# Patient Record
Sex: Female | Born: 1945
Health system: Southern US, Community
[De-identification: ages and names within clinical notes are randomized; demographics above are authoritative.]

## PROBLEM LIST (undated history)

## (undated) DIAGNOSIS — R32 Unspecified urinary incontinence: Secondary | ICD-10-CM

## (undated) DIAGNOSIS — R51 Headache: Secondary | ICD-10-CM

## (undated) DIAGNOSIS — M1990 Unspecified osteoarthritis, unspecified site: Secondary | ICD-10-CM

## (undated) DIAGNOSIS — M8580 Other specified disorders of bone density and structure, unspecified site: Secondary | ICD-10-CM

## (undated) DIAGNOSIS — H269 Unspecified cataract: Secondary | ICD-10-CM

## (undated) DIAGNOSIS — K5780 Diverticulitis of intestine, part unspecified, with perforation and abscess without bleeding: Secondary | ICD-10-CM

## (undated) DIAGNOSIS — K5790 Diverticulosis of intestine, part unspecified, without perforation or abscess without bleeding: Secondary | ICD-10-CM

## (undated) DIAGNOSIS — R2681 Unsteadiness on feet: Secondary | ICD-10-CM

## (undated) DIAGNOSIS — K429 Umbilical hernia without obstruction or gangrene: Secondary | ICD-10-CM

## (undated) HISTORY — DX: Other specified disorders of bone density and structure, unspecified site: M85.80

## (undated) HISTORY — DX: Unspecified urinary incontinence: R32

## (undated) HISTORY — PX: LUMBAR SPINE SURGERY: SHX701

## (undated) HISTORY — PX: CERVICAL SPINE SURGERY: SHX589

## (undated) HISTORY — DX: Unspecified cataract: H26.9

## (undated) HISTORY — DX: Unspecified osteoarthritis, unspecified site: M19.90

## (undated) HISTORY — PX: COLON SURGERY: SHX602

## (undated) HISTORY — DX: Diverticulosis of intestine, part unspecified, without perforation or abscess without bleeding: K57.90

---

## 2000-07-14 ENCOUNTER — Encounter: Payer: Self-pay | Admitting: Internal Medicine

## 2000-07-14 ENCOUNTER — Ambulatory Visit (HOSPITAL_COMMUNITY): Admission: RE | Admit: 2000-07-14 | Discharge: 2000-07-14 | Payer: Self-pay | Admitting: Internal Medicine

## 2001-07-07 ENCOUNTER — Ambulatory Visit (HOSPITAL_COMMUNITY): Admission: RE | Admit: 2001-07-07 | Discharge: 2001-07-07 | Payer: Self-pay | Admitting: Family Medicine

## 2001-07-07 ENCOUNTER — Encounter: Payer: Self-pay | Admitting: Family Medicine

## 2002-07-08 ENCOUNTER — Encounter: Payer: Self-pay | Admitting: Internal Medicine

## 2002-07-08 ENCOUNTER — Ambulatory Visit (HOSPITAL_COMMUNITY): Admission: RE | Admit: 2002-07-08 | Discharge: 2002-07-08 | Payer: Self-pay | Admitting: Internal Medicine

## 2002-08-16 ENCOUNTER — Ambulatory Visit (HOSPITAL_COMMUNITY): Admission: RE | Admit: 2002-08-16 | Discharge: 2002-08-16 | Payer: Self-pay | Admitting: Obstetrics and Gynecology

## 2002-08-16 ENCOUNTER — Encounter: Payer: Self-pay | Admitting: Obstetrics and Gynecology

## 2003-08-23 ENCOUNTER — Ambulatory Visit (HOSPITAL_COMMUNITY): Admission: RE | Admit: 2003-08-23 | Discharge: 2003-08-23 | Payer: Self-pay | Admitting: Obstetrics and Gynecology

## 2004-01-22 DIAGNOSIS — K5790 Diverticulosis of intestine, part unspecified, without perforation or abscess without bleeding: Secondary | ICD-10-CM

## 2004-01-22 HISTORY — DX: Diverticulosis of intestine, part unspecified, without perforation or abscess without bleeding: K57.90

## 2004-08-31 ENCOUNTER — Ambulatory Visit (HOSPITAL_COMMUNITY): Admission: RE | Admit: 2004-08-31 | Discharge: 2004-08-31 | Payer: Self-pay | Admitting: Internal Medicine

## 2004-08-31 ENCOUNTER — Ambulatory Visit: Payer: Self-pay | Admitting: Internal Medicine

## 2004-09-07 ENCOUNTER — Ambulatory Visit (HOSPITAL_COMMUNITY): Admission: RE | Admit: 2004-09-07 | Discharge: 2004-09-07 | Payer: Self-pay | Admitting: Internal Medicine

## 2005-10-18 ENCOUNTER — Ambulatory Visit (HOSPITAL_COMMUNITY): Admission: RE | Admit: 2005-10-18 | Discharge: 2005-10-18 | Payer: Self-pay | Admitting: Obstetrics and Gynecology

## 2006-11-07 ENCOUNTER — Ambulatory Visit (HOSPITAL_COMMUNITY): Admission: RE | Admit: 2006-11-07 | Discharge: 2006-11-07 | Payer: Self-pay | Admitting: Obstetrics and Gynecology

## 2007-11-10 ENCOUNTER — Ambulatory Visit (HOSPITAL_COMMUNITY): Admission: RE | Admit: 2007-11-10 | Discharge: 2007-11-10 | Payer: Self-pay | Admitting: Internal Medicine

## 2008-11-15 ENCOUNTER — Ambulatory Visit (HOSPITAL_COMMUNITY): Admission: RE | Admit: 2008-11-15 | Discharge: 2008-11-15 | Payer: Self-pay | Admitting: Internal Medicine

## 2009-11-16 ENCOUNTER — Ambulatory Visit (HOSPITAL_COMMUNITY): Admission: RE | Admit: 2009-11-16 | Discharge: 2009-11-16 | Payer: Self-pay | Admitting: Obstetrics and Gynecology

## 2010-06-08 NOTE — Op Note (Signed)
NAMESHAMERIA, TRIMARCO                  ACCOUNT NO.:  [id number]   MEDICAL RECORD NO.:  [id number]          PATIENT TYPE:  AMB   LOCATION:  DAY                           FACILITY:  [facility]   PHYSICIAN:  Lionel December, M.D.    DATE OF BIRTH:  03-19-1945   DATE OF PROCEDURE:  08/31/2004  DATE OF DISCHARGE:                                 OPERATIVE REPORT   PROCEDURE:  Colonoscopy.   INDICATION:  Anuja is a 65 year old Caucasian female who is undergoing  screening colonoscopy. Family history is negative for colorectal carcinoma.   Procedure risks were reviewed with the patient, and informed consent was  obtained.   PREMEDICATION:  Demerol 50 mg IV, Versed 7 mg IV in divided dose.   FINDINGS:  Procedure performed in endoscopy suite. The patient's vital signs  and O2 saturation were monitored during the procedure and remained stable.  The patient was placed in left lateral position. Rectal examination  performed. No abnormality noted on external or digital exam. Olympus  videoscope was placed in the rectum and advanced under vision into sigmoid  colon and beyond. Preparation was excellent. Somewhat redundant sigmoid  colon with few small diverticula. The patient had to be turned on her back.  Using abdominal pressure, I was able to pass the scope into splenic flexure  and beyond. Further intubation of the cecum was easy. Cecal landmarks, i.e.  appendiceal orifice, ileocecal valve were well seen and pictures taken for  the record. As the scope was withdrawn, colonic mucosa was examined for the  second time. There was a single diverticulum at ascending colon as well. No  polyps and/or tumor masses were noted. Rectal mucosa was normal. Scope was  retroflexed to examine anorectal junction which was unremarkable. Endoscope  was straightened and withdrawn. The patient tolerated the procedure well.   FINAL DIAGNOSIS:  Few diverticula at sigmoid colon along with one at  ascending colon, otherwise  normal exam.   RECOMMENDATIONS:  1.  High-fiber diet. She may take fiber supplement such as FiberChoice 2      tablets q.d. if she does not need foods rich in fiber.  2.  Yearly Hemoccults.  3.  Screening exam in 10 years.      NR/MEDQ  D:  08/31/2004  T:  08/31/2004  Job:  [id number]

## 2010-12-04 ENCOUNTER — Other Ambulatory Visit (HOSPITAL_COMMUNITY): Payer: Self-pay | Admitting: Internal Medicine

## 2010-12-04 DIAGNOSIS — Z139 Encounter for screening, unspecified: Secondary | ICD-10-CM

## 2010-12-17 ENCOUNTER — Ambulatory Visit (HOSPITAL_COMMUNITY)
Admission: RE | Admit: 2010-12-17 | Discharge: 2010-12-17 | Disposition: A | Discharge status: Home / Self Care | Payer: Medicare Other | Source: Ambulatory Visit | Attending: Internal Medicine | Admitting: Internal Medicine

## 2010-12-17 DIAGNOSIS — Z1231 Encounter for screening mammogram for malignant neoplasm of breast: Secondary | ICD-10-CM | POA: Insufficient documentation

## 2010-12-17 DIAGNOSIS — Z139 Encounter for screening, unspecified: Secondary | ICD-10-CM

## 2010-12-20 ENCOUNTER — Encounter: Payer: Self-pay | Admitting: *Deleted

## 2010-12-20 ENCOUNTER — Ambulatory Visit (INDEPENDENT_AMBULATORY_CARE_PROVIDER_SITE_OTHER): Payer: Medicare Other | Admitting: Gynecology

## 2010-12-20 VITALS — BP 120/74 | Ht 63.5 in | Wt 147.0 lb

## 2010-12-20 DIAGNOSIS — N393 Stress incontinence (female) (male): Secondary | ICD-10-CM

## 2010-12-20 DIAGNOSIS — K5792 Diverticulitis of intestine, part unspecified, without perforation or abscess without bleeding: Secondary | ICD-10-CM | POA: Insufficient documentation

## 2010-12-20 DIAGNOSIS — N952 Postmenopausal atrophic vaginitis: Secondary | ICD-10-CM

## 2010-12-20 DIAGNOSIS — R82998 Other abnormal findings in urine: Secondary | ICD-10-CM

## 2010-12-20 NOTE — Progress Notes (Signed)
Addended byCammie Mcgee T on: 12/20/2010 11:55 AM   Modules accepted: Orders

## 2010-12-20 NOTE — Patient Instructions (Signed)
Follow up on an annual basis

## 2010-12-20 NOTE — Progress Notes (Signed)
Emma Henderson [birth year] [id number]        65 y.o.  for follow up. Former patient of Dr. Leota Sauers. She is complaining of some SUI symptoms.  Past medical history,surgical history, medications, allergies, family history and social history were all reviewed and documented in the EPIC chart. ROS:  Was performed and pertinent positives and negatives are included in the history.  Exam: chaperone present Filed Vitals:   12/20/10 1000  BP: 120/74   General appearance  Normal Skin grossly normal Head/Neck normal with no cervical or supraclavicular adenopathy thyroid normal Lungs  clear Cardiac RR, without RMG Abdominal  soft, nontender, without masses, organomegaly or hernia Breasts  examined lying and sitting without masses, retractions, discharge or axillary adenopathy. Pelvic  Ext/BUS/vagina  normal with atrophic genital changes noted  Cervix  normal  Atrophic in appearance  Uterus  axial, normal size, shape and contour, midline and mobile nontender   Adnexa  Without masses or tenderness    Anus and perineum  normal   Rectovaginal  normal sphincter tone without palpated masses or tenderness.    Assessment/Plan:  65 y.o. female for annual exam.    1. SUI. Patient has mild SUI symptoms. I discussed Kegel exercises and the options for surgery such as sling.  Patient is not interested in surgery she said it's not that bad she will try Kegel exercises. 2. Atrophic vaginal changes. Patient is asymptomatic from this. She is not sexually active and will continue to monitor. 3. Pap smear. Patient has no history of abnormal Pap smears with multiple normal reports in her chart. Last Pap smear December 2011. Discussed current screening recommendations. She is 65 and we can stop altogether versus doing a less frequent screening interval.  We will readdress this on an annual basis but no Pap smear was done today. 4. Breast health. SBE monthly reviewed. Patient had mammography this week and was  reportedly normal. We'll continue with annual mammography. 5. Bone health. Patient had a DEXA over 5 years ago recommended repeat now I gave her an order prescription to have it done at [facility] at her choice.  Increased calcium vitamin D reviewed. 6. Colonoscopy. Patient had colonoscopy about 6 years ago will follow up with gastroenterology in reference to this.  7. Health maintenance. Patient has a primary although has not seen him in some time. I recommended she make an appointment for a primary checkup and for screening lab work per their recommendations. No lab work was done today. Assuming she continues well from a gynecologic standpoint she will see me in a year sooner as needed.   [name], 11:14 AM 12/20/2010

## 2010-12-21 MED ORDER — SULFAMETHOXAZOLE-TRIMETHOPRIM 800-160 MG PO TABS: 1.0000 | ORAL_TABLET | Freq: Two times a day (BID) | ORAL | Status: AC

## 2010-12-21 NOTE — Progress Notes (Signed)
Addended by: Dara Lords on: 12/21/2010 12:27 PM   Modules accepted: Orders

## 2010-12-24 ENCOUNTER — Encounter: Payer: Self-pay | Admitting: Gynecology

## 2011-01-17 ENCOUNTER — Other Ambulatory Visit: Payer: Self-pay | Admitting: Gynecology

## 2011-01-17 DIAGNOSIS — Z780 Asymptomatic menopausal state: Secondary | ICD-10-CM

## 2011-01-23 ENCOUNTER — Ambulatory Visit (HOSPITAL_COMMUNITY)
Admission: RE | Admit: 2011-01-23 | Discharge: 2011-01-23 | Disposition: A | Discharge status: Home / Self Care | Payer: Medicare Other | Source: Ambulatory Visit | Attending: Gynecology | Admitting: Gynecology

## 2011-01-23 DIAGNOSIS — Z780 Asymptomatic menopausal state: Secondary | ICD-10-CM

## 2011-01-23 DIAGNOSIS — M949 Disorder of cartilage, unspecified: Secondary | ICD-10-CM | POA: Insufficient documentation

## 2011-01-23 DIAGNOSIS — M899 Disorder of bone, unspecified: Secondary | ICD-10-CM | POA: Insufficient documentation

## 2011-01-28 ENCOUNTER — Encounter: Payer: Self-pay | Admitting: Gynecology

## 2011-02-06 ENCOUNTER — Encounter: Payer: Self-pay | Admitting: Gynecology

## 2011-10-31 ENCOUNTER — Other Ambulatory Visit (HOSPITAL_COMMUNITY): Payer: Self-pay | Admitting: Internal Medicine

## 2011-10-31 DIAGNOSIS — R2689 Other abnormalities of gait and mobility: Secondary | ICD-10-CM

## 2011-10-31 DIAGNOSIS — W19XXXA Unspecified fall, initial encounter: Secondary | ICD-10-CM

## 2011-11-04 ENCOUNTER — Ambulatory Visit (HOSPITAL_COMMUNITY)
Admission: RE | Admit: 2011-11-04 | Discharge: 2011-11-04 | Disposition: A | Discharge status: Home / Self Care | Payer: Medicare Other | Source: Ambulatory Visit | Attending: Internal Medicine | Admitting: Internal Medicine

## 2011-11-04 DIAGNOSIS — W19XXXA Unspecified fall, initial encounter: Secondary | ICD-10-CM

## 2011-11-04 DIAGNOSIS — R2689 Other abnormalities of gait and mobility: Secondary | ICD-10-CM

## 2011-11-04 DIAGNOSIS — R269 Unspecified abnormalities of gait and mobility: Secondary | ICD-10-CM | POA: Insufficient documentation

## 2011-11-04 DIAGNOSIS — R42 Dizziness and giddiness: Secondary | ICD-10-CM | POA: Insufficient documentation

## 2011-11-04 DIAGNOSIS — Z9181 History of falling: Secondary | ICD-10-CM | POA: Insufficient documentation

## 2011-11-25 ENCOUNTER — Other Ambulatory Visit (HOSPITAL_COMMUNITY): Payer: Self-pay | Admitting: Internal Medicine

## 2011-11-25 DIAGNOSIS — Z139 Encounter for screening, unspecified: Secondary | ICD-10-CM

## 2011-12-23 ENCOUNTER — Ambulatory Visit (HOSPITAL_COMMUNITY)
Admission: RE | Admit: 2011-12-23 | Discharge: 2011-12-23 | Disposition: A | Discharge status: Home / Self Care | Payer: Medicare Other | Source: Ambulatory Visit | Attending: Internal Medicine | Admitting: Internal Medicine

## 2011-12-23 DIAGNOSIS — Z1231 Encounter for screening mammogram for malignant neoplasm of breast: Secondary | ICD-10-CM | POA: Insufficient documentation

## 2011-12-23 DIAGNOSIS — Z139 Encounter for screening, unspecified: Secondary | ICD-10-CM

## 2012-02-04 ENCOUNTER — Encounter: Payer: Self-pay | Admitting: Gynecology

## 2012-02-04 ENCOUNTER — Ambulatory Visit (INDEPENDENT_AMBULATORY_CARE_PROVIDER_SITE_OTHER): Payer: Medicare Other | Admitting: Gynecology

## 2012-02-04 DIAGNOSIS — N898 Other specified noninflammatory disorders of vagina: Secondary | ICD-10-CM

## 2012-02-04 DIAGNOSIS — L293 Anogenital pruritus, unspecified: Secondary | ICD-10-CM

## 2012-02-04 DIAGNOSIS — L292 Pruritus vulvae: Secondary | ICD-10-CM

## 2012-02-04 DIAGNOSIS — R300 Dysuria: Secondary | ICD-10-CM

## 2012-02-04 LAB — URINALYSIS W MICROSCOPIC + REFLEX CULTURE
Bilirubin Urine: NEGATIVE
Casts: NONE SEEN
Crystals: NONE SEEN
Hgb urine dipstick: NEGATIVE
Ketones, ur: NEGATIVE mg/dL
Nitrite: NEGATIVE
Specific Gravity, Urine: >1.03 — ABNORMAL HIGH (ref 1.005–1.030)
pH: 5 (ref 5.0–8.0)

## 2012-02-04 LAB — WET PREP FOR TRICH, YEAST, CLUE: Yeast Wet Prep HPF POC: NONE SEEN

## 2012-02-04 MED ORDER — NITROFURANTOIN MONOHYD MACRO 100 MG PO CAPS: 100.0000 mg | ORAL_CAPSULE | Freq: Two times a day (BID) | ORAL | Status: DC

## 2012-02-04 MED ORDER — FLUCONAZOLE 150 MG PO TABS: 150.0000 mg | ORAL_TABLET | Freq: Once | ORAL | Status: DC

## 2012-02-04 NOTE — Patient Instructions (Signed)
Take antibiotics as prescribed.

## 2012-02-04 NOTE — Progress Notes (Signed)
Patient presents with some slight burning and frequency with urination. Also some slight vaginal itching. Does have history of UTIs in the past. Also notes some urgency symptoms and stress urinary incontinence type symptoms.  Exam with kim assistant Abdomen soft nontender without masses guarding rebound organomegaly. Pelvic external BUS vagina with atrophic changes. Mild cystocele noted. Cervix normal with mild to sent. Uterus grossly normal size midline mobile nontender. Adnexa without masses or tenderness.  Assessment and plan: 1. Slight UTI symptoms. Urinalysis consistent with mild UTI. Had been treated previously at primary with 5 day course of antibiotics. We'll treat with Macrobid 100 mg twice a day x7 days. Follow up at her scheduled annual exam in 2 weeks. 2. Vaginal itching.  Wet prep unremarkable. Will cover with Diflucan 150 mg x1 dose. Follow up if symptoms persist or recur otherwise at her annual exam in 2 weeks.

## 2012-02-06 LAB — URINE CULTURE: Colony Count: 6000

## 2012-02-20 ENCOUNTER — Encounter: Payer: Self-pay | Admitting: Gynecology

## 2012-02-20 ENCOUNTER — Ambulatory Visit (INDEPENDENT_AMBULATORY_CARE_PROVIDER_SITE_OTHER): Payer: Medicare Other | Admitting: Gynecology

## 2012-02-20 ENCOUNTER — Other Ambulatory Visit (HOSPITAL_COMMUNITY)
Admission: RE | Admit: 2012-02-20 | Discharge: 2012-02-20 | Disposition: A | Discharge status: Home / Self Care | Payer: Medicare Other | Source: Ambulatory Visit | Attending: Gynecology | Admitting: Gynecology

## 2012-02-20 VITALS — BP 120/78 | Ht 63.0 in | Wt 148.0 lb

## 2012-02-20 DIAGNOSIS — M899 Disorder of bone, unspecified: Secondary | ICD-10-CM

## 2012-02-20 DIAGNOSIS — Z124 Encounter for screening for malignant neoplasm of cervix: Secondary | ICD-10-CM

## 2012-02-20 DIAGNOSIS — M8580 Other specified disorders of bone density and structure, unspecified site: Secondary | ICD-10-CM

## 2012-02-20 DIAGNOSIS — N952 Postmenopausal atrophic vaginitis: Secondary | ICD-10-CM

## 2012-02-20 DIAGNOSIS — Z01419 Encounter for gynecological examination (general) (routine) without abnormal findings: Secondary | ICD-10-CM | POA: Insufficient documentation

## 2012-02-20 DIAGNOSIS — Z1151 Encounter for screening for human papillomavirus (HPV): Secondary | ICD-10-CM | POA: Insufficient documentation

## 2012-02-20 NOTE — Patient Instructions (Signed)
Follow-up in one year.

## 2012-02-20 NOTE — Progress Notes (Signed)
Emma Henderson [birth year] [id number]        67 y.o.  [id number] for follow up exam.  Several issues noted below.  Past medical history,surgical history, medications, allergies, family history and social history were all reviewed and documented in the EPIC chart. ROS:  Was performed and pertinent positives and negatives are included in the history.  Exam: Kim assistant Filed Vitals:   02/20/12 1109  BP: 120/78  Height: 5\' 3"  (1.6 m)  Weight: 148 lb (67.132 kg)   General appearance  Normal Skin grossly normal Head/Neck normal with no cervical or supraclavicular adenopathy thyroid normal Lungs  clear Cardiac RR, without RMG Abdominal  soft, nontender, without masses, organomegaly or hernia Breasts  examined lying and sitting without masses, retractions, discharge or axillary adenopathy. Pelvic  Ext/BUS/vagina  normal with atrophic changes  Cervix  normal stenotic os with atrophic changes. Pap/HPV  Uterus  anteverted, normal size, shape and contour, midline and mobile nontender   Adnexa  Without masses or tenderness    Anus and perineum  normal   Rectovaginal  normal sphincter tone without palpated masses or tenderness.    Assessment/Plan:  67 y.o. [id number] female for follow up exam.   1. Postmenopausal/atrophic genital changes. Patient without significant symptoms to include hot flashes sweats or vaginal symptoms. No bleeding at all. We'll continue to monitor. She knows to report any bleeding or other symptoms. 2. Osteopenia. DEXA 01/2011 with T score -1.3 FRAX 8.6%/0.8%. Reviewed calcium/vitamin D recommendations. Increase weightbearing exercise. Repeat DEXA next year at two-year interval. 3. Mammography 12/2011. Continue with annual mammography. SBE monthly reviewed. 4. Pap smear 2011. Pap/HPV today. No history of abnormal Pap smears. Discussed option to stop screening all together if this is normal as she is at age 48 versus less frequent screening interval and we will readdress on an  annual basis. 5. Colonoscopy 7 years ago. Due to repeat a 10 year interval. 6. Health maintenance. No blood work done as it is all done through her primary physician's office who she sees her on a regular basis.    [name], 11:58 AM 02/20/2012

## 2012-02-21 LAB — URINALYSIS W MICROSCOPIC + REFLEX CULTURE
Crystals: NONE SEEN
Glucose, UA: NEGATIVE mg/dL
Ketones, ur: NEGATIVE mg/dL
Protein, ur: NEGATIVE mg/dL

## 2012-06-19 ENCOUNTER — Emergency Department (HOSPITAL_COMMUNITY): Payer: Medicare Other

## 2012-06-19 ENCOUNTER — Encounter (HOSPITAL_COMMUNITY): Payer: Self-pay | Admitting: *Deleted

## 2012-06-19 ENCOUNTER — Inpatient Hospital Stay (HOSPITAL_COMMUNITY)
Admission: EM | Admit: 2012-06-19 | Discharge: 2012-06-23 | DRG: 392 | Disposition: A | Discharge status: Home / Self Care | Payer: Medicare Other | Attending: General Surgery | Admitting: General Surgery

## 2012-06-19 DIAGNOSIS — K630 Abscess of intestine: Secondary | ICD-10-CM | POA: Diagnosis present

## 2012-06-19 DIAGNOSIS — M899 Disorder of bone, unspecified: Secondary | ICD-10-CM | POA: Diagnosis present

## 2012-06-19 DIAGNOSIS — D72829 Elevated white blood cell count, unspecified: Secondary | ICD-10-CM | POA: Diagnosis present

## 2012-06-19 DIAGNOSIS — K5732 Diverticulitis of large intestine without perforation or abscess without bleeding: Principal | ICD-10-CM | POA: Diagnosis present

## 2012-06-19 DIAGNOSIS — Z87891 Personal history of nicotine dependence: Secondary | ICD-10-CM

## 2012-06-19 DIAGNOSIS — Z823 Family history of stroke: Secondary | ICD-10-CM

## 2012-06-19 DIAGNOSIS — K5780 Diverticulitis of intestine, part unspecified, with perforation and abscess without bleeding: Secondary | ICD-10-CM

## 2012-06-19 DIAGNOSIS — Z8249 Family history of ischemic heart disease and other diseases of the circulatory system: Secondary | ICD-10-CM

## 2012-06-19 DIAGNOSIS — K429 Umbilical hernia without obstruction or gangrene: Secondary | ICD-10-CM | POA: Diagnosis present

## 2012-06-19 LAB — COMPREHENSIVE METABOLIC PANEL
ALT: 12 U/L (ref 0–35)
AST: 16 U/L (ref 0–37)
Albumin: 3.6 g/dL (ref 3.5–5.2)
CO2: 24 mEq/L (ref 19–32)
Calcium: 9.4 mg/dL (ref 8.4–10.5)
Chloride: 102 mEq/L (ref 96–112)
Creatinine, Ser: 0.94 mg/dL (ref 0.50–1.10)
GFR calc Af Amer: 72 mL/min — ABNORMAL LOW (ref >90)
GFR calc non Af Amer: 62 mL/min — ABNORMAL LOW (ref >90)
Sodium: 139 mEq/L (ref 135–145)
Total Protein: 7.5 g/dL (ref 6.0–8.3)

## 2012-06-19 LAB — URINALYSIS, ROUTINE W REFLEX MICROSCOPIC
Ketones, ur: NEGATIVE mg/dL
Specific Gravity, Urine: >1.03 — ABNORMAL HIGH (ref 1.005–1.030)
pH: 6 (ref 5.0–8.0)

## 2012-06-19 LAB — CBC
Hemoglobin: 12.8 g/dL (ref 12.0–15.0)
MCH: 28.8 pg (ref 26.0–34.0)
MCHC: 33.3 g/dL (ref 30.0–36.0)
MCV: 86.3 fL (ref 78.0–100.0)
RDW: 14 % (ref 11.5–15.5)

## 2012-06-19 LAB — URINE MICROSCOPIC-ADD ON

## 2012-06-19 MED ORDER — FENTANYL CITRATE 0.05 MG/ML IJ SOLN
50.0000 ug | Freq: Once | INTRAMUSCULAR | Status: AC
  Administered 2012-06-19: 50 ug via INTRAVENOUS
  Filled 2012-06-19: qty 2

## 2012-06-19 MED ORDER — IOHEXOL 300 MG/ML  SOLN: 100.0000 mL | Freq: Once | INTRAMUSCULAR | Status: AC | PRN

## 2012-06-19 MED ORDER — ONDANSETRON HCL 4 MG/2ML IJ SOLN
4.0000 mg | Freq: Once | INTRAMUSCULAR | Status: AC
  Administered 2012-06-19: 4 mg via INTRAVENOUS
  Filled 2012-06-19: qty 2

## 2012-06-19 MED ORDER — IOHEXOL 300 MG/ML  SOLN
50.0000 mL | Freq: Once | INTRAMUSCULAR | Status: AC | PRN
  Administered 2012-06-19: 50 mL via ORAL

## 2012-06-19 MED ORDER — ACETAMINOPHEN 325 MG PO TABS
650.0000 mg | ORAL_TABLET | Freq: Once | ORAL | Status: AC
  Administered 2012-06-19: 650 mg via ORAL
  Filled 2012-06-19: qty 2

## 2012-06-19 MED ORDER — SODIUM CHLORIDE 0.9 % IV SOLN
INTRAVENOUS | Status: DC
  Administered 2012-06-19: 23:00:00 via INTRAVENOUS

## 2012-06-19 NOTE — ED Provider Notes (Signed)
History  This chart was scribed for Sunnie Nielsen, MD, by Candelaria Stagers, ED Scribe. This patient was seen in room [facility] and the patient's care was started at 11:10 PM   CSN: [id number]  Arrival date & time 06/19/12  2235   First MD Initiated Contact with Patient 06/19/12 2256      Chief Complaint  Patient presents with  . Abdominal Pain    The history is provided by the patient. No language interpreter was used.   HPI Comments: Emma Henderson is a 67 y.o. female who presents to the Emergency Department complaining of diffuse abdominal pain that started two days ago and has gradually worsened.  She denies nausea, vomiting, SOB, or chest pain.  She has also experienced diarrhea, fever, and decreased urination.   Her fever in the ED is 100.6.  Pt denies recent travel.  She has taken tylenol with some relief.      Past Medical History  Diagnosis Date  . Diverticulitis 2006  . Osteopenia 01/2011    t-score -1.3, FRAX 8.6 % & 0.8%    Past Surgical History  Procedure Laterality Date  . Cervical spine surgery    . Lumbar spine surgery      Family History  Problem Relation Age of Onset  . Heart disease Mother     heart attacks died with second one  . Stroke Father     died of a stroke  . Heart disease Father     had two MI's    History  Substance Use Topics  . Smoking status: Former Games developer  . Smokeless tobacco: Never Used     Comment: stopped at age 60  . Alcohol Use: 3.5 oz/week    7 drink(s) per week    OB History   Grav Para Term Preterm Abortions TAB SAB Ect Mult Living   2 2 2       2       Review of Systems  Constitutional: Positive for fever.  Respiratory: Negative for shortness of breath.   Cardiovascular: Negative for chest pain.  Gastrointestinal: Positive for abdominal pain and diarrhea. Negative for nausea, vomiting and blood in stool.  All other systems reviewed and are negative.    Allergies  Review of patient's allergies indicates no known  allergies.  Home Medications   Current Outpatient Rx  Name  Route  Sig  Dispense  Refill  . acetaminophen (TYLENOL) 500 MG tablet   Oral   Take 500 mg by mouth every 6 (six) hours as needed for pain.         . Calcium Carbonate-Vitamin D (CALCARB 600/D PO)   Oral   Take 1 tablet by mouth daily.           BP 142/66  Pulse 117  Temp(Src) 100.6 F (38.1 C) (Oral)  Resp 16  Ht 5\' 3"  (1.6 m)  Wt 150 lb (68.04 kg)  BMI 26.58 kg/m2  SpO2 99%  LMP 01/21/1998  Physical Exam  Nursing note and vitals reviewed. Constitutional: She is oriented to person, place, and time. She appears well-developed and well-nourished. No distress.  HENT:  Head: Normocephalic and atraumatic.  Dry mucous membranes  Eyes: EOM are normal.  Neck: Neck supple. No tracheal deviation present.  Cardiovascular:  tachycardic  Pulmonary/Chest: Effort normal. No respiratory distress.  Abdominal: Soft. There is tenderness (LLQ tenderness and mid abdomen).  Musculoskeletal: Normal range of motion.  Neurological: She is alert and oriented to person, place, and time.  Skin: Skin is warm and dry. She is not diaphoretic.  Psychiatric: She has a normal mood and affect. Her behavior is normal.    ED Course  Procedures   DIAGNOSTIC STUDIES: Oxygen Saturation is 99% on room air, normal by my interpretation.    COORDINATION OF CARE:  11:14 PM Discussed course of care with pt which includes abdomen CT and pain medication.  Pt understands and agrees.  Results for orders placed during the hospital encounter of 06/19/12  URINALYSIS, ROUTINE W REFLEX MICROSCOPIC      Result Value Range   Color, Urine YELLOW  YELLOW   APPearance HAZY (*) CLEAR   Specific Gravity, Urine >1.030 (*) 1.005 - 1.030   pH 6.0  5.0 - 8.0   Glucose, UA NEGATIVE  NEGATIVE mg/dL   Hgb urine dipstick MODERATE (*) NEGATIVE   Bilirubin Urine SMALL (*) NEGATIVE   Ketones, ur NEGATIVE  NEGATIVE mg/dL   Protein, ur TRACE (*) NEGATIVE mg/dL    Urobilinogen, UA 0.2  0.0 - 1.0 mg/dL   Nitrite NEGATIVE  NEGATIVE   Leukocytes, UA TRACE (*) NEGATIVE  CBC      Result Value Range   WBC 11.8 (*) 4.0 - 10.5 K/uL   RBC 4.45  3.87 - 5.11 MIL/uL   Hemoglobin 12.8  12.0 - 15.0 g/dL   HCT 47.8  29.5 - 62.1 %   MCV 86.3  78.0 - 100.0 fL   MCH 28.8  26.0 - 34.0 pg   MCHC 33.3  30.0 - 36.0 g/dL   RDW 30.8  65.7 - 84.6 %   Platelets 253  150 - 400 K/uL  COMPREHENSIVE METABOLIC PANEL      Result Value Range   Sodium 139  135 - 145 mEq/L   Potassium 3.3 (*) 3.5 - 5.1 mEq/L   Chloride 102  96 - 112 mEq/L   CO2 24  19 - 32 mEq/L   Glucose, Bld 178 (*) 70 - 99 mg/dL   BUN 13  6 - 23 mg/dL   Creatinine, Ser 9.62  0.50 - 1.10 mg/dL   Calcium 9.4  8.4 - 95.2 mg/dL   Total Protein 7.5  6.0 - 8.3 g/dL   Albumin 3.6  3.5 - 5.2 g/dL   AST 16  0 - 37 U/L   ALT 12  0 - 35 U/L   Alkaline Phosphatase 73  39 - 117 U/L   Total Bilirubin 0.8  0.3 - 1.2 mg/dL   GFR calc non Af Amer 62 (*) >90 mL/min   GFR calc Af Amer 72 (*) >90 mL/min  LIPASE, BLOOD      Result Value Range   Lipase 20  11 - 59 U/L  URINE MICROSCOPIC-ADD ON      Result Value Range   Squamous Epithelial / LPF FEW (*) RARE   WBC, UA 21-50  <3 WBC/hpf   Bacteria, UA FEW (*) RARE   Ct Abdomen Pelvis W Contrast  06/20/2012   *RADIOLOGY REPORT*  Clinical Data: Left lower quadrant pain.  Fever.  CT ABDOMEN AND PELVIS WITH CONTRAST  Technique:  Multidetector CT imaging of the abdomen and pelvis was performed following the standard protocol during bolus administration of intravenous contrast.  Contrast: 50mL OMNIPAQUE IOHEXOL 300 MG/ML  SOLN, OMNIPAQUE IOHEXOL 300 MG/ML  SOLN  Comparison: None.  Findings: A small amount of free intraperitoneal air is seen.  Mild diverticulitis is seen involving the mid to distal sigmoid colon. There is an extraluminal  fluid and gas collection in the right pelvis measuring 2.9 x 4.3 cm, consistent with a diverticular abscess.  Normal appendix is  visualized.  No other abscess identified.  The liver, gallbladder, pancreas, spleen, adrenal glands, and kidneys are normal in appearance.  No evidence of hydronephrosis. No soft tissue masses or lymphadenopathy identified.  IMPRESSION:  1.  Sigmoid diverticulitis, with diverticular abscess in the right pelvis measuring 4.3 cm. 2.  Free intraperitoneal air, consistent with perforated diverticulitis.  Critical Value/emergent results were called by telephone at the time of interpretation on 06/20/2012 at 0130 hours to Dr. Dierdre Highman in the emergency department, who verbally acknowledged these results.   Original Report Authenticated By: Myles Rosenthal, M.D.   IV fentanyl.   1:32 AM CT results d/w RAD, [facility] consulted. NPO IVFs.   DR Leticia Penna to admit, recs IVF bolus, IV Zosyn, possible OR in am MDM  ABD pain/ fever with perforated diverticulitis/ 4cm abscess  Labs, CT, IVfs, IV narcotics.  [facility] admit   I personally performed the services described in this documentation, which was scribed in my presence. The recorded information has been reviewed and is accurate.         Sunnie Nielsen, MD 06/20/12 0200

## 2012-06-19 NOTE — ED Notes (Signed)
abd pain, nausea,no vomiting, sl diarrhea. Alert, NAD

## 2012-06-20 MED ORDER — PIPERACILLIN-TAZOBACTAM 3.375 G IVPB
3.3750 g | Freq: Three times a day (TID) | INTRAVENOUS | Status: DC
  Administered 2012-06-20 – 2012-06-23 (×10): 3.375 g via INTRAVENOUS
  Filled 2012-06-20 (×14): qty 50

## 2012-06-20 MED ORDER — LACTATED RINGERS IV SOLN
INTRAVENOUS | Status: DC
  Administered 2012-06-20 – 2012-06-23 (×5): via INTRAVENOUS

## 2012-06-20 MED ORDER — PIPERACILLIN-TAZOBACTAM 3.375 G IVPB
3.3750 g | Freq: Once | INTRAVENOUS | Status: AC
  Administered 2012-06-20: 3.375 g via INTRAVENOUS
  Filled 2012-06-20: qty 50

## 2012-06-20 MED ORDER — PANTOPRAZOLE SODIUM 40 MG IV SOLR
40.0000 mg | Freq: Every day | INTRAVENOUS | Status: DC
  Administered 2012-06-21 – 2012-06-22 (×3): 40 mg via INTRAVENOUS
  Filled 2012-06-20 (×3): qty 40

## 2012-06-20 MED ORDER — POTASSIUM CHLORIDE CRYS ER 20 MEQ PO TBCR
40.0000 meq | EXTENDED_RELEASE_TABLET | Freq: Once | ORAL | Status: AC
  Administered 2012-06-20: 40 meq via ORAL
  Filled 2012-06-20: qty 2

## 2012-06-20 MED ORDER — SODIUM CHLORIDE 0.9 % IV SOLN: INTRAVENOUS | Status: DC

## 2012-06-20 MED ORDER — ONDANSETRON HCL 4 MG/2ML IJ SOLN: 4.0000 mg | Freq: Three times a day (TID) | INTRAMUSCULAR | Status: DC | PRN

## 2012-06-20 MED ORDER — ONDANSETRON HCL 4 MG/2ML IJ SOLN
4.0000 mg | Freq: Four times a day (QID) | INTRAMUSCULAR | Status: DC | PRN
  Administered 2012-06-20 – 2012-06-21 (×2): 4 mg via INTRAVENOUS
  Filled 2012-06-20 (×2): qty 2

## 2012-06-20 MED ORDER — IOHEXOL 300 MG/ML  SOLN
100.0000 mL | Freq: Once | INTRAMUSCULAR | Status: AC | PRN
  Administered 2012-06-20: 100 mL via INTRAVENOUS

## 2012-06-20 MED ORDER — ENOXAPARIN SODIUM 40 MG/0.4ML ~~LOC~~ SOLN
40.0000 mg | SUBCUTANEOUS | Status: DC
Start: 2012-06-20 — End: 2012-06-23
  Administered 2012-06-20 – 2012-06-22 (×3): 40 mg via SUBCUTANEOUS
  Filled 2012-06-20 (×4): qty 0.4

## 2012-06-20 MED ORDER — SODIUM CHLORIDE 0.9 % IV SOLN: INTRAVENOUS | Status: AC

## 2012-06-20 MED ORDER — SODIUM CHLORIDE 0.9 % IV BOLUS (SEPSIS)
1000.0000 mL | Freq: Once | INTRAVENOUS | Status: AC
  Administered 2012-06-20: 1000 mL via INTRAVENOUS

## 2012-06-20 MED ORDER — FENTANYL CITRATE 0.05 MG/ML IJ SOLN
50.0000 ug | Freq: Once | INTRAMUSCULAR | Status: AC
  Administered 2012-06-20: 50 ug via INTRAVENOUS
  Filled 2012-06-20: qty 2

## 2012-06-20 MED ORDER — HYDROMORPHONE HCL PF 1 MG/ML IJ SOLN
1.0000 mg | INTRAMUSCULAR | Status: DC | PRN
  Administered 2012-06-20: 1 mg via INTRAVENOUS
  Filled 2012-06-20: qty 1

## 2012-06-20 MED ORDER — HYDROMORPHONE HCL PF 1 MG/ML IJ SOLN
1.0000 mg | INTRAMUSCULAR | Status: DC | PRN
  Administered 2012-06-20 – 2012-06-22 (×6): 1 mg via INTRAVENOUS
  Filled 2012-06-20: qty 2
  Filled 2012-06-20 (×5): qty 1

## 2012-06-20 MED ORDER — CIPROFLOXACIN HCL 250 MG PO TABS
500.0000 mg | ORAL_TABLET | Freq: Once | ORAL | Status: AC
  Administered 2012-06-20: 500 mg via ORAL
  Filled 2012-06-20: qty 2

## 2012-06-20 NOTE — H&P (Signed)
Emma Henderson is an 67 y.o. female.   Chief Complaint: Abdominal pain HPI: Patient presented to [facility] emergency department with several days of increasing left lower quadrant suprapubic abdominal pain. She states she had similar symptomatology in the past with an episode of diverticulitis. She's never had this severe pain. She has had some associated nausea but no emesis. She has had associated fevers and chills. She denies any significant change bowel movements although does state she's had some diarrhea. No melena or hematochezia. No sick contacts. No change in urination.  Past Medical History  Diagnosis Date  . Diverticulitis 2006  . Osteopenia 01/2011    t-score -1.3, FRAX 8.6 % & 0.8%    Past Surgical History  Procedure Laterality Date  . Cervical spine surgery    . Lumbar spine surgery      Family History  Problem Relation Age of Onset  . Heart disease Mother     heart attacks died with second one  . Stroke Father     died of a stroke  . Heart disease Father     had two MI's   Social History:  reports that she has quit smoking. She has never used smokeless tobacco. She reports that she drinks about 3.5 ounces of alcohol per week. She reports that she does not use illicit drugs.  Allergies: No Known Allergies  Medications Prior to Admission  Medication Sig Dispense Refill  . acetaminophen (TYLENOL) 500 MG tablet Take 500 mg by mouth every 6 (six) hours as needed for pain.      . Calcium Carbonate-Vitamin D (CALCARB 600/D PO) Take 1 tablet by mouth daily.        Results for orders placed during the hospital encounter of 06/19/12 (from the past 48 hour(s))  URINALYSIS, ROUTINE W REFLEX MICROSCOPIC     Status: Abnormal   Collection Time    06/19/12 11:00 PM      Result Value Range   Color, Urine YELLOW  YELLOW   APPearance HAZY (*) CLEAR   Specific Gravity, Urine >1.030 (*) 1.005 - 1.030   pH 6.0  5.0 - 8.0   Glucose, UA NEGATIVE  NEGATIVE mg/dL   Hgb urine  dipstick MODERATE (*) NEGATIVE   Bilirubin Urine SMALL (*) NEGATIVE   Ketones, ur NEGATIVE  NEGATIVE mg/dL   Protein, ur TRACE (*) NEGATIVE mg/dL   Urobilinogen, UA 0.2  0.0 - 1.0 mg/dL   Nitrite NEGATIVE  NEGATIVE   Leukocytes, UA TRACE (*) NEGATIVE  URINE MICROSCOPIC-ADD ON     Status: Abnormal   Collection Time    06/19/12 11:00 PM      Result Value Range   Squamous Epithelial / LPF FEW (*) RARE   Comment: FEW   WBC, UA 21-50  <3 WBC/hpf   Comment: 21-50   Bacteria, UA FEW (*) RARE   Comment: FEW  CBC     Status: Abnormal   Collection Time    06/19/12 11:22 PM      Result Value Range   WBC 11.8 (*) 4.0 - 10.5 K/uL   RBC 4.45  3.87 - 5.11 MIL/uL   Hemoglobin 12.8  12.0 - 15.0 g/dL   HCT 57.8  46.9 - 62.9 %   MCV 86.3  78.0 - 100.0 fL   MCH 28.8  26.0 - 34.0 pg   MCHC 33.3  30.0 - 36.0 g/dL   RDW 52.8  41.3 - 24.4 %   Platelets 253  150 - 400  K/uL  COMPREHENSIVE METABOLIC PANEL     Status: Abnormal   Collection Time    06/19/12 11:22 PM      Result Value Range   Sodium 139  135 - 145 mEq/L   Potassium 3.3 (*) 3.5 - 5.1 mEq/L   Chloride 102  96 - 112 mEq/L   CO2 24  19 - 32 mEq/L   Glucose, Bld 178 (*) 70 - 99 mg/dL   BUN 13  6 - 23 mg/dL   Creatinine, Ser 4.40  0.50 - 1.10 mg/dL   Calcium 9.4  8.4 - 34.7 mg/dL   Total Protein 7.5  6.0 - 8.3 g/dL   Albumin 3.6  3.5 - 5.2 g/dL   AST 16  0 - 37 U/L   ALT 12  0 - 35 U/L   Alkaline Phosphatase 73  39 - 117 U/L   Total Bilirubin 0.8  0.3 - 1.2 mg/dL   GFR calc non Af Amer 62 (*) >90 mL/min   GFR calc Af Amer 72 (*) >90 mL/min   Comment:            The eGFR has been calculated     using the CKD EPI equation.     This calculation has not been     validated in all clinical     situations.     eGFR's persistently     <90 mL/min signify     possible Chronic Kidney Disease.  LIPASE, BLOOD     Status: None   Collection Time    06/19/12 11:22 PM      Result Value Range   Lipase 20  11 - 59 U/L   Ct Abdomen Pelvis W  Contrast  06/20/2012   *RADIOLOGY REPORT*  Clinical Data: Left lower quadrant pain.  Fever.  CT ABDOMEN AND PELVIS WITH CONTRAST  Technique:  Multidetector CT imaging of the abdomen and pelvis was performed following the standard protocol during bolus administration of intravenous contrast.  Contrast: 50mL OMNIPAQUE IOHEXOL 300 MG/ML  SOLN, OMNIPAQUE IOHEXOL 300 MG/ML  SOLN  Comparison: None.  Findings: A small amount of free intraperitoneal air is seen.  Mild diverticulitis is seen involving the mid to distal sigmoid colon. There is an extraluminal fluid and gas collection in the right pelvis measuring 2.9 x 4.3 cm, consistent with a diverticular abscess.  Normal appendix is visualized.  No other abscess identified.  The liver, gallbladder, pancreas, spleen, adrenal glands, and kidneys are normal in appearance.  No evidence of hydronephrosis. No soft tissue masses or lymphadenopathy identified.  IMPRESSION:  1.  Sigmoid diverticulitis, with diverticular abscess in the right pelvis measuring 4.3 cm. 2.  Free intraperitoneal air, consistent with perforated diverticulitis.  Critical Value/emergent results were called by telephone at the time of interpretation on 06/20/2012 at 0130 hours to Dr. Dierdre Highman in the emergency department, who verbally acknowledged these results.   Original Report Authenticated By: Myles Rosenthal, M.D.    Review of Systems  Constitutional: Positive for fever, chills and diaphoresis. Negative for weight loss.  HENT: Negative.   Eyes: Negative.   Respiratory: Negative.   Cardiovascular: Negative.   Gastrointestinal: Positive for nausea, abdominal pain (Left lower quadrant) and diarrhea. Negative for vomiting, constipation, blood in stool and melena.  Genitourinary: Negative.   Musculoskeletal: Negative.   Skin: Negative.   Neurological: Negative.   Endo/Heme/Allergies: Negative.   Psychiatric/Behavioral: Negative.     Blood pressure 136/73, pulse 89, temperature 99.2 F (37.3  C), temperature  source Oral, resp. rate 18, height 5\' 3"  (1.6 m), weight 68.04 kg (150 lb), last menstrual period 01/21/1998, SpO2 97.00%. Physical Exam  Constitutional: She is oriented to person, place, and time. She appears well-developed and well-nourished. No distress.  HENT:  Head: Normocephalic and atraumatic.  Eyes: EOM are normal. Pupils are equal, round, and reactive to light. No scleral icterus.  Neck: Normal range of motion. Neck supple. No tracheal deviation present. No thyromegaly present.  Cardiovascular: Normal rate, regular rhythm and normal heart sounds.   Respiratory: Effort normal and breath sounds normal. No respiratory distress.  GI: Soft. She exhibits no distension and no mass. There is tenderness (suprapubic and left lower quadrant.). There is no rebound and no guarding.  Lymphadenopathy:    She has no cervical adenopathy.  Neurological: She is alert and oriented to person, place, and time.  Skin: Skin is warm and dry.     Assessment/Plan Perforated diverticulitis. CT findings were discussed at length the patient. Clinically her exam is reassuring and I have discussed with the patient surgical indications and options. At this time we'll continue bowel rest and continue IV fluid hydration and IV antibiotics. Should she continue to progress in a negative direction I have discussed with the patient surgical intervention with planned sigmoid colectomy and Hartman's procedure. We also discussed continued conservative treatment and patient understands indications to proceed to the operating room. This out I will continue to closely monitor patient. Patient and family express understanding of current treatment plan and are in agreement.  Arlyn Bumpus C 06/20/2012, 2:08 PM

## 2012-06-21 LAB — URINE CULTURE

## 2012-06-21 LAB — BASIC METABOLIC PANEL
GFR calc Af Amer: 83 mL/min — ABNORMAL LOW (ref >90)
GFR calc non Af Amer: 72 mL/min — ABNORMAL LOW (ref >90)
Potassium: 3.3 mEq/L — ABNORMAL LOW (ref 3.5–5.1)
Sodium: 136 mEq/L (ref 135–145)

## 2012-06-21 LAB — CBC
MCHC: 32.7 g/dL (ref 30.0–36.0)
Platelets: 241 10*3/uL (ref 150–400)
RDW: 14 % (ref 11.5–15.5)
WBC: 10.3 10*3/uL (ref 4.0–10.5)

## 2012-06-21 NOTE — Progress Notes (Signed)
  Subjective: Pain improved. No nausea. No fevers or chills.  Objective: Vital signs in last 24 hours: Temp:  [97.6 F (36.4 C)-98.6 F (37 C)] 97.6 F (36.4 C) (06/01 1340) Pulse Rate:  [74-95] 74 (06/01 1340) Resp:  [17-20] 20 (06/01 1340) BP: (131-161)/(70-93) 154/93 mmHg (06/01 1340) SpO2:  [96 %-99 %] 99 % (06/01 1340) Last BM Date: 06/19/12  Intake/Output from previous day:   Intake/Output this shift:    General appearance: alert and no distress GI: Intermittent bowel sounds, soft, moderate to severe suprapubic and left lower quadrant pain. No diffuse peritoneal signs the  Lab Results:   Recent Labs  06/19/12 2322 06/21/12 0923  WBC 11.8* 10.3  HGB 12.8 11.2*  HCT 38.4 34.2*  PLT 253 241   BMET  Recent Labs  06/19/12 2322 06/21/12 0923  NA 139 136  K 3.3* 3.3*  CL 102 102  CO2 24 22  GLUCOSE 178* 80  BUN 13 11  CREATININE 0.94 0.83  CALCIUM 9.4 8.8   PT/INR No results found for this basename: LABPROT, INR,  in the last 72 hours ABG No results found for this basename: PHART, PCO2, PO2, HCO3,  in the last 72 hours  Studies/Results: Ct Abdomen Pelvis W Contrast  06/20/2012   *RADIOLOGY REPORT*  Clinical Data: Left lower quadrant pain.  Fever.  CT ABDOMEN AND PELVIS WITH CONTRAST  Technique:  Multidetector CT imaging of the abdomen and pelvis was performed following the standard protocol during bolus administration of intravenous contrast.  Contrast: 50mL OMNIPAQUE IOHEXOL 300 MG/ML  SOLN, OMNIPAQUE IOHEXOL 300 MG/ML  SOLN  Comparison: None.  Findings: A small amount of free intraperitoneal air is seen.  Mild diverticulitis is seen involving the mid to distal sigmoid colon. There is an extraluminal fluid and gas collection in the right pelvis measuring 2.9 x 4.3 cm, consistent with a diverticular abscess.  Normal appendix is visualized.  No other abscess identified.  The liver, gallbladder, pancreas, spleen, adrenal glands, and kidneys are normal in  appearance.  No evidence of hydronephrosis. No soft tissue masses or lymphadenopathy identified.  IMPRESSION:  1.  Sigmoid diverticulitis, with diverticular abscess in the right pelvis measuring 4.3 cm. 2.  Free intraperitoneal air, consistent with perforated diverticulitis.  Critical Value/emergent results were called by telephone at the time of interpretation on 06/20/2012 at 0130 hours to Dr. Dierdre Highman in the emergency department, who verbally acknowledged these results.   Original Report Authenticated By: Myles Rosenthal, M.D.    Anti-infectives: Anti-infectives   Start     Dose/Rate Route Frequency Ordered Stop   06/20/12 1000  piperacillin-tazobactam (ZOSYN) IVPB 3.375 g     3.375 g 12.5 mL/hr over 240 Minutes Intravenous Every 8 hours 06/20/12 0912     06/20/12 0145  piperacillin-tazobactam (ZOSYN) IVPB 3.375 g     3.375 g 12.5 mL/hr over 240 Minutes Intravenous  Once 06/20/12 0136 06/20/12 0224   06/20/12 0015  ciprofloxacin (CIPRO) tablet 500 mg     500 mg Oral  Once 06/20/12 0014 06/20/12 0121      Assessment/Plan: s/p * No surgery found * Acute, perforated diverticulitis of the sigmoid colon. Continue IV antibiotic coverage. As patient has demonstrated initial response to antibiotics will start some sips of some clear liquids. Surgical indications again discussed with patient. Clinically she demonstrates improvement and will continue to monitor her course closely.  LOS: 2 days    Emma Henderson C 06/21/2012

## 2012-06-22 LAB — BASIC METABOLIC PANEL
Chloride: 102 mEq/L (ref 96–112)
GFR calc non Af Amer: 62 mL/min — ABNORMAL LOW (ref >90)
Glucose, Bld: 98 mg/dL (ref 70–99)
Potassium: 3.6 mEq/L (ref 3.5–5.1)
Sodium: 139 mEq/L (ref 135–145)

## 2012-06-22 LAB — CBC
Hemoglobin: 11.1 g/dL — ABNORMAL LOW (ref 12.0–15.0)
MCHC: 32.8 g/dL (ref 30.0–36.0)
RBC: 3.9 MIL/uL (ref 3.87–5.11)
WBC: 8.6 10*3/uL (ref 4.0–10.5)

## 2012-06-22 NOTE — Progress Notes (Signed)
Utilization Review Complete  

## 2012-06-22 NOTE — Progress Notes (Signed)
  Subjective: Pain much better. Last dose of pain medication was last night. She denies any nausea. She is tolerating clear liquids. She has had a bowel movement although the loose.  Objective: Vital signs in last 24 hours: Temp:  [97.6 F (36.4 C)-98.5 F (36.9 C)] 98.5 F (36.9 C) (06/02 0430) Pulse Rate:  [74-76] 74 (06/02 0430) Resp:  [18-20] 19 (06/02 0430) BP: (140-154)/(63-93) 140/63 mmHg (06/02 0430) SpO2:  [96 %-99 %] 96 % (06/02 0430) Last BM Date: 06/20/12  Intake/Output from previous day:   Intake/Output this shift:    General appearance: alert and no distress Resp: clear to auscultation bilaterally Cardio: regular rate and rhythm GI: Intermittent bowel sounds, soft, moderate suprapubic tenderness. No peritoneal signs. Umbilical hernia is easily reducible.  Lab Results:   Recent Labs  06/21/12 0923 06/22/12 0544  WBC 10.3 8.6  HGB 11.2* 11.1*  HCT 34.2* 33.8*  PLT 241 269   BMET  Recent Labs  06/21/12 0923 06/22/12 0544  NA 136 139  K 3.3* 3.6  CL 102 102  CO2 22 28  GLUCOSE 80 98  BUN 11 10  CREATININE 0.83 0.94  CALCIUM 8.8 9.0   PT/INR No results found for this basename: LABPROT, INR,  in the last 72 hours ABG No results found for this basename: PHART, PCO2, PO2, HCO3,  in the last 72 hours  Studies/Results: No results found.  Anti-infectives: Anti-infectives   Start     Dose/Rate Route Frequency Ordered Stop   06/20/12 1000  piperacillin-tazobactam (ZOSYN) IVPB 3.375 g     3.375 g 12.5 mL/hr over 240 Minutes Intravenous Every 8 hours 06/20/12 0912     06/20/12 0145  piperacillin-tazobactam (ZOSYN) IVPB 3.375 g     3.375 g 12.5 mL/hr over 240 Minutes Intravenous  Once 06/20/12 0136 06/20/12 0224   06/20/12 0015  ciprofloxacin (CIPRO) tablet 500 mg     500 mg Oral  Once 06/20/12 0014 06/20/12 0121      Assessment/Plan: s/p * No surgery found * Perforated diverticulitis. At this time patient continues to demonstrate clinical  improvement. Continue IV antibiotics for at least the next 24 hours. Continue to slowly advance patient's diet. Increase activity. Possible discharge in the next 24-48 hours pending patient's continued progression with planned elective sigmoid colectomy in the future.  LOS: 3 days    Emma Henderson C 06/22/2012

## 2012-06-23 LAB — BASIC METABOLIC PANEL
CO2: 29 mEq/L (ref 19–32)
Calcium: 9 mg/dL (ref 8.4–10.5)
Glucose, Bld: 105 mg/dL — ABNORMAL HIGH (ref 70–99)
Potassium: 3.2 mEq/L — ABNORMAL LOW (ref 3.5–5.1)
Sodium: 139 mEq/L (ref 135–145)

## 2012-06-23 LAB — CBC
Hemoglobin: 11.7 g/dL — ABNORMAL LOW (ref 12.0–15.0)
MCH: 28.8 pg (ref 26.0–34.0)
RBC: 4.06 MIL/uL (ref 3.87–5.11)

## 2012-06-23 MED ORDER — POTASSIUM CHLORIDE CRYS ER 20 MEQ PO TBCR
40.0000 meq | EXTENDED_RELEASE_TABLET | Freq: Once | ORAL | Status: AC
  Administered 2012-06-23: 40 meq via ORAL
  Filled 2012-06-23: qty 2

## 2012-06-23 MED ORDER — AMOXICILLIN-POT CLAVULANATE 875-125 MG PO TABS: 1.0000 | ORAL_TABLET | Freq: Two times a day (BID) | ORAL | Status: DC

## 2012-06-23 MED ORDER — AMOXICILLIN-POT CLAVULANATE 875-125 MG PO TABS
1.0000 | ORAL_TABLET | Freq: Two times a day (BID) | ORAL | Status: DC
  Administered 2012-06-23: 1 via ORAL
  Filled 2012-06-23: qty 1

## 2012-06-23 NOTE — Progress Notes (Signed)
Pt discharged home today per Dr. Leticia Penna. Pt's IV site D/C'd and WNL. Pt's VS stable at this time. Pt provided with home medication list and discharge instructions. Pt also made aware that antibiotic had been called into [place]. Verbalized understanding of above. Pt left floor via WC in stable condition accompanied by NT.

## 2012-06-23 NOTE — Progress Notes (Signed)
Pt provided with education handouts regarding diverticulitis and low fiber diet.  Pt encouraged to review education and ask questions as needed. Pt verbalized understanding.

## 2012-06-23 NOTE — Care Management Note (Signed)
    Page 1 of 1   06/23/2012     3:18:46 PM   CARE MANAGEMENT NOTE 06/23/2012  Patient:  Emma Henderson, Emma Henderson   Account Number:  [id number]  Date Initiated:  06/23/2012  Documentation initiated by:  Rosemary Holms  Subjective/Objective Assessment:   Pt admitted from home and plans to go to her daughters at DC. No needs identified     Action/Plan:   Anticipated DC Date:  06/23/2012   Anticipated DC Plan:  HOME/SELF CARE      DC Planning Services  CM consult      Choice offered to / List presented to:             Status of service:  Completed, signed off Medicare Important Message given?  YES (If response is "NO", the following Medicare IM given date fields will be blank) Date Medicare IM given:  06/23/2012 Date Additional Medicare IM given:    Discharge Disposition:  HOME/SELF CARE  Per UR Regulation:  Reviewed for med. necessity/level of care/duration of stay  If discussed at Long Length of Stay Meetings, dates discussed:    Comments:  06/23/12 Rosemary Holms RN BSN CM

## 2012-06-25 ENCOUNTER — Other Ambulatory Visit: Payer: Self-pay | Admitting: General Surgery

## 2012-06-25 DIAGNOSIS — K5732 Diverticulitis of large intestine without perforation or abscess without bleeding: Secondary | ICD-10-CM

## 2012-06-29 ENCOUNTER — Encounter (HOSPITAL_COMMUNITY): Payer: Self-pay | Admitting: *Deleted

## 2012-06-29 ENCOUNTER — Emergency Department (HOSPITAL_COMMUNITY): Payer: Medicare Other

## 2012-06-29 ENCOUNTER — Inpatient Hospital Stay (HOSPITAL_COMMUNITY)
Admission: EM | Admit: 2012-06-29 | Discharge: 2012-07-07 | DRG: 392 | Disposition: A | Discharge status: Home / Self Care | Payer: Medicare Other | Attending: General Surgery | Admitting: General Surgery

## 2012-06-29 DIAGNOSIS — K5780 Diverticulitis of intestine, part unspecified, with perforation and abscess without bleeding: Secondary | ICD-10-CM

## 2012-06-29 DIAGNOSIS — Z87891 Personal history of nicotine dependence: Secondary | ICD-10-CM

## 2012-06-29 DIAGNOSIS — IMO0002 Reserved for concepts with insufficient information to code with codable children: Secondary | ICD-10-CM

## 2012-06-29 DIAGNOSIS — K5732 Diverticulitis of large intestine without perforation or abscess without bleeding: Secondary | ICD-10-CM

## 2012-06-29 DIAGNOSIS — K630 Abscess of intestine: Secondary | ICD-10-CM | POA: Diagnosis present

## 2012-06-29 DIAGNOSIS — Z79899 Other long term (current) drug therapy: Secondary | ICD-10-CM

## 2012-06-29 DIAGNOSIS — M899 Disorder of bone, unspecified: Secondary | ICD-10-CM | POA: Diagnosis present

## 2012-06-29 DIAGNOSIS — K651 Peritoneal abscess: Secondary | ICD-10-CM

## 2012-06-29 DIAGNOSIS — M8580 Other specified disorders of bone density and structure, unspecified site: Secondary | ICD-10-CM | POA: Diagnosis present

## 2012-06-29 DIAGNOSIS — R109 Unspecified abdominal pain: Secondary | ICD-10-CM

## 2012-06-29 DIAGNOSIS — K429 Umbilical hernia without obstruction or gangrene: Secondary | ICD-10-CM | POA: Diagnosis present

## 2012-06-29 HISTORY — DX: Diverticulitis of intestine, part unspecified, with perforation and abscess without bleeding: K57.80

## 2012-06-29 HISTORY — DX: Umbilical hernia without obstruction or gangrene: K42.9

## 2012-06-29 LAB — CBC
HCT: 37.6 % (ref 36.0–46.0)
RDW: 13.6 % (ref 11.5–15.5)
WBC: 11 10*3/uL — ABNORMAL HIGH (ref 4.0–10.5)

## 2012-06-29 LAB — COMPREHENSIVE METABOLIC PANEL
ALT: 18 U/L (ref 0–35)
AST: 18 U/L (ref 0–37)
Albumin: 3.2 g/dL — ABNORMAL LOW (ref 3.5–5.2)
Alkaline Phosphatase: 81 U/L (ref 39–117)
BUN: 14 mg/dL (ref 6–23)
Chloride: 105 mEq/L (ref 96–112)
Potassium: 3.6 mEq/L (ref 3.5–5.1)
Total Bilirubin: 0.3 mg/dL (ref 0.3–1.2)

## 2012-06-29 LAB — PROTIME-INR: Prothrombin Time: 14.5 seconds (ref 11.6–15.2)

## 2012-06-29 LAB — APTT: aPTT: 35 seconds (ref 24–37)

## 2012-06-29 MED ORDER — ENOXAPARIN SODIUM 40 MG/0.4ML ~~LOC~~ SOLN
40.0000 mg | SUBCUTANEOUS | Status: DC
  Filled 2012-06-29: qty 0.4

## 2012-06-29 MED ORDER — ONDANSETRON HCL 4 MG/2ML IJ SOLN
4.0000 mg | Freq: Four times a day (QID) | INTRAMUSCULAR | Status: DC | PRN
  Administered 2012-06-30 – 2012-07-02 (×6): 4 mg via INTRAVENOUS
  Filled 2012-06-29 (×6): qty 2

## 2012-06-29 MED ORDER — METRONIDAZOLE IN NACL 5-0.79 MG/ML-% IV SOLN
500.0000 mg | Freq: Three times a day (TID) | INTRAVENOUS | Status: DC
  Administered 2012-06-29 – 2012-07-03 (×11): 500 mg via INTRAVENOUS
  Filled 2012-06-29 (×14): qty 100

## 2012-06-29 MED ORDER — CIPROFLOXACIN IN D5W 400 MG/200ML IV SOLN
400.0000 mg | Freq: Two times a day (BID) | INTRAVENOUS | Status: DC
  Administered 2012-06-29 – 2012-07-03 (×8): 400 mg via INTRAVENOUS
  Filled 2012-06-29 (×9): qty 200

## 2012-06-29 MED ORDER — MORPHINE SULFATE 2 MG/ML IJ SOLN
1.0000 mg | INTRAMUSCULAR | Status: DC | PRN
  Administered 2012-06-30: 1 mg via INTRAVENOUS
  Administered 2012-06-30 (×3): 2 mg via INTRAVENOUS
  Filled 2012-06-29 (×4): qty 1

## 2012-06-29 MED ORDER — ACETAMINOPHEN 325 MG PO TABS
650.0000 mg | ORAL_TABLET | Freq: Four times a day (QID) | ORAL | Status: DC | PRN
  Administered 2012-06-30 – 2012-07-05 (×9): 650 mg via ORAL
  Filled 2012-06-29 (×9): qty 2

## 2012-06-29 MED ORDER — ACETAMINOPHEN 650 MG RE SUPP: 650.0000 mg | Freq: Four times a day (QID) | RECTAL | Status: DC | PRN

## 2012-06-29 MED ORDER — IOHEXOL 300 MG/ML  SOLN
25.0000 mL | INTRAMUSCULAR | Status: AC
  Administered 2012-06-29 (×2): 25 mL via ORAL

## 2012-06-29 MED ORDER — HYDROCODONE-ACETAMINOPHEN 5-325 MG PO TABS
1.0000 | ORAL_TABLET | ORAL | Status: DC | PRN
  Administered 2012-07-01: 1 via ORAL
  Filled 2012-06-29: qty 1

## 2012-06-29 MED ORDER — HEPARIN SODIUM (PORCINE) 5000 UNIT/ML IJ SOLN
5000.0000 [IU] | Freq: Three times a day (TID) | INTRAMUSCULAR | Status: DC
  Administered 2012-07-01 – 2012-07-07 (×14): 5000 [IU] via SUBCUTANEOUS
  Filled 2012-06-29 (×29): qty 1

## 2012-06-29 MED ORDER — KCL IN DEXTROSE-NACL 20-5-0.45 MEQ/L-%-% IV SOLN
INTRAVENOUS | Status: DC
  Administered 2012-06-29 – 2012-07-01 (×5): via INTRAVENOUS
  Administered 2012-07-02: 50 mL/h via INTRAVENOUS
  Administered 2012-07-02: 03:00:00 via INTRAVENOUS
  Filled 2012-06-29 (×14): qty 1000

## 2012-06-29 MED ORDER — IOHEXOL 300 MG/ML  SOLN
100.0000 mL | Freq: Once | INTRAMUSCULAR | Status: AC | PRN
  Administered 2012-06-29: 100 mL via INTRAVENOUS

## 2012-06-29 NOTE — H&P (Signed)
Emma Henderson is an 67 y.o. female.   PCP: Emma Henderson,  [place], [place]. 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  CT scan revealed:  Sigmoid diverticulitis, with diverticular abscess in the right  pelvis measuring 4.3 cm.  Free intraperitoneal air, consistent with perforated  Diverticulitis. She was admitted by Dr Leticia Penna and place on Zosyn 5/30-6/2.  She was converted to Oral Augmentin, and discharged home on 06/23/12.  She has done well on a low fiber diet till last PM when she developed acute pain about 8 PM.  She went to bed and eventually to sleep.  She woke up with acute pain and was taker to the ER at [facility].   Current CT shows:  There is been enlargement of the abscess in the right pelvis which is largely gas containing. This measures 4.8 x 5.9 cm transversely  as opposed to 2.9 x 4.3 cm on the previous study. Regional  inflammatory change adjacent to the abscess appears quite similar. WBC is back up to 11K which is where it was prior to admit on 5/30. Other labs are stable. Her pain now is currently improving even though she has had no help or treatment.    Past Medical History  Diagnosis Date  . Diverticulitis 2006  . Osteopenia 01/2011    t-score -1.3, FRAX 8.6 % & 0.8%    Past Surgical History  Procedure Laterality Date  . Cervical spine surgery     Colonoscopy 08-31-12 with hx of diverticulosis on study    . Lumbar spine surgery      Family History  Problem Relation Age of Onset  . Heart disease Mother     heart attacks died with second one  . Stroke Father     died of a stroke  . Heart disease Father     had two MI's   Social History:  reports that she has quit smoking. She has never used smokeless tobacco. She reports that she drinks about 3.5 ounces of alcohol per week. She reports that she does not use illicit drugs.  Allergies: No Known Allergies   Prior to  Admission medications   Medication Sig Start Date End Date Taking? Authorizing Provider  acetaminophen (TYLENOL) 500 MG tablet Take 500 mg by mouth every 6 (six) hours as needed for pain.   Yes Historical Provider, MD  amoxicillin-clavulanate (AUGMENTIN) 875-125 MG per tablet Take 1 tablet by mouth every 12 (twelve) hours. 06/23/12  Yes Fabio Bering, MD  HYDROcodone-acetaminophen (NORCO/VICODIN) 5-325 MG per tablet Take 1 tablet by mouth every 4 (four) hours as needed for pain.   Yes Historical Provider, MD     Results for orders placed during the hospital encounter of 06/29/12 (from the past 48 hour(s))  CBC     Status: Abnormal   Collection Time    06/29/12  9:26 AM      Result Value Range   WBC 11.0 (*) 4.0 - 10.5 K/uL   RBC 4.40  3.87 - 5.11 MIL/uL   Hemoglobin 12.2  12.0 - 15.0 g/dL   HCT 16.1  09.6 - 04.5 %   MCV 85.5  78.0 - 100.0 fL   MCH 27.7  26.0 - 34.0 pg   MCHC 32.4  30.0 - 36.0 g/dL   RDW 40.9  81.1 - 91.4 %   Platelets 331  150 - 400 K/uL  COMPREHENSIVE METABOLIC  PANEL     Status: Abnormal   Collection Time    06/29/12  9:26 AM      Result Value Range   Sodium 139  135 - 145 mEq/L   Potassium 3.6  3.5 - 5.1 mEq/L   Chloride 105  96 - 112 mEq/L   CO2 20  19 - 32 mEq/L   Glucose, Bld 123 (*) 70 - 99 mg/dL   BUN 14  6 - 23 mg/dL   Creatinine, Ser 2.13  0.50 - 1.10 mg/dL   Calcium 9.3  8.4 - 08.6 mg/dL   Total Protein 7.2  6.0 - 8.3 g/dL   Albumin 3.2 (*) 3.5 - 5.2 g/dL   AST 18  0 - 37 U/L   ALT 18  0 - 35 U/L   Alkaline Phosphatase 81  39 - 117 U/L   Total Bilirubin 0.3  0.3 - 1.2 mg/dL   GFR calc non Af Amer 75 (*) >90 mL/min   GFR calc Af Amer 87 (*) >90 mL/min   Comment:            The eGFR has been calculated     using the CKD EPI equation.     This calculation has not been     validated in all clinical     situations.     eGFR's persistently     <90 mL/min signify     possible Chronic Kidney Disease.   Ct Abdomen Pelvis W Contrast  06/29/2012    *RADIOLOGY REPORT*  Clinical Data: Right-sided pain.  Follow-up diverticulitis with abscess.  CT ABDOMEN AND PELVIS WITH CONTRAST  Technique:  Multidetector CT imaging of the abdomen and pelvis was performed following the standard protocol during bolus administration of intravenous contrast.  Contrast: OMNIPAQUE IOHEXOL 300 MG/ML  SOLN  Comparison: 06/20/2012  Findings: Lung bases are clear.  No pleural or pericardial fluid.  The liver has a normal appearance without focal lesions or biliary ductal dilatation.  No calcified gallstones.  The spleen is normal. The pancreas is normal.  The adrenal glands are normal.  The kidneys are normal.  The aorta and IVC are normal.  There is been enlargement of the abscess in the right pelvis which is largely gas containing.  This measures 4.8 x 5.9 cm transversely as opposed to 2.9 x 4.3 cm on the previous study. Regional inflammatory change adjacent to the abscess appears quite similar. No qualitatively new finding.  IMPRESSION: Enlargement of the diverticular abscess in the right pelvis now measuring 4.8 x 5.9 cm and largely filled with gas.   Original Report Authenticated By: Paulina Fusi, M.D.    Review of Systems  Constitutional: Positive for weight loss (she has lost about 8 pounds since her original admission.).  HENT: Negative.   Eyes: Negative.   Respiratory: Negative.   Cardiovascular: Negative.   Gastrointestinal: Positive for heartburn (rarely), nausea, abdominal pain (acute onset last PM ) and diarrhea (still having loose stools, she attributed it to liquids ).  Genitourinary: Negative.   Musculoskeletal: Negative.   Skin: Negative.   Endo/Heme/Allergies: Negative.   Psychiatric/Behavioral: Negative.     Blood pressure 142/71, pulse 71, temperature 97.3 F (36.3 C), temperature source Oral, resp. rate 16, last menstrual period 01/21/1998, SpO2 100.00%. Physical Exam  Constitutional: She appears well-developed and well-nourished.  HENT:   Head: Normocephalic and atraumatic.  Nose: Nose normal.  Eyes: Conjunctivae and EOM are normal. Pupils are equal, round, and reactive to light. Right eye  exhibits no discharge. Left eye exhibits no discharge. No scleral icterus.  Neck: Normal range of motion. Neck supple. No JVD present. No tracheal deviation present. No thyromegaly present.  Cardiovascular: Normal rate, regular rhythm, normal heart sounds and intact distal pulses.  Exam reveals no gallop.   No murmur heard. Split S2  Respiratory: Effort normal and breath sounds normal. No respiratory distress. She has no wheezes. She has no rales. She exhibits no tenderness.  GI: Soft. Bowel sounds are normal. She exhibits distension. She exhibits no mass. There is tenderness (Minimnal tenderness in the RLQ). There is no rebound and no guarding.  Lymphadenopathy:    She has no cervical adenopathy.     Assessment/Plan 1. Perforated diverticulitis with increasing abscess formation and new pain.  Prior Rx 5/30- thru present date. 2.Osteopenia 3. Umbilical Hernia   Plan:  Admit, place on IV antibiotics, clear liquids, ask IR to look at and see if they can place a drain.  She does not feel tender enough to require OR right now.  Ir is going to try and drain air tomorrow.  Will follow and see where this goes.  Will Marlyne Beards PA-C for Dr. Abigail Miyamoto  Braidon Chermak 06/29/2012, 2:23 PM

## 2012-06-29 NOTE — ED Provider Notes (Signed)
History     CSN: [id number]  Arrival date & time 06/29/12  7829   First MD Initiated Contact with Patient 06/29/12 [phone]      Chief Complaint  Patient presents with  . Abdominal Pain    (Consider location/radiation/quality/duration/timing/severity/associated sxs/prior treatment) HPI Comments: Recent admission to [facility] for perforated diverticulitis with associated abscess (4 cm x 3 cm). Did well with IVF and IV antibiotics, no surgery required at that time. Now new RLQ pain.  Patient is a 67 y.o. female presenting with abdominal pain. The history is provided by the patient.  Abdominal Pain This is a recurrent problem. The current episode started yesterday. The problem occurs constantly. The problem has been unchanged. Associated symptoms include abdominal pain. Pertinent negatives include no chills, coughing or fever. Nothing aggravates the symptoms. She has tried nothing for the symptoms.    Past Medical History  Diagnosis Date  . Diverticulitis 2006  . Osteopenia 01/2011    t-score -1.3, FRAX 8.6 % & 0.8%    Past Surgical History  Procedure Laterality Date  . Cervical spine surgery    . Lumbar spine surgery      Family History  Problem Relation Age of Onset  . Heart disease Mother     heart attacks died with second one  . Stroke Father     died of a stroke  . Heart disease Father     had two MI's    History  Substance Use Topics  . Smoking status: Former Games developer  . Smokeless tobacco: Never Used     Comment: stopped at age 48  . Alcohol Use: 3.5 oz/week    7 drink(s) per week    OB History   Grav Para Term Preterm Abortions TAB SAB Ect Mult Living   2 2 2       2       Review of Systems  Constitutional: Negative for fever and chills.  Respiratory: Negative for cough and shortness of breath.   Gastrointestinal: Positive for abdominal pain.  All other systems reviewed and are negative.    Allergies  Review of patient's allergies indicates no known  allergies.  Home Medications   Current Outpatient Rx  Name  Route  Sig  Dispense  Refill  . acetaminophen (TYLENOL) 500 MG tablet   Oral   Take 500 mg by mouth every 6 (six) hours as needed for pain.         Marland Kitchen amoxicillin-clavulanate (AUGMENTIN) 875-125 MG per tablet   Oral   Take 1 tablet by mouth every 12 (twelve) hours.   14 tablet   0   . PRESCRIPTION MEDICATION   Oral   Take 0.5 tablets by mouth every 8 (eight) hours as needed (pain). Pain medication           BP 155/67  Pulse 68  Temp(Src) 97.3 F (36.3 C) (Oral)  Resp 16  SpO2 100%  LMP 01/21/1998  Physical Exam  Nursing note and vitals reviewed. Constitutional: She is oriented to person, place, and time. She appears well-developed and well-nourished. No distress.  HENT:  Head: Normocephalic and atraumatic.  Eyes: EOM are normal. Pupils are equal, round, and reactive to light.  Neck: Normal range of motion. Neck supple.  Cardiovascular: Normal rate and regular rhythm.  Exam reveals no friction rub.   No murmur heard. Pulmonary/Chest: Effort normal and breath sounds normal. No respiratory distress. She has no wheezes. She has no rales.  Abdominal: Soft. She exhibits no  distension. There is tenderness (RLQ, mild LLQ). There is no rebound.  Musculoskeletal: Normal range of motion. She exhibits no edema.  Neurological: She is alert and oriented to person, place, and time.  Skin: She is not diaphoretic.    ED Course  Procedures (including critical care time)  Labs Reviewed  CBC - Abnormal; Notable for the following:    WBC 11.0 (*)    All other components within normal limits  COMPREHENSIVE METABOLIC PANEL - Abnormal; Notable for the following:    Glucose, Bld 123 (*)    Albumin 3.2 (*)    GFR calc non Af Amer 75 (*)    GFR calc Af Amer 87 (*)    All other components within normal limits   Ct Abdomen Pelvis W Contrast  06/29/2012   *RADIOLOGY REPORT*  Clinical Data: Right-sided pain.  Follow-up  diverticulitis with abscess.  CT ABDOMEN AND PELVIS WITH CONTRAST  Technique:  Multidetector CT imaging of the abdomen and pelvis was performed following the standard protocol during bolus administration of intravenous contrast.  Contrast: OMNIPAQUE IOHEXOL 300 MG/ML  SOLN  Comparison: 06/20/2012  Findings: Lung bases are clear.  No pleural or pericardial fluid.  The liver has a normal appearance without focal lesions or biliary ductal dilatation.  No calcified gallstones.  The spleen is normal. The pancreas is normal.  The adrenal glands are normal.  The kidneys are normal.  The aorta and IVC are normal.  There is been enlargement of the abscess in the right pelvis which is largely gas containing.  This measures 4.8 x 5.9 cm transversely as opposed to 2.9 x 4.3 cm on the previous study. Regional inflammatory change adjacent to the abscess appears quite similar. No qualitatively new finding.  IMPRESSION: Enlargement of the diverticular abscess in the right pelvis now measuring 4.8 x 5.9 cm and largely filled with gas.   Original Report Authenticated By: Paulina Fusi, M.D.     1. Abdominal abscess   2. Abdominal pain   3. Perforated diverticulitis       MDM   67 year old female here with abdominal pain. Recent admission to general surgery at any plan for perforated diverticulitis. She did well in the hospital went home with by mouth Augmentin, which she is still taking. She started to experience new right-sided abdominal pain last night. Has continued that night into this morning. She denies any nausea, vomiting, diarrhea, fevers. Here her vitals are stable. Mild right lower quadrant pain. Will re-scan at this time.  CT scan with enlarging abscess - 5 cm x 6 cm. Surgery consulted and will admit.      Dagmar Hait, MD 06/29/12 [phone]

## 2012-06-29 NOTE — ED Notes (Signed)
General Surgery, PA at bedside

## 2012-06-29 NOTE — ED Notes (Signed)
Per Winifred Olive PA, admit patient to [facility], not [facility]

## 2012-06-29 NOTE — H&P (Signed)
I have seen and examined the patient and agree with the assessment and plans. Discussed with IR.  They will attempt drain placement tomorrow in collection. Will eventually need a partial colectomy.  Are trying to avoid a colostomy.  Tymeer Vaquera A. Magnus Ivan  MD, FACS

## 2012-06-29 NOTE — H&P (Signed)
Emma Henderson is an 67 y.o. female.   Chief Complaint: Rt lower abdominal pain Hx diverticulitis CT reveals diverticular rupture and Rt low abd collection Scheduled for drain placement 6/10 HPI: Diverticulitis  Past Medical History  Diagnosis Date  . Diverticulitis 2006  . Osteopenia 01/2011    t-score -1.3, FRAX 8.6 % & 0.8%    Past Surgical History  Procedure Laterality Date  . Cervical spine surgery    . Lumbar spine surgery      Family History  Problem Relation Age of Onset  . Heart disease Mother     heart attacks died with second one  . Stroke Father     died of a stroke  . Heart disease Father     had two MI's   Social History:  reports that she has quit smoking. She has never used smokeless tobacco. She reports that she drinks about 3.5 ounces of alcohol per week. She reports that she does not use illicit drugs.  Allergies: No Known Allergies  Medications Prior to Admission  Medication Sig Dispense Refill  . acetaminophen (TYLENOL) 500 MG tablet Take 500 mg by mouth every 6 (six) hours as needed for pain.      Marland Kitchen amoxicillin-clavulanate (AUGMENTIN) 875-125 MG per tablet Take 1 tablet by mouth every 12 (twelve) hours.  14 tablet  0  . HYDROcodone-acetaminophen (NORCO/VICODIN) 5-325 MG per tablet Take 1 tablet by mouth every 4 (four) hours as needed for pain.        Results for orders placed during the hospital encounter of 06/29/12 (from the past 48 hour(s))  CBC     Status: Abnormal   Collection Time    06/29/12  9:26 AM      Result Value Range   WBC 11.0 (*) 4.0 - 10.5 K/uL   RBC 4.40  3.87 - 5.11 MIL/uL   Hemoglobin 12.2  12.0 - 15.0 g/dL   HCT 16.1  09.6 - 04.5 %   MCV 85.5  78.0 - 100.0 fL   MCH 27.7  26.0 - 34.0 pg   MCHC 32.4  30.0 - 36.0 g/dL   RDW 40.9  81.1 - 91.4 %   Platelets 331  150 - 400 K/uL  COMPREHENSIVE METABOLIC PANEL     Status: Abnormal   Collection Time    06/29/12  9:26 AM      Result Value Range   Sodium 139  135 - 145 mEq/L    Potassium 3.6  3.5 - 5.1 mEq/L   Chloride 105  96 - 112 mEq/L   CO2 20  19 - 32 mEq/L   Glucose, Bld 123 (*) 70 - 99 mg/dL   BUN 14  6 - 23 mg/dL   Creatinine, Ser 7.82  0.50 - 1.10 mg/dL   Calcium 9.3  8.4 - 95.6 mg/dL   Total Protein 7.2  6.0 - 8.3 g/dL   Albumin 3.2 (*) 3.5 - 5.2 g/dL   AST 18  0 - 37 U/L   ALT 18  0 - 35 U/L   Alkaline Phosphatase 81  39 - 117 U/L   Total Bilirubin 0.3  0.3 - 1.2 mg/dL   GFR calc non Af Amer 75 (*) >90 mL/min   GFR calc Af Amer 87 (*) >90 mL/min   Comment:            The eGFR has been calculated     using the CKD EPI equation.     This calculation has  not been     validated in all clinical     situations.     eGFR's persistently     <90 mL/min signify     possible Chronic Kidney Disease.   Ct Abdomen Pelvis W Contrast  06/29/2012   *RADIOLOGY REPORT*  Clinical Data: Right-sided pain.  Follow-up diverticulitis with abscess.  CT ABDOMEN AND PELVIS WITH CONTRAST  Technique:  Multidetector CT imaging of the abdomen and pelvis was performed following the standard protocol during bolus administration of intravenous contrast.  Contrast: OMNIPAQUE IOHEXOL 300 MG/ML  SOLN  Comparison: 06/20/2012  Findings: Lung bases are clear.  No pleural or pericardial fluid.  The liver has a normal appearance without focal lesions or biliary ductal dilatation.  No calcified gallstones.  The spleen is normal. The pancreas is normal.  The adrenal glands are normal.  The kidneys are normal.  The aorta and IVC are normal.  There is been enlargement of the abscess in the right pelvis which is largely gas containing.  This measures 4.8 x 5.9 cm transversely as opposed to 2.9 x 4.3 cm on the previous study. Regional inflammatory change adjacent to the abscess appears quite similar. No qualitatively new finding.  IMPRESSION: Enlargement of the diverticular abscess in the right pelvis now measuring 4.8 x 5.9 cm and largely filled with gas.   Original Report Authenticated By: Paulina Fusi, M.D.    Review of Systems  Constitutional: Negative for fever.  Respiratory: Negative for cough.   Cardiovascular: Negative for chest pain.  Gastrointestinal: Positive for nausea and abdominal pain. Negative for vomiting.  Neurological: Negative for headaches.    Blood pressure 142/71, pulse 71, temperature 97.3 F (36.3 C), temperature source Oral, resp. rate 16, last menstrual period 01/21/1998, SpO2 100.00%. Physical Exam  Constitutional: She is oriented to person, place, and time. She appears well-developed and well-nourished.  Cardiovascular: Normal rate, regular rhythm and normal heart sounds.   No murmur heard. Respiratory: Effort normal and breath sounds normal. She has no wheezes.  GI: Soft. Bowel sounds are normal. There is tenderness. There is guarding.  Musculoskeletal: Normal range of motion.  Neurological: She is alert and oriented to person, place, and time.  Psychiatric: She has a normal mood and affect. Her behavior is normal. Judgment and thought content normal.     Assessment/Plan RLQ divertic abscess Scheduled for drain placement in am Pt aware of procedure benefits and risks and agreeable to proceed consent signed and in chart  Tziporah Knoke A 06/29/2012, 4:35 PM

## 2012-06-29 NOTE — ED Notes (Signed)
Family at bedside. 

## 2012-06-30 ENCOUNTER — Encounter (HOSPITAL_COMMUNITY): Payer: Self-pay

## 2012-06-30 ENCOUNTER — Inpatient Hospital Stay (HOSPITAL_COMMUNITY): Payer: Medicare Other

## 2012-06-30 LAB — BASIC METABOLIC PANEL
Calcium: 8.8 mg/dL (ref 8.4–10.5)
Creatinine, Ser: 0.77 mg/dL (ref 0.50–1.10)
GFR calc non Af Amer: 86 mL/min — ABNORMAL LOW (ref >90)
Glucose, Bld: 123 mg/dL — ABNORMAL HIGH (ref 70–99)
Sodium: 137 mEq/L (ref 135–145)

## 2012-06-30 LAB — CBC
MCH: 27.4 pg (ref 26.0–34.0)
Platelets: 306 10*3/uL (ref 150–400)
RBC: 4.19 MIL/uL (ref 3.87–5.11)
RDW: 13.9 % (ref 11.5–15.5)
WBC: 8.4 10*3/uL (ref 4.0–10.5)

## 2012-06-30 MED ORDER — MIDAZOLAM HCL 2 MG/2ML IJ SOLN
INTRAMUSCULAR | Status: AC | PRN
  Administered 2012-06-30: 1 mg via INTRAVENOUS
  Administered 2012-06-30: 2 mg via INTRAVENOUS
  Administered 2012-06-30: 0.5 mg via INTRAVENOUS

## 2012-06-30 MED ORDER — HYDROCODONE-ACETAMINOPHEN 5-325 MG PO TABS: 1.0000 | ORAL_TABLET | ORAL | Status: DC | PRN

## 2012-06-30 MED ORDER — FENTANYL CITRATE 0.05 MG/ML IJ SOLN
INTRAMUSCULAR | Status: AC | PRN
  Administered 2012-06-30: 25 ug via INTRAVENOUS
  Administered 2012-06-30 (×2): 50 ug via INTRAVENOUS

## 2012-06-30 MED ORDER — MIDAZOLAM HCL 2 MG/2ML IJ SOLN
INTRAMUSCULAR | Status: AC
  Filled 2012-06-30: qty 4

## 2012-06-30 MED ORDER — FENTANYL CITRATE 0.05 MG/ML IJ SOLN
INTRAMUSCULAR | Status: AC
  Filled 2012-06-30: qty 4

## 2012-06-30 NOTE — Progress Notes (Signed)
Patient ID: Emma Henderson, female   DOB: 04/28/1945, 67 y.o.   MRN: [id number] For drain placement today.  Further plans pending placement Abdomen soft, NT

## 2012-06-30 NOTE — Procedures (Signed)
CT pelvic abscess drain placement Min bloody purulent output, sent for GS, C&S No complication No blood loss. See complete dictation in [facility].

## 2012-06-30 NOTE — Progress Notes (Signed)
Subjective: Pt laying bed, lights off, cold wash cloth on top of eyes and forehead. C/o severe headache and hungry since last night. Has been on clear liquid diet since Sunday, NPO since midnight. Denies any abdominal pain today. Able to ambulate yesterday, none today due to headache. Has flatus and last BM 4 AM this morning. Pt is aware that IR will be coming up to get her today.  Objective: Vital signs in last 24 hours: Temp:  [97.7 F (36.5 C)-98.7 F (37.1 C)] 97.7 F (36.5 C) (06/10 0523) Pulse Rate:  [64-71] 64 (06/10 0523) Resp:  [18] 18 (06/10 0523) BP: (125-148)/(65-113) 140/113 mmHg (06/10 0523) SpO2:  [95 %-100 %] 96 % (06/10 0523) Weight:  [150 lb (68.04 kg)-150 lb 9.2 oz (68.3 kg)] 150 lb 9.2 oz (68.3 kg) (06/09 1645) Last BM Date: 06/28/12  Intake/Output from previous day: 06/09 0701 - 06/10 0700 In: 400 [P.O.:400] Out: -  Intake/Output this shift:    PE: Gen:  Alert, moderate distress over headache, pleasant Abd: Soft, NT/ND, +BS, no HSM, non tender umbilical hernia easily reduced  Lab Results:   Recent Labs  06/29/12 0926 06/30/12 0702  WBC 11.0* 8.4  HGB 12.2 11.5*  HCT 37.6 35.4*  PLT 331 306   BMET  Recent Labs  06/29/12 0926 06/30/12 0702  NA 139 137  K 3.6 3.5  CL 105 105  CO2 20 22  GLUCOSE 123* 123*  BUN 14 6  CREATININE 0.80 0.77  CALCIUM 9.3 8.8   PT/INR  Recent Labs  06/29/12 1631  LABPROT 14.5  INR 1.15   CMP     Component Value Date/Time   NA 137 06/30/2012 0702   K 3.5 06/30/2012 0702   CL 105 06/30/2012 0702   CO2 22 06/30/2012 0702   GLUCOSE 123* 06/30/2012 0702   BUN 6 06/30/2012 0702   CREATININE 0.77 06/30/2012 0702   CALCIUM 8.8 06/30/2012 0702   PROT 7.2 06/29/2012 0926   ALBUMIN 3.2* 06/29/2012 0926   AST 18 06/29/2012 0926   ALT 18 06/29/2012 0926   ALKPHOS 81 06/29/2012 0926   BILITOT 0.3 06/29/2012 0926   GFRNONAA 86* 06/30/2012 0702   GFRAA >90 06/30/2012 0702   Lipase     Component Value Date/Time   LIPASE 20  06/19/2012 2322       Studies/Results: Ct Abdomen Pelvis W Contrast  06/29/2012   *RADIOLOGY REPORT*  Clinical Data: Right-sided pain.  Follow-up diverticulitis with abscess.  CT ABDOMEN AND PELVIS WITH CONTRAST  Technique:  Multidetector CT imaging of the abdomen and pelvis was performed following the standard protocol during bolus administration of intravenous contrast.  Contrast: OMNIPAQUE IOHEXOL 300 MG/ML  SOLN  Comparison: 06/20/2012  Findings: Lung bases are clear.  No pleural or pericardial fluid.  The liver has a normal appearance without focal lesions or biliary ductal dilatation.  No calcified gallstones.  The spleen is normal. The pancreas is normal.  The adrenal glands are normal.  The kidneys are normal.  The aorta and IVC are normal.  There is been enlargement of the abscess in the right pelvis which is largely gas containing.  This measures 4.8 x 5.9 cm transversely as opposed to 2.9 x 4.3 cm on the previous study. Regional inflammatory change adjacent to the abscess appears quite similar. No qualitatively new finding.  IMPRESSION: Enlargement of the diverticular abscess in the right pelvis now measuring 4.8 x 5.9 cm and largely filled with gas.   Original Report Authenticated  By: Paulina Fusi, M.D.    Anti-infectives: Anti-infectives   Start     Dose/Rate Route Frequency Ordered Stop   06/29/12 1430  metroNIDAZOLE (FLAGYL) IVPB 500 mg     500 mg 100 mL/hr over 60 Minutes Intravenous Every 8 hours 06/29/12 1423     06/29/12 1430  ciprofloxacin (CIPRO) IVPB 400 mg     400 mg 200 mL/hr over 60 Minutes Intravenous Every 12 hours 06/29/12 1423         Assessment/Plan Abdominal abscess, abdominal pain, Perforated Diverticulitis 1) IR for drain placement today 2) SCD and Heparin 3) IVF, pain control, antiemetics 4) Continue Antibiotics  Leukocytosis Normalized 8.4     LOS: 1 day    Cephus Shelling, PA-S  06/30/2012, 9:32 AM Pager: [phone]

## 2012-06-30 NOTE — Progress Notes (Signed)
Nutrition Brief Note  Patient identified on the Malnutrition Screening Tool (MST) Report  Body mass index is 26.68 kg/(m^2). Patient meets criteria for overweight based on current BMI.   Current diet order is NPO. Labs and medications reviewed.   Pt admitted with diverticular abscess, recently admitted with diverticulitis.  Pt reports she was eating well since discharge.  Wt appears stable.  No nutrition interventions warranted at this time. If nutrition issues arise, please consult RD.   Will monitor for diet advancement.  Loyce Dys, MS RD LDN Clinical Inpatient Dietitian Pager: [phone] Weekend/After hours pager: [phone]

## 2012-07-01 MED ORDER — DIPHENHYDRAMINE HCL 25 MG PO CAPS: 25.0000 mg | ORAL_CAPSULE | Freq: Four times a day (QID) | ORAL | Status: DC | PRN

## 2012-07-01 NOTE — Progress Notes (Signed)
I have seen and examined the patient and agree with the assessment and plans. Will allow po. If nausea persists, will change up meds  Hopelynn Gartland A. Magnus Ivan  MD, FACS

## 2012-07-01 NOTE — Progress Notes (Signed)
Pt's daughter was beside when I arrived. Pt was delightful and only complained of pain. Had prayer w/pt and daughter. Both were grateful for visit. Marjory Lies Chaplain  07/01/12 1100  Clinical Encounter Type  Visited With Patient and family together

## 2012-07-01 NOTE — Progress Notes (Signed)
Subjective: Pt feels fine today, headache gone.  Had N/V episode after eating jello.  Doesn't like anything on the liquid tray.  Wants oatmeal.  Daughter at bedside.  Not ambulated much.  Urinating well.  Had a BM yesterday, passing flatus.    Objective: Vital signs in last 24 hours: Temp:  [97.7 F (36.5 C)-98.5 F (36.9 C)] 98.5 F (36.9 C) (06/11 0526) Pulse Rate:  [68-82] 68 (06/11 0526) Resp:  [10-20] 18 (06/11 0526) BP: (109-155)/(49-78) 109/55 mmHg (06/11 0526) SpO2:  [93 %-100 %] 100 % (06/11 0526) Last BM Date: 06/28/12  Intake/Output from previous day: 06/10 0701 - 06/11 0700 In: 0  Out: 1250 [Urine:1250] Intake/Output this shift:    PE: Gen:  Alert, NAD, pleasant Abd: Soft, NT/ND, +BS, no HSM, drain in RLQ, no erythema/edema   Lab Results:   Recent Labs  06/29/12 0926 06/30/12 0702  WBC 11.0* 8.4  HGB 12.2 11.5*  HCT 37.6 35.4*  PLT 331 306   BMET  Recent Labs  06/29/12 0926 06/30/12 0702  NA 139 137  K 3.6 3.5  CL 105 105  CO2 20 22  GLUCOSE 123* 123*  BUN 14 6  CREATININE 0.80 0.77  CALCIUM 9.3 8.8   PT/INR  Recent Labs  06/29/12 1631  LABPROT 14.5  INR 1.15   CMP     Component Value Date/Time   NA 137 06/30/2012 0702   K 3.5 06/30/2012 0702   CL 105 06/30/2012 0702   CO2 22 06/30/2012 0702   GLUCOSE 123* 06/30/2012 0702   BUN 6 06/30/2012 0702   CREATININE 0.77 06/30/2012 0702   CALCIUM 8.8 06/30/2012 0702   PROT 7.2 06/29/2012 0926   ALBUMIN 3.2* 06/29/2012 0926   AST 18 06/29/2012 0926   ALT 18 06/29/2012 0926   ALKPHOS 81 06/29/2012 0926   BILITOT 0.3 06/29/2012 0926   GFRNONAA 86* 06/30/2012 0702   GFRAA >90 06/30/2012 0702   Lipase     Component Value Date/Time   LIPASE 20 06/19/2012 2322       Studies/Results: Ct Abdomen Pelvis W Contrast  06/29/2012   *RADIOLOGY REPORT*  Clinical Data: Right-sided pain.  Follow-up diverticulitis with abscess.  CT ABDOMEN AND PELVIS WITH CONTRAST  Technique:  Multidetector CT imaging of the  abdomen and pelvis was performed following the standard protocol during bolus administration of intravenous contrast.  Contrast: OMNIPAQUE IOHEXOL 300 MG/ML  SOLN  Comparison: 06/20/2012  Findings: Lung bases are clear.  No pleural or pericardial fluid.  The liver has a normal appearance without focal lesions or biliary ductal dilatation.  No calcified gallstones.  The spleen is normal. The pancreas is normal.  The adrenal glands are normal.  The kidneys are normal.  The aorta and IVC are normal.  There is been enlargement of the abscess in the right pelvis which is largely gas containing.  This measures 4.8 x 5.9 cm transversely as opposed to 2.9 x 4.3 cm on the previous study. Regional inflammatory change adjacent to the abscess appears quite similar. No qualitatively new finding.  IMPRESSION: Enlargement of the diverticular abscess in the right pelvis now measuring 4.8 x 5.9 cm and largely filled with gas.   Original Report Authenticated By: Paulina Fusi, M.D.   Ct Image Guided Drainage Percut Cath  Peritoneal Retroperit  06/30/2012   *RADIOLOGY REPORT*  Clinical data:  Right pelvic diverticular abscess  CT GUIDED PELVIC ABSCESS DRAINAGE CATHETER PLACEMENT  Technique and findings: The procedure, risks (including but not  limited to bleeding, infection, organ damage), benefits, and alternatives were explained to the patient.  Questions regarding the procedure were encouraged and answered.  The patient understands and consents to the procedure.Survey scans through the lower pelvis were obtained and an appropriate skin entry site was identified. Operator donned sterile gloves and mask.   Site was marked, prepped with Betadine, draped in usual sterile fashion, infiltrated locally with 1% lidocaine.  Intravenous Fentanyl and Versed were administered as conscious sedation during continuous cardiorespiratory monitoring by the radiology RN, with a total moderate sedation time of 22 minutes.  Under CT fluoroscopic  guidance, a curved 21 gauge trocar needle was advanced into the collection.  Gas spontaneously returned through the needle hub.  A 018 guide wire advanced easily within the collection, its position confirmed on CT fluoroscopy.  The 6-French transitional dilator was advanced into the collection, position confirmed on CT.  This was exchanged over an Amplatz wire for serial vascular dilators, facilitating placement of a 12-French pigtail catheter, formed centrally within the collection.  A scant amount of bloody purulent material was aspirated, sent for routine Gram stain, culture and sensitivity.  The catheter was secured externally with O-Prolene suture and Statlock, and placed to gravity drain bag. The patient tolerated the procedure well.  No immediate complication.  IMPRESSION: Technically successful pelvic abscess drainage catheter placement under CT.   Original Report Authenticated By: D. Andria Rhein, MD    Anti-infectives: Anti-infectives   Start     Dose/Rate Route Frequency Ordered Stop   06/29/12 1430  metroNIDAZOLE (FLAGYL) IVPB 500 mg     500 mg 100 mL/hr over 60 Minutes Intravenous Every 8 hours 06/29/12 1423     06/29/12 1430  ciprofloxacin (CIPRO) IVPB 400 mg     400 mg 200 mL/hr over 60 Minutes Intravenous Every 12 hours 06/29/12 1423         Assessment/Plan Abdominal abscess, abdominal pain, Perforated Diverticulitis  1) IR drained yesterday purulent bloody output, culture negative day 1, pending final results 2) SCD and Heparin  3) IVF, pain control, antiemetics  4) Continue Antibiotics  5) Await culture results  Leukocytosis Normalized 8.4 Nausea from jello - will try some outside food and advance to fulls to see if she can tolerate those foods better (she doesn't like anything on the clear tray) Skin rash - monitor for now, could be medication sensitivity, benadryl prn    LOS: 2 days    DORT, Kirt Chew 07/01/2012, 9:10 AM Pager: [phone]

## 2012-07-01 NOTE — Progress Notes (Signed)
Subjective: RLQ divertic abscess drain placed 6/10 Pt some better today  Objective: Vital signs in last 24 hours: Temp:  [97.7 F (36.5 C)-98.5 F (36.9 C)] 98.5 F (36.9 C) (06/11 0526) Pulse Rate:  [68-82] 68 (06/11 0526) Resp:  [10-20] 18 (06/11 0526) BP: (109-155)/(49-78) 109/55 mmHg (06/11 0526) SpO2:  [93 %-100 %] 100 % (06/11 0526) Last BM Date: 06/28/12  Intake/Output from previous day: 06/10 0701 - 06/11 0700 In: 0  Out: 1250 [Urine:1250] Intake/Output this shift:    PE: afeb; VSS Site of drain intact; clean and dry; NT; no bleeding Output minimal; blood tinged and serous Not recorded 20 cc in bag Cx- No growth in 1 day  Lab Results:   Recent Labs  06/29/12 0926 06/30/12 0702  WBC 11.0* 8.4  HGB 12.2 11.5*  HCT 37.6 35.4*  PLT 331 306   BMET  Recent Labs  06/29/12 0926 06/30/12 0702  NA 139 137  K 3.6 3.5  CL 105 105  CO2 20 22  GLUCOSE 123* 123*  BUN 14 6  CREATININE 0.80 0.77  CALCIUM 9.3 8.8   PT/INR  Recent Labs  06/29/12 1631  LABPROT 14.5  INR 1.15   ABG No results found for this basename: PHART, PCO2, PO2, HCO3,  in the last 72 hours  Studies/Results: Ct Abdomen Pelvis W Contrast  06/29/2012   *RADIOLOGY REPORT*  Clinical Data: Right-sided pain.  Follow-up diverticulitis with abscess.  CT ABDOMEN AND PELVIS WITH CONTRAST  Technique:  Multidetector CT imaging of the abdomen and pelvis was performed following the standard protocol during bolus administration of intravenous contrast.  Contrast: OMNIPAQUE IOHEXOL 300 MG/ML  SOLN  Comparison: 06/20/2012  Findings: Lung bases are clear.  No pleural or pericardial fluid.  The liver has a normal appearance without focal lesions or biliary ductal dilatation.  No calcified gallstones.  The spleen is normal. The pancreas is normal.  The adrenal glands are normal.  The kidneys are normal.  The aorta and IVC are normal.  There is been enlargement of the abscess in the right pelvis which  is largely gas containing.  This measures 4.8 x 5.9 cm transversely as opposed to 2.9 x 4.3 cm on the previous study. Regional inflammatory change adjacent to the abscess appears quite similar. No qualitatively new finding.  IMPRESSION: Enlargement of the diverticular abscess in the right pelvis now measuring 4.8 x 5.9 cm and largely filled with gas.   Original Report Authenticated By: Paulina Fusi, M.D.   Ct Image Guided Drainage Percut Cath  Peritoneal Retroperit  06/30/2012   *RADIOLOGY REPORT*  Clinical data:  Right pelvic diverticular abscess  CT GUIDED PELVIC ABSCESS DRAINAGE CATHETER PLACEMENT  Technique and findings: The procedure, risks (including but not limited to bleeding, infection, organ damage), benefits, and alternatives were explained to the patient.  Questions regarding the procedure were encouraged and answered.  The patient understands and consents to the procedure.Survey scans through the lower pelvis were obtained and an appropriate skin entry site was identified. Operator donned sterile gloves and mask.   Site was marked, prepped with Betadine, draped in usual sterile fashion, infiltrated locally with 1% lidocaine.  Intravenous Fentanyl and Versed were administered as conscious sedation during continuous cardiorespiratory monitoring by the radiology RN, with a total moderate sedation time of 22 minutes.  Under CT fluoroscopic guidance, a curved 21 gauge trocar needle was advanced into the collection.  Gas spontaneously returned through the needle hub.  A 018 guide wire advanced easily  within the collection, its position confirmed on CT fluoroscopy.  The 6-French transitional dilator was advanced into the collection, position confirmed on CT.  This was exchanged over an Amplatz wire for serial vascular dilators, facilitating placement of a 12-French pigtail catheter, formed centrally within the collection.  A scant amount of bloody purulent material was aspirated, sent for routine Gram stain,  culture and sensitivity.  The catheter was secured externally with O-Prolene suture and Statlock, and placed to gravity drain bag. The patient tolerated the procedure well.  No immediate complication.  IMPRESSION: Technically successful pelvic abscess drainage catheter placement under CT.   Original Report Authenticated By: D. Andria Rhein, MD    Anti-infectives: Anti-infectives   Start     Dose/Rate Route Frequency Ordered Stop   06/29/12 1430  metroNIDAZOLE (FLAGYL) IVPB 500 mg     500 mg 100 mL/hr over 60 Minutes Intravenous Every 8 hours 06/29/12 1423     06/29/12 1430  ciprofloxacin (CIPRO) IVPB 400 mg     400 mg 200 mL/hr over 60 Minutes Intravenous Every 12 hours 06/29/12 1423        Assessment/Plan: s/p * No surgery found *  RLQ diverticular abscess drain placed 6/10 Better Output moderate to minimal Need to record output- re ordered Will follow Plan per [facility]   LOS: 2 days    Emma Henderson A 07/01/2012

## 2012-07-02 NOTE — Progress Notes (Signed)
  Subjective: Pt doing okay, appetite is okay.  Did well with full liquid dinner, eating breakfast now.  Abdominal pain has improved, RLQ.  Denies n/v.  BM yesterday, loose.  Objective: Vital signs in last 24 hours: Temp:  [97.7 F (36.5 C)-100.4 F (38 C)] 98.7 F (37.1 C) (06/12 0545) Pulse Rate:  [64-87] 87 (06/12 0545) Resp:  [16-18] 17 (06/12 0545) BP: (120-148)/(63-88) 139/64 mmHg (06/12 0545) SpO2:  [95 %-100 %] 99 % (06/12 0545) Last BM Date: 07/01/12  Intake/Output from previous day: 06/11 0701 - 06/12 0700 In: 1220 [P.O.:420; I.V.:800] Out: 10 [Drains:10] Intake/Output this shift:    PE:  Gen: Alert and oriented.  Calm and cooperative.  NAD.  VSS. Lungs: CTA no wheezes or crackles. No cyanosis or chest wall tenderness. Cardio: S1S2 RRR no murmurs rubs or gallops. Abd: Soft, NT/ND, +BS, no HSM, drain in RLQ, no erythema/edema, serosanguinous output Extremities: +2 pulses, no edema, skin is warm, dry and intact.  Lab Results:   Recent Labs  06/29/12 0926 06/30/12 0702  WBC 11.0* 8.4  HGB 12.2 11.5*  HCT 37.6 35.4*  PLT 331 306   BMET  Recent Labs  06/29/12 0926 06/30/12 0702  NA 139 137  K 3.6 3.5  CL 105 105  CO2 20 22  GLUCOSE 123* 123*  BUN 14 6  CREATININE 0.80 0.77  CALCIUM 9.3 8.8   PT/INR  Recent Labs  06/29/12 1631  LABPROT 14.5  INR 1.15    Anti-infectives: Anti-infectives   Start     Dose/Rate Route Frequency Ordered Stop   06/29/12 1430  metroNIDAZOLE (FLAGYL) IVPB 500 mg     500 mg 100 mL/hr over 60 Minutes Intravenous Every 8 hours 06/29/12 1423     06/29/12 1430  ciprofloxacin (CIPRO) IVPB 400 mg     400 mg 200 mL/hr over 60 Minutes Intravenous Every 12 hours 06/29/12 1423        Assessment/Plan: Abdominal abscess, abdominal pain, Perforated Diverticulitis  1. IR drain placement 6/10.  10ml of serosanguinous output yesterday.  Discuss with Dr. Magnus Ivan plan of care(dc drain vs continue with repeat ct and office  follow up next week) -Culture is pending -continue with cipro/flagyl -advance to low fiber diet -DV IVF, stop when oral intake improves. -nutritional educational consult -ambulate, SCDs, heparin -pt reports having a colonoscopy 6-7 years ago, may need outpatient colonoscopy  Leukocytosis -resolved Nausea -resolved   LOS: 3 days   Emma Henderson, [facility] ANP-BC Pager [phone]  07/02/2012 9:00 AM

## 2012-07-02 NOTE — Plan of Care (Signed)
Problem: Food- and Nutrition-Related Knowledge Deficit (NB-1.1) Goal: Nutrition education Formal process to instruct or train a patient/client in a skill or to impart knowledge to help patients/clients voluntarily manage or modify food choices and eating behavior to maintain or improve health. Outcome: Completed/Met Date Met:  07/02/12 Nutrition Education Note  RD consulted for nutrition education regarding Nutrition Management for Diverticulosis/Diverticulitis.   RD provided both "Low Fiber Nutrition Therapy" and "High Fiber Nutrition Therapy" handout from the [employer] to pt and daughter.  Explained reasons to follow Low Fiber diet for the next 6-8 weeks and discussed ways to achieve.  Discussed best practice for long-term management of diverticulosis is a High Fiber diet and discussed ways to increase fiber content in an appropriate time frame. Encouraged fresh fruits and vegetables as well as whole grain sources of carbohydrates to maximize fiber intake.  Encouraged fluid intake. Discussed symptom management should abdominal pain occur while on high fiber diet.  Pt verbalizes understanding of information provided.  Expect good compliance.  Body mass index is 31.04 kg/(m^2). Pt meets criteria for obese class 1 based on current BMI.  Current diet order is Dysphagia 3, pt will receive first meal at lunch.   Has been tolerating a full liquid diet. Labs and medications reviewed. No further nutrition interventions warranted at this time. RD contact information provided. If additional nutrition issues arise, please re-consult RD.  Loyce Dys, MS RD LDN Clinical Inpatient Dietitian Pager: [phone] Weekend/After hours pager: [phone]

## 2012-07-02 NOTE — Progress Notes (Signed)
I have seen and examined the patient and agree with the assessment and plans. Low grade temps.  Abdomen benign on exam.  Minimal drain output. Advancing diet.  Will monitor drain to make sure fistula is not developing  Emma Henderson A. Magnus Ivan  MD, FACS

## 2012-07-02 NOTE — Progress Notes (Signed)
Subjective: Pt feels ok. Wants to go home. Hasn't eaten much, though diet was just advanced. Denies much pain at drain site.   Objective: Physical Exam: BP 139/64  Pulse 87  Temp(Src) 98.7 F (37.1 C) (Oral)  Resp 17  Ht 5\' 3"  (1.6 m)  Wt 150 lb 9.2 oz (68.3 kg)  BMI 26.68 kg/m2  SpO2 99%  LMP 01/21/1998 RLQ drain intact, site clean, NT Output serosanguinous, about 10cc recorded yesterday.   Labs: CBC  Recent Labs  06/29/12 0926 06/30/12 0702  WBC 11.0* 8.4  HGB 12.2 11.5*  HCT 37.6 35.4*  PLT 331 306   BMET  Recent Labs  06/29/12 0926 06/30/12 0702  NA 139 137  K 3.6 3.5  CL 105 105  CO2 20 22  GLUCOSE 123* 123*  BUN 14 6  CREATININE 0.80 0.77  CALCIUM 9.3 8.8   LFT  Recent Labs  06/29/12 0926  PROT 7.2  ALBUMIN 3.2*  AST 18  ALT 18  ALKPHOS 81  BILITOT 0.3   PT/INR  Recent Labs  06/29/12 1631  LABPROT 14.5  INR 1.15     Studies/Results: Ct Image Guided Drainage Percut Cath  Peritoneal Retroperit  06/30/2012   *RADIOLOGY REPORT*  Clinical data:  Right pelvic diverticular abscess  CT GUIDED PELVIC ABSCESS DRAINAGE CATHETER PLACEMENT  Technique and findings: The procedure, risks (including but not limited to bleeding, infection, organ damage), benefits, and alternatives were explained to the patient.  Questions regarding the procedure were encouraged and answered.  The patient understands and consents to the procedure.Survey scans through the lower pelvis were obtained and an appropriate skin entry site was identified. Operator donned sterile gloves and mask.   Site was marked, prepped with Betadine, draped in usual sterile fashion, infiltrated locally with 1% lidocaine.  Intravenous Fentanyl and Versed were administered as conscious sedation during continuous cardiorespiratory monitoring by the radiology RN, with a total moderate sedation time of 22 minutes.  Under CT fluoroscopic guidance, a curved 21 gauge trocar needle was advanced into the  collection.  Gas spontaneously returned through the needle hub.  A 018 guide wire advanced easily within the collection, its position confirmed on CT fluoroscopy.  The 6-French transitional dilator was advanced into the collection, position confirmed on CT.  This was exchanged over an Amplatz wire for serial vascular dilators, facilitating placement of a 12-French pigtail catheter, formed centrally within the collection.  A scant amount of bloody purulent material was aspirated, sent for routine Gram stain, culture and sensitivity.  The catheter was secured externally with O-Prolene suture and Statlock, and placed to gravity drain bag. The patient tolerated the procedure well.  No immediate complication.  IMPRESSION: Technically successful pelvic abscess drainage catheter placement under CT.   Original Report Authenticated By: D. Andria Rhein, MD    Assessment/Plan: RLQ diverticular abscess drain placed 6/10 MOnitor output Repeat CT at about 1 week from placement unless output increases, can be scheduled as outpt if pt discharged before that time. WBC down.     LOS: 3 days    Brayton El PA-C 07/02/2012 8:50 AM

## 2012-07-02 NOTE — ED Provider Notes (Signed)
I saw and evaluated the patient, reviewed the resident's note and I agree with the findings and plan.diverticulitis with worsening abscess. Admit to generalsurgery  Harrold Donath R. Rubin Payor, MD 07/02/12 1410

## 2012-07-03 LAB — CBC
HCT: 33.9 % — ABNORMAL LOW (ref 36.0–46.0)
Hemoglobin: 11.1 g/dL — ABNORMAL LOW (ref 12.0–15.0)
MCV: 84.1 fL (ref 78.0–100.0)
RBC: 4.03 MIL/uL (ref 3.87–5.11)
WBC: 13.4 10*3/uL — ABNORMAL HIGH (ref 4.0–10.5)

## 2012-07-03 MED ORDER — METRONIDAZOLE 500 MG PO TABS
500.0000 mg | ORAL_TABLET | Freq: Three times a day (TID) | ORAL | Status: DC
  Administered 2012-07-03 – 2012-07-04 (×4): 500 mg via ORAL
  Filled 2012-07-03 (×7): qty 1

## 2012-07-03 MED ORDER — CIPROFLOXACIN HCL 500 MG PO TABS
500.0000 mg | ORAL_TABLET | Freq: Two times a day (BID) | ORAL | Status: DC
  Administered 2012-07-03 – 2012-07-04 (×3): 500 mg via ORAL
  Filled 2012-07-03 (×5): qty 1

## 2012-07-03 NOTE — Progress Notes (Signed)
  Subjective: RLQ divertic abscess drain placed 6/10 Doing well; ambulating Serous output  Objective: Vital signs in last 24 hours: Temp:  [98.7 F (37.1 C)-99.4 F (37.4 C)] 99 F (37.2 C) (06/13 0551) Pulse Rate:  [74-86] 78 (06/13 0551) Resp:  [18] 18 (06/13 0551) BP: (126-143)/(63-97) 143/67 mmHg (06/13 0551) SpO2:  [95 %-98 %] 95 % (06/13 0551) Last BM Date: 07/03/12  Intake/Output from previous day: 06/12 0701 - 06/13 0700 In: 600 [I.V.:600] Out: 15 [Drains:15] Intake/Output this shift:    PE:  Low grade temp- 99 VSS Wbc 13.4 today Cx: no growth Output 15 cc yesterday Minimal in bag now Site clean and dry   Lab Results:   Recent Labs  07/03/12 0520  WBC 13.4*  HGB 11.1*  HCT 33.9*  PLT 336   BMET No results found for this basename: NA, K, CL, CO2, GLUCOSE, BUN, CREATININE, CALCIUM,  in the last 72 hours PT/INR No results found for this basename: LABPROT, INR,  in the last 72 hours ABG No results found for this basename: PHART, PCO2, PO2, HCO3,  in the last 72 hours  Studies/Results: No results found.  Anti-infectives: Anti-infectives   Start     Dose/Rate Route Frequency Ordered Stop   07/03/12 0845  ciprofloxacin (CIPRO) tablet 500 mg     500 mg Oral 2 times daily 07/03/12 0832     07/03/12 0845  metroNIDAZOLE (FLAGYL) tablet 500 mg     500 mg Oral 3 times per day 07/03/12 0832     06/29/12 1430  metroNIDAZOLE (FLAGYL) IVPB 500 mg  Status:  Discontinued     500 mg 100 mL/hr over 60 Minutes Intravenous Every 8 hours 06/29/12 1423 07/03/12 0832   06/29/12 1430  ciprofloxacin (CIPRO) IVPB 400 mg  Status:  Discontinued     400 mg 200 mL/hr over 60 Minutes Intravenous Every 12 hours 06/29/12 1423 07/03/12 0832      Assessment/Plan: s/p * No surgery found *  RLQ drain placed 6/10 Output minimal- serous Cx; no growth so far Afeb; but wbc 13.4 today Plan per CCS Consider drain inj/pull soon   LOS: 4 days    Davarius Ridener  A 07/03/2012

## 2012-07-03 NOTE — Progress Notes (Signed)
I have seen and examined the patient and agree with the assessment and plans. I want to make sure she tolerates oral antibiotics and that her WBC is trending down before discharge.  Klarisa Barman A. Magnus Ivan  MD, FACS

## 2012-07-03 NOTE — Progress Notes (Signed)
  Subjective: Pt doing much better today, walking in hallways, OTF yesterday with daughter.  Nausea has resolved.  Reports "twinge" of pain to RLQ, duration is several seconds and then resolves.  Denies n/v.  Loose stools x2 yesterday.  Denies fever, chills or sweats.  Objective: Vital signs in last 24 hours: Temp:  [98.7 F (37.1 C)-99.4 F (37.4 C)] 99 F (37.2 C) (06/13 0551) Pulse Rate:  [74-86] 78 (06/13 0551) Resp:  [18] 18 (06/13 0551) BP: (126-143)/(63-97) 143/67 mmHg (06/13 0551) SpO2:  [95 %-98 %] 95 % (06/13 0551) Last BM Date: 07/02/12  Intake/Output from previous day: 06/12 0701 - 06/13 0700 In: 600 [I.V.:600] Out: 15 [Drains:15] Intake/Output this shift:   PE:  Gen: Alert and oriented. Calm and cooperative. NAD. VSS.  Lungs: CTA no wheezes or crackles. No cyanosis or chest wall tenderness.  Cardio: S1S2 RRR no murmurs rubs or gallops.  Abd: Soft, NT/ND, +BS, no HSM, drain in RLQ, no erythema/edema, minimal serosanguinous output  Extremities: +2 pulses, no edema, skin is warm, dry and intact.   Lab Results:   Recent Labs  07/03/12 0520  WBC 13.4*  HGB 11.1*  HCT 33.9*  PLT 336    Anti-infectives: Anti-infectives   Start     Dose/Rate Route Frequency Ordered Stop   07/03/12 0845  ciprofloxacin (CIPRO) tablet 500 mg     500 mg Oral 2 times daily 07/03/12 0832     07/03/12 0845  metroNIDAZOLE (FLAGYL) tablet 500 mg     500 mg Oral 3 times per day 07/03/12 0832     06/29/12 1430  metroNIDAZOLE (FLAGYL) IVPB 500 mg  Status:  Discontinued     500 mg 100 mL/hr over 60 Minutes Intravenous Every 8 hours 06/29/12 1423 07/03/12 0832   06/29/12 1430  ciprofloxacin (CIPRO) IVPB 400 mg  Status:  Discontinued     400 mg 200 mL/hr over 60 Minutes Intravenous Every 12 hours 06/29/12 1423 07/03/12 1610      Assessment/Plan: Abdominal abscess, abdominal pain, Perforated Diverticulitis  -IR drain placement 6/10. 15ml of serosanguinous output yesterday. There is  no evidence of stool in the drain to suggest a fistula. -Culture is pending  -change to oral cipro/flagyl to ensure she is tolerating well orally.  -tolerating low fiber diet.  She received education yesterday -DV IVF -ambulate, SCDs, heparin  -pt reports having a colonoscopy 6-7 years ago, may need outpatient colonoscopy  Leukocytosis  -resolved  Nausea  -resolved Dispo: possible discharge later today or tomorrow.  Will need home health if she is discharged with the drain.    LOS: 4 days    Bonner Puna [facility] ANP-BC Pager [phone]  07/03/2012 8:38 AM

## 2012-07-04 ENCOUNTER — Encounter (HOSPITAL_COMMUNITY): Payer: Self-pay | Admitting: General Surgery

## 2012-07-04 DIAGNOSIS — K5780 Diverticulitis of intestine, part unspecified, with perforation and abscess without bleeding: Secondary | ICD-10-CM

## 2012-07-04 DIAGNOSIS — M8580 Other specified disorders of bone density and structure, unspecified site: Secondary | ICD-10-CM | POA: Diagnosis present

## 2012-07-04 DIAGNOSIS — K429 Umbilical hernia without obstruction or gangrene: Secondary | ICD-10-CM

## 2012-07-04 HISTORY — DX: Diverticulitis of intestine, part unspecified, with perforation and abscess without bleeding: K57.80

## 2012-07-04 HISTORY — DX: Umbilical hernia without obstruction or gangrene: K42.9

## 2012-07-04 LAB — CBC
HCT: 33.4 % — ABNORMAL LOW (ref 36.0–46.0)
Hemoglobin: 11.4 g/dL — ABNORMAL LOW (ref 12.0–15.0)
MCH: 28.3 pg (ref 26.0–34.0)
MCHC: 34.1 g/dL (ref 30.0–36.0)
MCV: 82.9 fL (ref 78.0–100.0)
RBC: 4.03 MIL/uL (ref 3.87–5.11)

## 2012-07-04 LAB — CULTURE, ROUTINE-ABSCESS

## 2012-07-04 MED ORDER — AMOXICILLIN-POT CLAVULANATE 875-125 MG PO TABS
1.0000 | ORAL_TABLET | Freq: Two times a day (BID) | ORAL | Status: DC
  Administered 2012-07-04 – 2012-07-07 (×6): 1 via ORAL
  Filled 2012-07-04 (×7): qty 1

## 2012-07-04 MED ORDER — AMOXICILLIN-POT CLAVULANATE 875-125 MG PO TABS: 1.0000 | ORAL_TABLET | Freq: Two times a day (BID) | ORAL | Status: DC

## 2012-07-04 NOTE — Progress Notes (Signed)
  Subjective: Feels well wants to go home.  Objective: Vital signs in last 24 hours: Temp:  [97.8 F (36.6 C)-98.4 F (36.9 C)] 97.8 F (36.6 C) (06/14 0506) Pulse Rate:  [72-77] 72 (06/14 0506) Resp:  [18] 18 (06/14 0506) BP: (129-155)/(62-75) 134/65 mmHg (06/14 0506) SpO2:  [99 %-100 %] 100 % (06/14 0506) Last BM Date: 07/03/12  Intake/Output from previous day: 06/13 0701 - 06/14 0700 In: 360 [P.O.:360] Out: 17.5 [Drains:17.5] Intake/Output this shift:    GI: soft NT no rebound.  drain thick red yellow material.  Lab Results:   Recent Labs  07/03/12 0520 07/04/12 0540  WBC 13.4* 11.3*  HGB 11.1* 11.4*  HCT 33.9* 33.4*  PLT 336 355   BMET No results found for this basename: NA, K, CL, CO2, GLUCOSE, BUN, CREATININE, CALCIUM,  in the last 72 hours PT/INR No results found for this basename: LABPROT, INR,  in the last 72 hours ABG No results found for this basename: PHART, PCO2, PO2, HCO3,  in the last 72 hours  Studies/Results: No results found.  Anti-infectives: Anti-infectives   Start     Dose/Rate Route Frequency Ordered Stop   07/04/12 1000  amoxicillin-clavulanate (AUGMENTIN) 875-125 MG per tablet 1 tablet     1 tablet Oral Every 12 hours 07/04/12 0953     07/03/12 0845  ciprofloxacin (CIPRO) tablet 500 mg  Status:  Discontinued     500 mg Oral 2 times daily 07/03/12 0832 07/04/12 0953   07/03/12 0845  metroNIDAZOLE (FLAGYL) tablet 500 mg  Status:  Discontinued     500 mg Oral 3 times per day 07/03/12 0832 07/04/12 0953   06/29/12 1430  metroNIDAZOLE (FLAGYL) IVPB 500 mg  Status:  Discontinued     500 mg 100 mL/hr over 60 Minutes Intravenous Every 8 hours 06/29/12 1423 07/03/12 0832   06/29/12 1430  ciprofloxacin (CIPRO) IVPB 400 mg  Status:  Discontinued     400 mg 200 mL/hr over 60 Minutes Intravenous Every 12 hours 06/29/12 1423 07/03/12 0832      Assessment/Plan: Diverticular abscess with percutaneous drainage. WBC better Change to augmentin  from cipro  / flagyl Check CBC in am.  If normal will D/C home with drain in place and can study next week as outpatient prior to pulling. Clinically looks well and is tolerating diet.   LOS: 5 days    Emma Henderson A. 07/04/2012

## 2012-07-04 NOTE — Progress Notes (Signed)
  Subjective: Wants to go home and the beach next week.  WBC still up drainage is clear.  Objective: Vital signs in last 24 hours: Temp:  [97.8 F (36.6 C)-98.4 F (36.9 C)] 97.8 F (36.6 C) (06/14 0506) Pulse Rate:  [72-77] 72 (06/14 0506) Resp:  [18] 18 (06/14 0506) BP: (129-155)/(62-75) 134/65 mmHg (06/14 0506) SpO2:  [99 %-100 %] 100 % (06/14 0506) Last BM Date: 07/03/12 Drain 17.5 ml,+BM, Afebrile, VSS, WBC 11.3 Better thanb yesterday Intake/Output from previous day: 06/13 0701 - 06/14 0700 In: 360 [P.O.:360] Out: 17.5 [Drains:17.5] Intake/Output this shift:    General appearance: alert, cooperative and no distress GI: soft, non-tender; bowel sounds normal; no masses,  no organomegaly and drainage is clear, examined by Dr. Luisa Hart  Lab Results:   Recent Labs  07/03/12 0520 07/04/12 0540  WBC 13.4* 11.3*  HGB 11.1* 11.4*  HCT 33.9* 33.4*  PLT 336 355    BMET No results found for this basename: NA, K, CL, CO2, GLUCOSE, BUN, CREATININE, CALCIUM,  in the last 72 hours PT/INR No results found for this basename: LABPROT, INR,  in the last 72 hours   Recent Labs Lab 06/29/12 0926  AST 18  ALT 18  ALKPHOS 81  BILITOT 0.3  PROT 7.2  ALBUMIN 3.2*     Lipase     Component Value Date/Time   LIPASE 20 06/19/2012 2322     Studies/Results: No results found.  Medications: . ciprofloxacin  500 mg Oral BID  . heparin subcutaneous  5,000 Units Subcutaneous Q8H  . metroNIDAZOLE  500 mg Oral Q8H    Assessment/Plan Abdominal abscess, abdominal pain, Perforated Diverticulitis  S/p drain placement 06/30/12. 2.Osteopenia  3. Umbilical Hernia   Plan;  Dr. Luisa Hart reviewing antibiotics and will decide about going home tomorrow.  LOS: 5 days    Juliannah Ohmann 07/04/2012

## 2012-07-04 NOTE — Progress Notes (Signed)
Pt seen.  See my note

## 2012-07-05 ENCOUNTER — Inpatient Hospital Stay (HOSPITAL_COMMUNITY): Payer: Medicare Other

## 2012-07-05 ENCOUNTER — Encounter (HOSPITAL_COMMUNITY): Payer: Self-pay | Admitting: Radiology

## 2012-07-05 LAB — URINE MICROSCOPIC-ADD ON

## 2012-07-05 LAB — CBC
HCT: 37.8 % (ref 36.0–46.0)
MCH: 28.1 pg (ref 26.0–34.0)
MCV: 82.9 fL (ref 78.0–100.0)
Platelets: 409 10*3/uL — ABNORMAL HIGH (ref 150–400)
RDW: 13.6 % (ref 11.5–15.5)

## 2012-07-05 LAB — URINALYSIS, ROUTINE W REFLEX MICROSCOPIC
Bilirubin Urine: NEGATIVE
Ketones, ur: NEGATIVE mg/dL
Nitrite: NEGATIVE
Urobilinogen, UA: 0.2 mg/dL (ref 0.0–1.0)

## 2012-07-05 MED ORDER — IOHEXOL 300 MG/ML  SOLN
100.0000 mL | Freq: Once | INTRAMUSCULAR | Status: AC | PRN
  Administered 2012-07-05: 100 mL via INTRAVENOUS

## 2012-07-05 MED ORDER — IOHEXOL 300 MG/ML  SOLN
25.0000 mL | INTRAMUSCULAR | Status: AC
  Administered 2012-07-05 (×2): 25 mL via ORAL

## 2012-07-05 NOTE — Progress Notes (Signed)
Seen and agree.  Recheck CT and UA.  Discussed surgery with her since she has failed non operative management. Rising WBC and fever on ABX for over 2 weeks at this point

## 2012-07-05 NOTE — Progress Notes (Signed)
  Subjective: RLQ divertic abscess drain placed 6/10 Doing ok, denies abd pain. Frustrated at slow progress  Objective: Vital signs in last 24 hours: Temp:  [99.1 F (37.3 C)-100.6 F (38.1 C)] 100.6 F (38.1 C) (06/15 0545) Pulse Rate:  [73-85] 85 (06/15 0545) Resp:  [18] 18 (06/15 0545) BP: (133-142)/(68-78) 133/78 mmHg (06/15 0545) SpO2:  [96 %-98 %] 98 % (06/15 0545) Last BM Date: 07/03/12 (pt reports she had a small BM this am.)  Intake/Output from previous day: 06/14 0701 - 06/15 0700 In: -  Out: 25 [Drains:25] Intake/Output this shift:    PE:  Low grade temp- 100.6 VSS Drain intact, site clean, NT Scant serous fluid in bag, 10cc emptied this am   Lab Results:   Recent Labs  07/04/12 0540 07/05/12 0525  WBC 11.3* 11.9*  HGB 11.4* 12.8  HCT 33.4* 37.8  PLT 355 409*    Studies/Results: No results found.  Anti-infectives: Anti-infectives   Start     Dose/Rate Route Frequency Ordered Stop   07/04/12 2000  amoxicillin-clavulanate (AUGMENTIN) 875-125 MG per tablet 1 tablet     1 tablet Oral Every 12 hours 07/04/12 1000     07/04/12 1000  amoxicillin-clavulanate (AUGMENTIN) 875-125 MG per tablet 1 tablet  Status:  Discontinued     1 tablet Oral Every 12 hours 07/04/12 0953 07/04/12 1000   07/03/12 0845  ciprofloxacin (CIPRO) tablet 500 mg  Status:  Discontinued     500 mg Oral 2 times daily 07/03/12 0832 07/04/12 0953   07/03/12 0845  metroNIDAZOLE (FLAGYL) tablet 500 mg  Status:  Discontinued     500 mg Oral 3 times per day 07/03/12 0832 07/04/12 0953   06/29/12 1430  metroNIDAZOLE (FLAGYL) IVPB 500 mg  Status:  Discontinued     500 mg 100 mL/hr over 60 Minutes Intravenous Every 8 hours 06/29/12 1423 07/03/12 0832   06/29/12 1430  ciprofloxacin (CIPRO) IVPB 400 mg  Status:  Discontinued     400 mg 200 mL/hr over 60 Minutes Intravenous Every 12 hours 06/29/12 1423 07/03/12 0832      Assessment/Plan: RLQ drain placed 6/10 Output minimal- serous Low  grade fever Plan per [facility]CS    LOS: 6 days    Brayton El 07/05/2012

## 2012-07-05 NOTE — Progress Notes (Signed)
   CARE MANAGEMENT NOTE 07/05/2012  Patient:  Emma Henderson, Emma Henderson   Account Number:  [id number]  Date Initiated:  06/30/2012  Documentation initiated by:  Ronny Flurry  Subjective/Objective Assessment:   06-30-12 Dx: Colonic diverticular abscess [562.11]  CT pelvic abscess drain placement     Action/Plan:   pt will dc to dtr's home in [place] # [phone]   Anticipated DC Date:     Anticipated DC Plan:  HOME W HOME HEALTH SERVICES      DC Planning Services  CM consult      Choice offered to / List presented to:             Status of service:  In process, will continue to follow Medicare Important Message given?   (If response is "NO", the following Medicare IM given date fields will be blank) Date Medicare IM given:   Date Additional Medicare IM given:    Discharge Disposition:    Per UR Regulation:    If discussed at Long Length of Stay Meetings, dates discussed:    Comments:  07/05/2012 1150 NCM spoke to pt and states she is not dc home today. She may need surgery. NCM will continue to follow until dc for dc needs. Will need MD to complete HH orders. Isidoro Donning RN CCM Case Mgmt phone [phone]  07-03-12 Checked with NP this afternoon regarding need for home health . Unsure at this point. Continue to follow . Ronny Flurry RN BSN [phone]

## 2012-07-05 NOTE — Progress Notes (Signed)
  Subjective: Feels ok, maybe weaker,  Depressed some.    Objective: Vital signs in last 24 hours: Temp:  [99 F (37.2 C)-100.6 F (38.1 C)] 99 F (37.2 C) (06/15 0854) Pulse Rate:  [73-85] 85 (06/15 0545) Resp:  [18] 18 (06/15 0545) BP: (133-142)/(68-78) 133/78 mmHg (06/15 0545) SpO2:  [96 %-98 %] 98 % (06/15 0545) Last BM Date: 07/03/12 Diet:  Low fiber, Tm 100.6@5AM  WBC is still up to 11.9  Intake/Output from previous day: 06/14 0701 - 06/15 0700 In: -  Out: 25 [Drains:25] Intake/Output this shift:    General appearance: alert, cooperative and no distress Resp: clear to auscultation bilaterally GI: soft, tender around the drain.  otherwise OK, Drain bag is empty, but has some thick white colored fluid in it as residual.  Lab Results:   Recent Labs  07/04/12 0540 07/05/12 0525  WBC 11.3* 11.9*  HGB 11.4* 12.8  HCT 33.4* 37.8  PLT 355 409*    BMET No results found for this basename: NA, K, CL, CO2, GLUCOSE, BUN, CREATININE, CALCIUM,  in the last 72 hours PT/INR No results found for this basename: LABPROT, INR,  in the last 72 hours   Recent Labs Lab 06/29/12 0926  AST 18  ALT 18  ALKPHOS 81  BILITOT 0.3  PROT 7.2  ALBUMIN 3.2*     Lipase     Component Value Date/Time   LIPASE 20 06/19/2012 2322     Studies/Results: No results found.  Medications: . amoxicillin-clavulanate  1 tablet Oral Q12H  . heparin subcutaneous  5,000 Units Subcutaneous Q8H    Assessment/Plan Abdominal abscess, abdominal pain, Perforated Diverticulitis  S/p drain placement 06/30/12.  2.Osteopenia  3. Umbilical Hernia  Plan:  She was seen by Dr. Luisa Hart, and we will repeat CT and UA.  She does not seem to be progressing with antibiotics.      LOS: 6 days    Leanny Moeckel 07/05/2012

## 2012-07-06 LAB — CBC
HCT: 37.2 % (ref 36.0–46.0)
Hemoglobin: 12.7 g/dL (ref 12.0–15.0)
MCH: 28.2 pg (ref 26.0–34.0)
MCHC: 34.1 g/dL (ref 30.0–36.0)
RDW: 13.6 % (ref 11.5–15.5)

## 2012-07-06 LAB — BASIC METABOLIC PANEL
BUN: 10 mg/dL (ref 6–23)
Calcium: 9.4 mg/dL (ref 8.4–10.5)
Creatinine, Ser: 0.85 mg/dL (ref 0.50–1.10)
GFR calc Af Amer: 81 mL/min — ABNORMAL LOW (ref >90)
GFR calc non Af Amer: 70 mL/min — ABNORMAL LOW (ref >90)
Glucose, Bld: 93 mg/dL (ref 70–99)
Potassium: 4.3 mEq/L (ref 3.5–5.1)

## 2012-07-06 LAB — URINE CULTURE

## 2012-07-06 NOTE — Progress Notes (Signed)
Subjective: Pt states she has not felt this good since May.  No abdominal pain, n/v, +flatus, bm's.  Tolerating low residue diet.  She denies fever, chills or sweats.  She is ambulating in the hallways.    Objective: Vital signs in last 24 hours: Temp:  [97.9 F (36.6 C)-98.9 F (37.2 C)] 98.2 F (36.8 C) (06/16 0538) Pulse Rate:  [82-88] 82 (06/16 0538) Resp:  [16] 16 (06/16 0538) BP: (132-134)/(63-72) 132/71 mmHg (06/16 0538) SpO2:  [99 %] 99 % (06/16 0538) Last BM Date: 07/06/12  Intake/Output from previous day: 06/15 0701 - 06/16 0700 In: 960 [P.O.:240] Out: 5 [Drains:5] Intake/Output this shift:   PE:  Gen: Alert and oriented. Calm and cooperative. NAD. VSS.  Lungs: CTA no wheezes or crackles. No cyanosis or chest wall tenderness.  Cardio: S1S2 RRR no murmurs rubs or gallops.  Abd: Soft, NT/ND, +BS, no HSM, drain in RLQ, no erythema/edema, minimal serous output  Extremities: +2 pulses, no edema, skin is warm, dry and intact.  Lab Results:   Recent Labs  07/04/12 0540 07/05/12 0525  WBC 11.3* 11.9*  HGB 11.4* 12.8  HCT 33.4* 37.8  PLT 355 409*    Studies/Results: Ct Abdomen Pelvis W Contrast  07/06/2012   *RADIOLOGY REPORT*  Clinical Data: Perforated diverticulitis.  Follow-up diverticular abscess.  Worsening fevers and leukocytosis.  CT ABDOMEN AND PELVIS WITH CONTRAST  Technique:  Multidetector CT imaging of the abdomen and pelvis was performed following the standard protocol during bolus administration of intravenous contrast.  Contrast: OMNIPAQUE IOHEXOL 300 MG/ML  SOLN  Comparison: 06/29/2012  Findings: Interval placement of a percutaneous drainage catheter in the right pelvic collection is seen.  This collection now contains both fluid and gas and has decreased in size, now measuring 3.0 x 3.8 cm compared to 3.7 x 5.3 cm on prior study.  Diverticulosis is again seen involving the sigmoid colon, however there is no significant bowel wall thickening.   Normal appendix is seen.  The liver, gallbladder, spleen, pancreas, adrenal glands, and kidneys are normal appearance.  No evidence of hydronephrosis.  No soft tissue masses or lymphadenopathy identified. A small periumbilical hernia is seen containing only fat.  No evidence of herniated bowel loops.  IMPRESSION:  1.  Interval decrease in size of right pelvic collection, with percutaneous drainage catheter in appropriate position. 2.  Diverticulosis, without radiographic signs of acute diverticulitis. 3.  Small periumbilical hernia containing only fat.   Original Report Authenticated By: Myles Rosenthal, M.D.    Anti-infectives: Anti-infectives   Start     Dose/Rate Route Frequency Ordered Stop   07/04/12 2000  amoxicillin-clavulanate (AUGMENTIN) 875-125 MG per tablet 1 tablet     1 tablet Oral Every 12 hours 07/04/12 1000     07/04/12 1000  amoxicillin-clavulanate (AUGMENTIN) 875-125 MG per tablet 1 tablet  Status:  Discontinued     1 tablet Oral Every 12 hours 07/04/12 0953 07/04/12 1000   07/03/12 0845  ciprofloxacin (CIPRO) tablet 500 mg  Status:  Discontinued     500 mg Oral 2 times daily 07/03/12 0832 07/04/12 0953   07/03/12 0845  metroNIDAZOLE (FLAGYL) tablet 500 mg  Status:  Discontinued     500 mg Oral 3 times per day 07/03/12 0832 07/04/12 0953   06/29/12 1430  metroNIDAZOLE (FLAGYL) IVPB 500 mg  Status:  Discontinued     500 mg 100 mL/hr over 60 Minutes Intravenous Every 8 hours 06/29/12 1423 07/03/12 0832   06/29/12 1430  ciprofloxacin (  CIPRO) IVPB 400 mg  Status:  Discontinued     400 mg 200 mL/hr over 60 Minutes Intravenous Every 12 hours 06/29/12 1423 07/03/12 9604      Assessment/Plan: Abdominal Abscess, Perforated Diverticulitis -IR drain placement 06/30/12 58ml/24h -white count and fever over the weekend, repeat CT reveals a decrease in right pelvic collection, without evidence of acute diverticulitis.  I will discuss with Dr. Andrey Campanile progression. -low residue diet -ambulate,  scds, heparin -repeat labs today -oral ATBx   LOS: 7 days    Bonner Puna [facility] ANP-BC Pager [phone]  07/06/2012 10:13 AM

## 2012-07-06 NOTE — Progress Notes (Signed)
Feel a lot better. Night and day from yesterday Tolerating diet  Soft, nd, nt  Discussed diverticulitis and management Answered numerous ? Reviewed ct with pt  If does well tonight- home in am with drain Low residue diet Cont oral abx Fu outpt  Mary Sella. Andrey Campanile, MD, FACS General, Bariatric, & Minimally Invasive Surgery [facility], [place]

## 2012-07-06 NOTE — Progress Notes (Signed)
  Subjective: RLQ divertic abscess drain placed 6/10 Output minimal Cx neg T max of 100.6 yesterday CT scan 6/15 shows decrease in collection- not gone   Objective: Vital signs in last 24 hours: Temp:  [97.9 F (36.6 C)-99 F (37.2 C)] 98.2 F (36.8 C) (06/16 0538) Pulse Rate:  [82-88] 82 (06/16 0538) Resp:  [16] 16 (06/16 0538) BP: (132-134)/(63-72) 132/71 mmHg (06/16 0538) SpO2:  [99 %] 99 % (06/16 0538) Last BM Date: 07/03/12  Intake/Output from previous day: 06/15 0701 - 06/16 0700 In: 960 [P.O.:240] Out: 5 [Drains:5] Intake/Output this shift:    PE:  Afeb; vss No cbc this am Output minimal from drain Serous fluid Cx neg CT 6/15 shows improvement  Lab Results:   Recent Labs  07/04/12 0540 07/05/12 0525  WBC 11.3* 11.9*  HGB 11.4* 12.8  HCT 33.4* 37.8  PLT 355 409*   BMET No results found for this basename: NA, K, CL, CO2, GLUCOSE, BUN, CREATININE, CALCIUM,  in the last 72 hours PT/INR No results found for this basename: LABPROT, INR,  in the last 72 hours ABG No results found for this basename: PHART, PCO2, PO2, HCO3,  in the last 72 hours  Studies/Results: Ct Abdomen Pelvis W Contrast  07/06/2012   *RADIOLOGY REPORT*  Clinical Data: Perforated diverticulitis.  Follow-up diverticular abscess.  Worsening fevers and leukocytosis.  CT ABDOMEN AND PELVIS WITH CONTRAST  Technique:  Multidetector CT imaging of the abdomen and pelvis was performed following the standard protocol during bolus administration of intravenous contrast.  Contrast: OMNIPAQUE IOHEXOL 300 MG/ML  SOLN  Comparison: 06/29/2012  Findings: Interval placement of a percutaneous drainage catheter in the right pelvic collection is seen.  This collection now contains both fluid and gas and has decreased in size, now measuring 3.0 x 3.8 cm compared to 3.7 x 5.3 cm on prior study.  Diverticulosis is again seen involving the sigmoid colon, however there is no significant bowel wall thickening.   Normal appendix is seen.  The liver, gallbladder, spleen, pancreas, adrenal glands, and kidneys are normal appearance.  No evidence of hydronephrosis.  No soft tissue masses or lymphadenopathy identified. A small periumbilical hernia is seen containing only fat.  No evidence of herniated bowel loops.  IMPRESSION:  1.  Interval decrease in size of right pelvic collection, with percutaneous drainage catheter in appropriate position. 2.  Diverticulosis, without radiographic signs of acute diverticulitis. 3.  Small periumbilical hernia containing only fat.   Original Report Authenticated By: Myles Rosenthal, M.D.    Anti-infectives:   Assessment/Plan: s/p * No surgery found *  RLQ divertic abscess drain placed 6/10 Intact CT 6/15: decrease in size Plan CCS   LOS: 7 days    Nidal Rivet A 07/06/2012

## 2012-07-06 NOTE — Care Management Note (Signed)
  Page 2 of 2   07/06/2012     9:41:57 AM   CARE MANAGEMENT NOTE 07/06/2012  Patient:  Emma Henderson, Emma Henderson   Account Number:  [id number]  Date Initiated:  06/30/2012  Documentation initiated by:  Ronny Flurry  Subjective/Objective Assessment:   06-30-12 Dx: Colonic diverticular abscess [562.11]  CT pelvic abscess drain placement     Action/Plan:   pt will dc to dtr's home in [place] # [phone]   Anticipated DC Date:  07/06/2012   Anticipated DC Plan:  HOME W HOME HEALTH SERVICES      DC Planning Services  CM consult      Choice offered to / List presented to:  C-1 Patient        HH arranged  HH-1 RN      [facility] agency  [facility]   Status of service:  Completed, signed off Medicare Important Message given?   (If response is "NO", the following Medicare IM given date fields will be blank) Date Medicare IM given:   Date Additional Medicare IM given:    Discharge Disposition:    Per UR Regulation:    If discussed at Long Length of Stay Meetings, dates discussed:    Comments:  07-06-12 Face sheet information confirmed with patient . Ronny Flurry RN BSN   07/05/2012 1150 NCM spoke to pt and states she is not dc home today. She may need surgery. NCM will continue to follow until dc for dc needs. Will need MD to complete HH orders. Isidoro Donning RN CCM Case Mgmt phone [phone]  07-03-12 Checked with NP this afternoon regarding need for home health . Unsure at this point. Continue to follow . Ronny Flurry RN BSN [phone]

## 2012-07-07 ENCOUNTER — Encounter (INDEPENDENT_AMBULATORY_CARE_PROVIDER_SITE_OTHER): Payer: Medicare Other

## 2012-07-07 LAB — CBC
MCH: 27.7 pg (ref 26.0–34.0)
MCHC: 33.6 g/dL (ref 30.0–36.0)
MCV: 82.6 fL (ref 78.0–100.0)
Platelets: 461 10*3/uL — ABNORMAL HIGH (ref 150–400)
RDW: 13.6 % (ref 11.5–15.5)

## 2012-07-07 MED ORDER — HYDROCODONE-ACETAMINOPHEN 5-325 MG PO TABS: 1.0000 | ORAL_TABLET | Freq: Four times a day (QID) | ORAL | Status: AC | PRN

## 2012-07-07 MED ORDER — AMOXICILLIN-POT CLAVULANATE 875-125 MG PO TABS: 1.0000 | ORAL_TABLET | Freq: Two times a day (BID) | ORAL | Status: DC

## 2012-07-07 NOTE — Discharge Summary (Signed)
Physician Discharge Summary  Emma Henderson:[id number] DOB: 1945/12/18 DOA: 06/29/2012  PCP: Carylon Perches, MD  Consultation: Dietician   Admit date: 06/29/2012 Discharge date: 07/07/2012  Recommendations for Outpatient Follow-up:    Follow-up Information   Follow up with [facility] A, MD. (our office will contact you for an appointment next week)    Contact information:   [address] [place] [address] [phone]      Discharge Diagnoses:  1. Acute perforated diverticulitis   Surgical Procedure: none  Discharge Condition: stable Disposition: home  Diet recommendation: low residue  [facility] Weights   06/29/12 1640 06/29/12 1645  Weight: 150 lb (68.04 kg) 150 lb 9.2 oz (68.3 kg)    Filed Vitals:   07/07/12 0450  BP: 127/69  Pulse: 78  Temp: 98.7 F (37.1 C)  Resp: 18    Hospital Course:  Emma Henderson is a healthy 67 year old female who was originally admitted to [facility] on 06/21/12 for acute perforated diverticulitis.  She was treated with conservatively with IV antibiotics, she improved and was discharged on Augmentin on 06/24/12.  She presented to [facility] ED on 06/29/12 with like symptoms.  She was once again started on IV antibiotics.  She underwent CT guided drain placement on 06/30/12 by Interventional Radiology which contacted bloody purulent output, which changed to serosanguinous and finally serous, drainage was minimal and at present time she does not have any drainage.  The final culture of the abscess showed multiple organisms, but none predominant.  She was changed from IV cipro/flagyl to PO and then to Augmentin for surgeons preference.  She clinically improved and her diet was advanced.  Over the weekend, she developed a fever and leukocytosis.  She had a repeat CT scan which showed an improvement in the abscess size and no evidence of acute diverticulitis.  She was managed symptomatically, continued to consume a low fiber diet.  Her symptoms resolved the  following day and has not had any symptoms since Monday morning.  Her CBC was followed which slightly increased to 11.9 on Sunday, it has been normal since.  Her vital signs remained stable.  She was ambulating on her own in the hallways.  Her pain was under good control.  She was felt safe for discharge.  She is discharged with percutaneous drain until she follows up with Dr. Magnus Ivan.  Teaching has been done and home health has been set up for the patient.  We discussed warning signs that warrant immediate attention.  I encouraged her to call our clinic with any questions or concerns.  She is aware of her appointment with Dr. Magnus Ivan on the 23rd at 10:30.  General appearance: alert and oriented. Calm and cooperative No acute distress. VSS. Afebrile.  Resp: clear to auscultation bilaterally  Cardio: S1S1 RRR without murmurs or gallops. No edema. GI: soft round and nontender. +BS x4 quadrants. No organomegaly, hernias or masses. RLQ drain without drainage. Pulses: +2 bilateral distal pulses without cyanosis  Neurologic: Mental status: Alert, oriented, thought content appropriate   Discharge Instructions   Future Appointments Provider Department Dept Phone   07/07/2012 11:00 AM [facility] Doc Of The Week [facility], [place] [phone]       Medication List    STOP taking these medications       CALCARB 600/D PO      TAKE these medications       acetaminophen 500 MG tablet  Commonly known as:  TYLENOL  Take  500 mg by mouth every 6 (six) hours as needed for pain.     amoxicillin-clavulanate 875-125 MG per tablet  Commonly known as:  AUGMENTIN  Take 1 tablet by mouth every 12 (twelve) hours.     HYDROcodone-acetaminophen 5-325 MG per tablet  Commonly known as:  NORCO/VICODIN  Take 1 tablet by mouth every 6 (six) hours as needed for pain.           Follow-up Information   Follow up with [facility] A, MD. (our office will contact you for an appointment next week)     Contact information:   [address] [place] [address] [phone]        The results of significant diagnostics from this hospitalization (including imaging, microbiology, ancillary and laboratory) are listed below for reference.    Significant Diagnostic Studies: Ct Abdomen Pelvis W Contrast  07/06/2012   *RADIOLOGY REPORT*  Clinical Data: Perforated diverticulitis.  Follow-up diverticular abscess.  Worsening fevers and leukocytosis.  CT ABDOMEN AND PELVIS WITH CONTRAST  Technique:  Multidetector CT imaging of the abdomen and pelvis was performed following the standard protocol during bolus administration of intravenous contrast.  Contrast: OMNIPAQUE IOHEXOL 300 MG/ML  SOLN  Comparison: 06/29/2012  Findings: Interval placement of a percutaneous drainage catheter in the right pelvic collection is seen.  This collection now contains both fluid and gas and has decreased in size, now measuring 3.0 x 3.8 cm compared to 3.7 x 5.3 cm on prior study.  Diverticulosis is again seen involving the sigmoid colon, however there is no significant bowel wall thickening.  Normal appendix is seen.  The liver, gallbladder, spleen, pancreas, adrenal glands, and kidneys are normal appearance.  No evidence of hydronephrosis.  No soft tissue masses or lymphadenopathy identified. A small periumbilical hernia is seen containing only fat.  No evidence of herniated bowel loops.  IMPRESSION:  1.  Interval decrease in size of right pelvic collection, with percutaneous drainage catheter in appropriate position. 2.  Diverticulosis, without radiographic signs of acute diverticulitis. 3.  Small periumbilical hernia containing only fat.   Original Report Authenticated By: Myles Rosenthal, M.D.   Ct Abdomen Pelvis W Contrast  06/29/2012   *RADIOLOGY REPORT*  Clinical Data: Right-sided pain.  Follow-up diverticulitis with abscess.  CT ABDOMEN AND PELVIS WITH CONTRAST  Technique:  Multidetector CT imaging of the  abdomen and pelvis was performed following the standard protocol during bolus administration of intravenous contrast.  Contrast: OMNIPAQUE IOHEXOL 300 MG/ML  SOLN  Comparison: 06/20/2012  Findings: Lung bases are clear.  No pleural or pericardial fluid.  The liver has a normal appearance without focal lesions or biliary ductal dilatation.  No calcified gallstones.  The spleen is normal. The pancreas is normal.  The adrenal glands are normal.  The kidneys are normal.  The aorta and IVC are normal.  There is been enlargement of the abscess in the right pelvis which is largely gas containing.  This measures 4.8 x 5.9 cm transversely as opposed to 2.9 x 4.3 cm on the previous study. Regional inflammatory change adjacent to the abscess appears quite similar. No qualitatively new finding.  IMPRESSION: Enlargement of the diverticular abscess in the right pelvis now measuring 4.8 x 5.9 cm and largely filled with gas.   Original Report Authenticated By: Paulina Fusi, M.D.   Ct Abdomen Pelvis W Contrast  06/20/2012   *RADIOLOGY REPORT*  Clinical Data: Left lower quadrant pain.  Fever.  CT ABDOMEN AND PELVIS WITH  CONTRAST  Technique:  Multidetector CT imaging of the abdomen and pelvis was performed following the standard protocol during bolus administration of intravenous contrast.  Contrast: 50mL OMNIPAQUE IOHEXOL 300 MG/ML  SOLN, OMNIPAQUE IOHEXOL 300 MG/ML  SOLN  Comparison: None.  Findings: A small amount of free intraperitoneal air is seen.  Mild diverticulitis is seen involving the mid to distal sigmoid colon. There is an extraluminal fluid and gas collection in the right pelvis measuring 2.9 x 4.3 cm, consistent with a diverticular abscess.  Normal appendix is visualized.  No other abscess identified.  The liver, gallbladder, pancreas, spleen, adrenal glands, and kidneys are normal in appearance.  No evidence of hydronephrosis. No soft tissue masses or lymphadenopathy identified.  IMPRESSION:  1.  Sigmoid  diverticulitis, with diverticular abscess in the right pelvis measuring 4.3 cm. 2.  Free intraperitoneal air, consistent with perforated diverticulitis.  Critical Value/emergent results were called by telephone at the time of interpretation on 06/20/2012 at 0130 hours to Dr. Dierdre Highman in the emergency department, who verbally acknowledged these results.   Original Report Authenticated By: Myles Rosenthal, M.D.   Ct Image Guided Drainage Percut Cath  Peritoneal Retroperit  06/30/2012   *RADIOLOGY REPORT*  Clinical data:  Right pelvic diverticular abscess  CT GUIDED PELVIC ABSCESS DRAINAGE CATHETER PLACEMENT  Technique and findings: The procedure, risks (including but not limited to bleeding, infection, organ damage), benefits, and alternatives were explained to the patient.  Questions regarding the procedure were encouraged and answered.  The patient understands and consents to the procedure.Survey scans through the lower pelvis were obtained and an appropriate skin entry site was identified. Operator donned sterile gloves and mask.   Site was marked, prepped with Betadine, draped in usual sterile fashion, infiltrated locally with 1% lidocaine.  Intravenous Fentanyl and Versed were administered as conscious sedation during continuous cardiorespiratory monitoring by the radiology RN, with a total moderate sedation time of 22 minutes.  Under CT fluoroscopic guidance, a curved 21 gauge trocar needle was advanced into the collection.  Gas spontaneously returned through the needle hub.  A 018 guide wire advanced easily within the collection, its position confirmed on CT fluoroscopy.  The 6-French transitional dilator was advanced into the collection, position confirmed on CT.  This was exchanged over an Amplatz wire for serial vascular dilators, facilitating placement of a 12-French pigtail catheter, formed centrally within the collection.  A scant amount of bloody purulent material was aspirated, sent for routine Gram stain,  culture and sensitivity.  The catheter was secured externally with O-Prolene suture and Statlock, and placed to gravity drain bag. The patient tolerated the procedure well.  No immediate complication.  IMPRESSION: Technically successful pelvic abscess drainage catheter placement under CT.   Original Report Authenticated By: D. Andria Rhein, MD    Microbiology: Recent Results (from the past 240 hour(s))  CULTURE, ROUTINE-ABSCESS     Status: None   Collection Time    06/30/12  1:46 PM      Result Value Range Status   Specimen Description ABSCESS ABDOMEN   Final   Special Requests ABDOMEN SMALL BILE    Final   Gram Stain     Final   Value: ABUNDANT WBC PRESENT, PREDOMINANTLY PMN     NO SQUAMOUS EPITHELIAL CELLS SEEN     NO ORGANISMS SEEN   Culture     Final   Value: MULTIPLE ORGANISMS PRESENT, NONE PREDOMINANT     Note: NO STAPHYLOCOCCUS AUREUS ISOLATED NO GROUP A STREP (S.PYOGENES) ISOLATED  Report Status 07/04/2012 FINAL   Final  URINE CULTURE     Status: None   Collection Time    07/05/12 11:01 AM      Result Value Range Status   Specimen Description URINE, CLEAN CATCH   Final   Special Requests augmentin   Final   Culture  Setup Time 07/05/2012 15:19   Final   Colony Count 30,000 COLONIES/ML   Final   Culture     Final   Value: Multiple bacterial morphotypes present, none predominant. Suggest appropriate recollection if clinically indicated.   Report Status 07/06/2012 FINAL   Final     Labs: Basic Metabolic Panel:  Recent Labs Lab 07/06/12 1110  NA 136  K 4.3  CL 100  CO2 22  GLUCOSE 93  BUN 10  CREATININE 0.85  CALCIUM 9.4   CBC:  Recent Labs Lab 07/03/12 0520 07/04/12 0540 07/05/12 0525 07/06/12 1110  WBC 13.4* 11.3* 11.9* 6.4  HGB 11.1* 11.4* 12.8 12.7  HCT 33.9* 33.4* 37.8 37.2  MCV 84.1 82.9 82.9 82.5  PLT 336 355 409* 430*   Principal Problem:   Perforated diverticulitis Active Problems:   Osteopenia   Umbilical hernia   Time coordinating  discharge: 30 mins  Signed:  Petrina Melby, ANP-BC

## 2012-07-07 NOTE — Discharge Summary (Signed)
Emma Henderson M. Emma San, MD, FACS General, Bariatric, & Minimally Invasive [facility]  

## 2012-07-09 ENCOUNTER — Encounter (INDEPENDENT_AMBULATORY_CARE_PROVIDER_SITE_OTHER): Payer: Self-pay | Admitting: General Surgery

## 2012-07-09 ENCOUNTER — Ambulatory Visit (INDEPENDENT_AMBULATORY_CARE_PROVIDER_SITE_OTHER): Payer: Medicare Other | Admitting: General Surgery

## 2012-07-09 VITALS — BP 122/70 | HR 80 | Temp 97.6°F | Resp 14 | Ht 63.0 in | Wt 138.8 lb

## 2012-07-09 DIAGNOSIS — K5780 Diverticulitis of intestine, part unspecified, with perforation and abscess without bleeding: Secondary | ICD-10-CM

## 2012-07-09 DIAGNOSIS — K5732 Diverticulitis of large intestine without perforation or abscess without bleeding: Secondary | ICD-10-CM

## 2012-07-09 NOTE — Progress Notes (Signed)
Subjective:     Patient ID: Emma Henderson, female   DOB: 1945-06-25, 67 y.o.   MRN: [id number]  HPI Patient's perforated diverticulitis with pelvic abscess and has been treated with percutaneous drain. She comes to urgent clinic today due to decreased drain output. She is feeling fine. No abdominal pain. No fevers.  Review of Systems     Objective:   Physical Exam  Constitutional: She appears well-developed.  HENT:  Head: Normocephalic.  Cardiovascular: Normal rate and regular rhythm.   Pulmonary/Chest: Effort normal and breath sounds normal.  Abdominal: Soft. She exhibits no distension. There is no tenderness. There is no rebound.  Drain right lower quadrant was disconnected and flushed with 5 cc of sterile saline. History back some cloudy fluid. Drain appears well functional. It was connected back to the drainage bag.     Assessment:     Perforated diverticulitis with abscess treated with drain, draining functioning well    Plan:     I gave her prescription to refill her Sterile saline flush syringes. Followup with Dr. Magnus Ivan next week as planned.

## 2012-07-13 ENCOUNTER — Encounter (INDEPENDENT_AMBULATORY_CARE_PROVIDER_SITE_OTHER): Payer: Self-pay | Admitting: Surgery

## 2012-07-13 ENCOUNTER — Ambulatory Visit (INDEPENDENT_AMBULATORY_CARE_PROVIDER_SITE_OTHER): Payer: Medicare Other | Admitting: Surgery

## 2012-07-13 VITALS — BP 130/92 | HR 84 | Temp 98.8°F | Resp 16 | Ht 63.0 in | Wt 138.8 lb

## 2012-07-13 DIAGNOSIS — K5780 Diverticulitis of intestine, part unspecified, with perforation and abscess without bleeding: Secondary | ICD-10-CM

## 2012-07-13 DIAGNOSIS — K5732 Diverticulitis of large intestine without perforation or abscess without bleeding: Secondary | ICD-10-CM

## 2012-07-13 NOTE — Progress Notes (Signed)
Subjective:     Patient ID: Emma Henderson, female   DOB: 1945-03-20, 67 y.o.   MRN: [id number]  HPI She is here for a followup regarding her diverticular abscess. She has absolutely no complaints and is doing well. She reports the drain is not draining anything else.  She is moving her bowels well and has had no fevers. She remains on oral antibiotics   Review of Systems     Objective:   Physical Exam On exam, her abdomen is soft and nontender. Fluid and the drain was serosanguineous. I removed the drain    Assessment:     Diverticulitis with abscess     Plan:     She will finish her course of antibiotics. I will see her back in 2 weeks unless she develops fevers. I will then make a referral to a gastroenterologist for eventual colonoscopy.

## 2012-07-15 ENCOUNTER — Telehealth (INDEPENDENT_AMBULATORY_CARE_PROVIDER_SITE_OTHER): Payer: Self-pay | Admitting: *Deleted

## 2012-07-15 NOTE — Discharge Summary (Signed)
Physician Discharge Summary  Patient ID: Emma Henderson MRN: [id number] DOB/AGE: [birth year] 67 y.o.  Admit date: 06/19/2012 Discharge date: 06/23/2012  Admission Diagnoses: Perforated acute diverticulitis  Discharge Diagnoses: The same Active Problems:   * No active hospital problems. *   Discharged Condition: stable  Hospital Course: Patient presented to [facility] with lower dominant pain. Workup and evaluation was consistent for acute diverticulitis. CT evaluation the abdomen and pelvis demonstrated a small collection of free air around the sigmoid colon. This was contained. She was admitted and initiated on IV antibiotics and IV fluid hydration. Her symptomatology continue to improve as did her leukocytosis. Her physical exam findings were improving. Options were discussed and patient did wish to proceed with conservative management as tolerated. She continued to demonstrate good progression and plans were for discharge on oral antibiotics. She then advanced to a low-residue diet. She tolerated this well. She is able toward. Plans for discharge were discussed  Consults: None  Significant Diagnostic Studies: radiology: CT scan: Abdomen pelvis  Treatments: IV hydration and antibiotics: Zosyn  Discharge Exam: Blood pressure 146/89, pulse 66, temperature 98 F (36.7 C), temperature source Oral, resp. rate 20, height 5\' 3"  (1.6 m), weight 68.04 kg (150 lb), last menstrual period 01/21/1998, SpO2 98.00%. General appearance: alert and no distress Resp: clear to auscultation bilaterally Cardio: regular rate and rhythm GI: Positive bowel sounds, soft, mild expected suprapubic tenderness. No diffuse peritoneal signs.  Disposition: 01-Home or Self Care  Discharge Orders   Future Appointments Provider Department Dept Phone   07/27/2012 11:30 AM Shelly Rubenstein, MD [facility], [place] [phone]   Future Orders Complete By Expires     Call MD for:  persistant nausea and  vomiting  As directed     Call MD for:  severe uncontrolled pain  As directed     Call MD for:  temperature >100.4  As directed     Diet - low sodium heart healthy  As directed     Discharge instructions  As directed     Comments:      Increase activity as tolerated. May place ice pack for comfort.  Alternate an anti-inflammatory such as ibuprofen (Motrin, Advil) 400-600mg  every 6 hours with the prescribed pain medication.   Do not take any additional acetaminophen as there is Tylenol in the pain medication.    Driving Restrictions  As directed     Comments:      No driving while on pain medications.    Increase activity slowly  As directed         Medication List    STOP taking these medications       CALCARB 600/D PO      TAKE these medications       acetaminophen 500 MG tablet  Commonly known as:  TYLENOL  Take 500 mg by mouth every 6 (six) hours as needed for pain.         SignedFabio Bering 07/15/2012, 12:19 PM

## 2012-07-15 NOTE — Telephone Encounter (Signed)
[facility]ills called to state that patient no longer needs home health services so they are discharging her from their service.

## 2012-07-27 ENCOUNTER — Telehealth: Payer: Self-pay

## 2012-07-27 ENCOUNTER — Ambulatory Visit (INDEPENDENT_AMBULATORY_CARE_PROVIDER_SITE_OTHER): Payer: Medicare Other | Admitting: Surgery

## 2012-07-27 ENCOUNTER — Encounter (INDEPENDENT_AMBULATORY_CARE_PROVIDER_SITE_OTHER): Payer: Self-pay | Admitting: Surgery

## 2012-07-27 VITALS — BP 130/84 | HR 71 | Temp 97.5°F | Ht 63.0 in | Wt 139.0 lb

## 2012-07-27 DIAGNOSIS — K5780 Diverticulitis of intestine, part unspecified, with perforation and abscess without bleeding: Secondary | ICD-10-CM

## 2012-07-27 DIAGNOSIS — K5732 Diverticulitis of large intestine without perforation or abscess without bleeding: Secondary | ICD-10-CM

## 2012-07-27 NOTE — Progress Notes (Signed)
Subjective:     Patient ID: Emma Henderson, female   DOB: 1945/10/11, 67 y.o.   MRN: [id number]  HPI She is here for another followup visit. She has had no problems since her drain was removed. She has finished her antibiotics. She feels normal with no abdominal pain and no fevers  Review of Systems     Objective:   Physical Exam Her abdomen is complete benign on physical examination and nontender    Assessment:     Followup from perforated diverticulitis with abscess     Plan:     We will now refer her to Dr. Christella Hartigan for evaluation from a gastroenterologic standpoint. I will see her back in approximately 2 months

## 2012-07-27 NOTE — Telephone Encounter (Signed)
Message copied by Donata Duff on Mon Jul 27, 2012 12:55 PM ------      Message from: Rob Bunting P      Created: Mon Jul 27, 2012 12:07 PM       Gala Romney, thanks.  We'll get her in soon to talk about colonoscopy            Sulema Braid,      Can you get her appt with me, new gi in next 2-3 weeks.  If no spots available then book with extender and I'll be taking her care. Her daughter is a friend of mine and I know about her hosp stay last month already.  Thanks            dj                              ----- Message -----         From: Shelly Rubenstein, MD         Sent: 07/27/2012  11:52 AM           To: Rachael Fee, MD                   ------

## 2012-07-27 NOTE — Telephone Encounter (Signed)
Left message on machine to call back  

## 2012-07-28 ENCOUNTER — Telehealth (INDEPENDENT_AMBULATORY_CARE_PROVIDER_SITE_OTHER): Payer: Self-pay | Admitting: General Surgery

## 2012-07-28 NOTE — Telephone Encounter (Signed)
Patient is aware of appt with Dr Christella Hartigan  GI on 08/24/12 at 130pm

## 2012-07-28 NOTE — Telephone Encounter (Signed)
Pt has appt for 08/24/12 new pt paperwork has been mailed

## 2012-08-24 ENCOUNTER — Ambulatory Visit (INDEPENDENT_AMBULATORY_CARE_PROVIDER_SITE_OTHER): Payer: Medicare Other | Admitting: Gastroenterology

## 2012-08-24 ENCOUNTER — Encounter: Payer: Self-pay | Admitting: Gastroenterology

## 2012-08-24 VITALS — BP 124/68 | HR 84 | Ht 63.0 in | Wt 139.2 lb

## 2012-08-24 DIAGNOSIS — Z1211 Encounter for screening for malignant neoplasm of colon: Secondary | ICD-10-CM

## 2012-08-24 DIAGNOSIS — K5732 Diverticulitis of large intestine without perforation or abscess without bleeding: Secondary | ICD-10-CM

## 2012-08-24 MED ORDER — MOVIPREP 100 G PO SOLR: 1.0000 | Freq: Once | ORAL | Status: DC

## 2012-08-24 NOTE — Patient Instructions (Addendum)
You will be set up for a colonoscopy at [facility] (moderate sedation) for colon cancer screening, recent complicated diverticulitis (likely Friday August 8th).                                               We are excited to introduce MyChart, a new best-in-class service that provides you online access to important information in your electronic medical record. We want to make it easier for you to view your health information - all in one secure location - when and where you need it. We expect MyChart will enhance the quality of care and service we provide.  When you register for MyChart, you can:    View your test results.    Request appointments and receive appointment reminders via email.    Request medication renewals.    View your medical history, allergies, medications and immunizations.    Communicate with your physician's office through a password-protected site.    Conveniently print information such as your medication lists.  To find out if MyChart is right for you, please talk to a member of our clinical staff today. We will gladly answer your questions about this free health and wellness tool.  If you are age 67 or older and want a member of your family to have access to your record, you must provide written consent by completing a proxy form available at our office. Please speak to our clinical staff about guidelines regarding accounts for patients younger than age 89.  As you activate your MyChart account and need any technical assistance, please call the MyChart technical support line at [phone]) or email your question to [email or web address]. If you email your question(s), please include your name, a return phone number and the best time to reach you.  If you have non-urgent health-related questions, you can send a message to our office through MyChart at [email or web address]. If you have a medical emergency, call 911.  Thank you for using MyChart as  your new health and wellness resource!   MyChart licensed from [employer],  [id number]. Patents Pending.

## 2012-08-24 NOTE — Progress Notes (Signed)
HPI: This is a  very pleasant 67 year old woman whom I am meeting for the first time today.  Colonoscopy Dr. Karilyn Cota 08/2004; for routine risk screening: diverticulosis (left and right), no polyps. Was recommended to have repeat in 10 years.   She was hospitalized this past June with sigmoid diverticulitis, complicated by 5.9 cm pelvic abscess that was treated with drain placement and antibiotics.  The drain has been out at least a month now. NO signficant issues. But sometimes she can be tired, fatigued more than usual.  Not limiting her at all.  She's been on a low fiber diet.  No fresh fruit or veggies.  Her bowels move normally.  No constipation, no bleeding.  She is going to be scheduled for elective sigmoid colectomy and is here today to discuss preoperative colonoscopy.  She is the mother of a friend of mine whom I discussed her acute illness with several times the past 1-2 months.  Not scheduled for surgery yet.  For several years she's been told she has a bladder infection at annual physical.  No flecks of stool.   Review of systems: Pertinent positive and negative review of systems were noted in the above HPI section. Complete review of systems was performed and was otherwise normal.    Past Medical History  Diagnosis Date  . Diverticulosis 2006  . Osteopenia 01/2011    t-score -1.3, FRAX 8.6 % & 0.8%  . Perforated diverticulitis 07/04/2012  . Osteopenia 07/04/2012  . Umbilical hernia 07/04/2012  . Arthritis     Past Surgical History  Procedure Laterality Date  . Cervical spine surgery    . Lumbar spine surgery      Current Outpatient Prescriptions  Medication Sig Dispense Refill  . acetaminophen (TYLENOL) 500 MG tablet Take 500 mg by mouth every 6 (six) hours as needed for pain.       No current facility-administered medications for this visit.    Allergies as of 08/24/2012  . (No Known Allergies)    Family History  Problem Relation Age of Onset  . Heart  disease Mother     heart attacks died with second one  . Stroke Father     died of a stroke  . Heart disease Father     had two MI's  . Other Father     nonmalignant brain tumor  . Heart disease Brother     History   Social History  . Marital Status: Widowed    Spouse Name: N/A    Number of Children: 2  . Years of Education: N/A   Occupational History  . teacher    Social History Main Topics  . Smoking status: Former Smoker    Types: Cigarettes    Quit date: 01/21/1970  . Smokeless tobacco: Never Used     Comment: stopped at age 29  . Alcohol Use: 3.5 oz/week    7 drink(s) per week  . Drug Use: No  . Sexually Active: No   Other Topics Concern  . Not on file   Social History Narrative  . No narrative on file       Physical Exam: BP 124/68  Pulse 84  Ht 5\' 3"  (1.6 m)  Wt 139 lb 4 oz (63.163 kg)  BMI 24.67 kg/m2  LMP 01/21/1998 Constitutional: generally well-appearing Psychiatric: alert and oriented x3 Eyes: extraocular movements intact Mouth: oral pharynx moist, no lesions Neck: supple no lymphadenopathy Cardiovascular: heart regular rate and rhythm Lungs: clear to auscultation bilaterally Abdomen:  soft, nontender, nondistended, no obvious ascites, no peritoneal signs, normal bowel sounds Extremities: no lower extremity edema bilaterally Skin: no lesions on visible extremities    Assessment and plan: 67 y.o. female with  recent complicated sigmoid diverticulitis with perforation, abscess formation  Clinically she is doing very well now. She has no real abdominal pains, she is nontender on examination. The drain has been out for at least a month. I think it is safe to proceed with colonoscopy at this point. Her last colonoscopy was about 8 years ago, no polyps were found. We discussed risks, alternatives, benefits to the procedure and she decided to go ahead with that. She would like to try to do this soon so that she can go ahead and schedule elective  sigmoid resection with Dr. Marta Antu and she is hoping to have that done well with for a family wedding in mid October. We will try to expedite as best as possible.

## 2012-08-25 ENCOUNTER — Encounter: Payer: Self-pay | Admitting: Gastroenterology

## 2012-08-26 ENCOUNTER — Other Ambulatory Visit: Payer: Self-pay

## 2012-08-28 ENCOUNTER — Encounter: Payer: Self-pay | Admitting: Gastroenterology

## 2012-08-28 ENCOUNTER — Ambulatory Visit (AMBULATORY_SURGERY_CENTER): Payer: Medicare Other | Admitting: Gastroenterology

## 2012-08-28 ENCOUNTER — Telehealth: Payer: Self-pay

## 2012-08-28 VITALS — BP 140/84 | HR 68 | Temp 96.0°F | Resp 17 | Ht 63.0 in | Wt 138.0 lb

## 2012-08-28 DIAGNOSIS — K5730 Diverticulosis of large intestine without perforation or abscess without bleeding: Secondary | ICD-10-CM

## 2012-08-28 DIAGNOSIS — Z1211 Encounter for screening for malignant neoplasm of colon: Secondary | ICD-10-CM

## 2012-08-28 MED ORDER — SODIUM CHLORIDE 0.9 % IV SOLN: 500.0000 mL | INTRAVENOUS | Status: DC

## 2012-08-28 NOTE — Telephone Encounter (Signed)
Left message to call [facility] following procedure this morning if problems or concerns

## 2012-08-28 NOTE — Op Note (Signed)
[facility] [address] [place], [address]   COLONOSCOPY PROCEDURE REPORT  PATIENT: Emma Henderson, Emma Henderson  MR#: [id number] BIRTHDATE: February 16, 1945 , 67  yrs. old GENDER: Female ENDOSCOPIST: Rachael Fee, MD REFERRED XB:[id number] Magnus Ivan, M.D. PROCEDURE DATE:  08/28/2012 PROCEDURE:   Colonoscopy, diagnostic ASA CLASS:   Class II INDICATIONS:recent severe diverticulitis.  colonoscopy Dr.  Samuel Jester 2006 found no polyps. MEDICATIONS: Fentanyl 100 mcg IV, Versed 10 mg IV, and These medications were titrated to patient response per physician's verbal order  DESCRIPTION OF PROCEDURE:   After the risks benefits and alternatives of the procedure were thoroughly explained, informed consent was obtained.  A digital rectal exam revealed no abnormalities of the rectum.   The LB [id number] and LB [id number]  endoscope was introduced through the anus and advanced to the cecum, which was identified by both the appendix and ileocecal valve. No adverse events experienced.   The quality of the prep was good.  The instrument was then slowly withdrawn as the colon was fully examined.   COLON FINDINGS: There were multiple large diverticulum in left colon with associated significant luminal tortuousity, mild edema.  The examination was otherwise normal.  Retroflexed views revealed no abnormalities. The time to cecum=7 minutes 23 seconds.  Withdrawal time=5 minutes 29 seconds.  The scope was withdrawn and the procedure completed. COMPLICATIONS: There were no complications.  ENDOSCOPIC IMPRESSION: There were multiple large diverticulum in left colon with associated significant luminal tortuousity, mild edema. No polyps or cancers  RECOMMENDATIONS: You should continue to follow colorectal cancer screening guidelines for "routine risk" patients with a repeat colonoscopy in 10 years. There is no need for FOBT (stool) testing for at least 5 years. OK to proceed with elective  segmental colon resection, timing per Dr. Magnus Ivan  eSigned:  Rachael Fee, MD 08/28/2012 8:32 AM   cc: Carylon Perches, MD

## 2012-08-28 NOTE — Progress Notes (Signed)
While being transported to car, transporter said Emma Henderson experienced episode of cramps again, but refused to come back to recovery.

## 2012-08-28 NOTE — Progress Notes (Signed)
All medicines titrated per md. ewm

## 2012-08-28 NOTE — Patient Instructions (Addendum)
YOU HAD AN ENDOSCOPIC PROCEDURE TODAY AT THE [facility]: Refer to the procedure report that was given to you for any specific questions about what was found during the examination.  If the procedure report does not answer your questions, please call your gastroenterologist to clarify.  If you requested that your care partner not be given the details of your procedure findings, then the procedure report has been included in a sealed envelope for you to review at your convenience later.  YOU SHOULD EXPECT: Some feelings of bloating in the abdomen. Passage of more gas than usual.  Walking can help get rid of the air that was put into your GI tract during the procedure and reduce the bloating. If you had a lower endoscopy (such as a colonoscopy or flexible sigmoidoscopy) you may notice spotting of blood in your stool or on the toilet paper. If you underwent a bowel prep for your procedure, then you may not have a normal bowel movement for a few days.  DIET: Your first meal following the procedure should be a light meal and then it is ok to progress to your normal diet.  A half-sandwich or bowl of soup is an example of a good first meal.  Heavy or fried foods are harder to digest and may make you feel nauseous or bloated.  Likewise meals heavy in dairy and vegetables can cause extra gas to form and this can also increase the bloating.  Drink plenty of fluids but you should avoid alcoholic beverages for 24 hours.  ACTIVITY: Your care partner should take you home directly after the procedure.  You should plan to take it easy, moving slowly for the rest of the day.  You can resume normal activity the day after the procedure however you should NOT DRIVE or use heavy machinery for 24 hours (because of the sedation medicines used during the test).    SYMPTOMS TO REPORT IMMEDIATELY: A gastroenterologist can be reached at any hour.  During normal business hours, 8:30 AM to 5:00 PM Monday through Friday,  call [phone].  After hours and on weekends, please call the GI answering service at [phone] who will take a message and have the physician on call contact you.   Following lower endoscopy (colonoscopy or flexible sigmoidoscopy):  Excessive amounts of blood in the stool  Significant tenderness or worsening of abdominal pains  Swelling of the abdomen that is new, acute  Fever of 100F or higher  FOLLOW UP: If any biopsies were taken you will be contacted by phone or by letter within the next 1-3 weeks.  Call your gastroenterologist if you have not heard about the biopsies in 3 weeks.  Our staff will call the home number listed on your records the next business day following your procedure to check on you and address any questions or concerns that you may have at that time regarding the information given to you following your procedure. This is a courtesy call and so if there is no answer at the home number and we have not heard from you through the emergency physician on call, we will assume that you have returned to your regular daily activities without incident.  SIGNATURES/CONFIDENTIALITY: You and/or your care partner have signed paperwork which will be entered into your electronic medical record.  These signatures attest to the fact that that the information above on your After Visit Summary has been reviewed and is understood.  Full responsibility of the confidentiality of this   discharge information lies with you and/or your care-partner.  Recommendations See procedure report  

## 2012-08-28 NOTE — Progress Notes (Signed)
Patient did not experience any of the following events: a burn prior to discharge; a fall within the facility; wrong site/side/patient/procedure/implant event; or a hospital transfer or hospital admission upon discharge from the facility. (G8907)  

## 2012-08-31 ENCOUNTER — Telehealth: Payer: Self-pay | Admitting: *Deleted

## 2012-08-31 NOTE — Telephone Encounter (Signed)
Error pt already called for f/u

## 2012-08-31 NOTE — Telephone Encounter (Signed)
  Follow up Call-  Call back number 08/28/2012  Post procedure Call Back phone  # [phone]  Permission to leave phone message Yes     Patient questions:  Do you have a fever, pain , or abdominal swelling? no Pain Score  0 *  Have you tolerated food without any problems? yes  Have you been able to return to your normal activities? yes  Do you have any questions about your discharge instructions: Diet   no Medications  no Follow up visit  no  Do you have questions or concerns about your Care? no  Actions: * If pain score is 4 or above: No action needed, pain <4.

## 2012-09-14 ENCOUNTER — Ambulatory Visit (INDEPENDENT_AMBULATORY_CARE_PROVIDER_SITE_OTHER): Payer: Medicare Other | Admitting: Surgery

## 2012-09-14 ENCOUNTER — Encounter (INDEPENDENT_AMBULATORY_CARE_PROVIDER_SITE_OTHER): Payer: Self-pay | Admitting: Surgery

## 2012-09-14 VITALS — BP 142/90 | HR 72 | Temp 98.4°F | Resp 14 | Ht 63.0 in | Wt 139.0 lb

## 2012-09-14 DIAGNOSIS — Z8719 Personal history of other diseases of the digestive system: Secondary | ICD-10-CM

## 2012-09-14 NOTE — Progress Notes (Signed)
Subjective:     Patient ID: Emma Henderson, female   DOB: 1945/11/14, 67 y.o.   MRN: [id number]  HPI She is here for followup of diverticulitis with abscess. She has now had a colonoscopy confirming diverticuli and mild inflammation. She is currently doing well and is asymptomatic  Review of Systems     Objective:   Physical Exam Her abdomen is soft and nontender on exam  I reviewed the colonoscopy report    Assessment:     History of diverticulitis with abscess     Plan:     I would recommend a left upper assisted partial colectomy given the fact that she had a large abscess in the diverticuli are quite large over recent colonoscopy. She is in agreement. Surgery will be scheduled

## 2012-09-25 ENCOUNTER — Encounter (INDEPENDENT_AMBULATORY_CARE_PROVIDER_SITE_OTHER): Payer: Medicare Other | Admitting: Surgery

## 2012-10-05 ENCOUNTER — Encounter (INDEPENDENT_AMBULATORY_CARE_PROVIDER_SITE_OTHER): Payer: Medicare Other | Admitting: Surgery

## 2012-10-21 ENCOUNTER — Encounter (HOSPITAL_COMMUNITY): Payer: Self-pay | Admitting: Pharmacy Technician

## 2012-10-28 NOTE — Pre-Procedure Instructions (Signed)
Emma Henderson  10/28/2012   Your procedure is scheduled on:  Wednesday, October 15th  Report to Main Entrance "A" at 0800 AM.  Call this number if you have problems the morning of surgery: [phone]   Remember:   Do not eat food or drink liquids after midnight.   Take these medicines the morning of surgery with A SIP OF WATER: tylenol if needed   Do not wear jewelry, make-up or nail polish.  Do not wear lotions, powders, or perfumes. You may wear deodorant.  Do not shave 48 hours prior to surgery. Men may shave face and neck.  Do not bring valuables to the hospital.  [facility] is not responsible  for any belongings or valuables.               Contacts, dentures or bridgework may not be worn into surgery.  Leave suitcase in the car. After surgery it may be brought to your room.  For patients admitted to the hospital, discharge time is determined by your treatment team.               Patients discharged the day of surgery will not be allowed to drive home.    Special Instructions: Shower using CHG 2 nights before surgery and the night before surgery.  If you shower the day of surgery use CHG.  Use special wash - you have one bottle of CHG for all showers.  You should use approximately 1/3 of the bottle for each shower.   Please read over the following fact sheets that you were given: Pain Booklet, Coughing and Deep Breathing and Surgical Site Infection Prevention

## 2012-10-29 ENCOUNTER — Other Ambulatory Visit (HOSPITAL_COMMUNITY): Payer: Medicare Other

## 2012-10-29 ENCOUNTER — Encounter (HOSPITAL_COMMUNITY)
Admission: RE | Admit: 2012-10-29 | Discharge: 2012-10-29 | Disposition: A | Discharge status: Home / Self Care | Payer: Medicare Other | Source: Ambulatory Visit | Attending: Surgery | Admitting: Surgery

## 2012-10-29 ENCOUNTER — Encounter (HOSPITAL_COMMUNITY): Payer: Self-pay

## 2012-10-29 DIAGNOSIS — Z01812 Encounter for preprocedural laboratory examination: Secondary | ICD-10-CM | POA: Insufficient documentation

## 2012-10-29 DIAGNOSIS — Z01818 Encounter for other preprocedural examination: Secondary | ICD-10-CM | POA: Insufficient documentation

## 2012-10-29 HISTORY — DX: Unsteadiness on feet: R26.81

## 2012-10-29 HISTORY — DX: Headache: R51

## 2012-10-29 LAB — CBC
HCT: 40.5 % (ref 36.0–46.0)
MCHC: 33.3 g/dL (ref 30.0–36.0)
Platelets: 304 10*3/uL (ref 150–400)
RDW: 14.4 % (ref 11.5–15.5)

## 2012-10-29 LAB — BASIC METABOLIC PANEL
BUN: 19 mg/dL (ref 6–23)
GFR calc Af Amer: 82 mL/min — ABNORMAL LOW (ref >90)
GFR calc non Af Amer: 70 mL/min — ABNORMAL LOW (ref >90)
Potassium: 4.1 mEq/L (ref 3.5–5.1)
Sodium: 142 mEq/L (ref 135–145)

## 2012-10-29 LAB — ABO/RH: ABO/RH(D): O POS

## 2012-10-29 LAB — TYPE AND SCREEN

## 2012-10-29 NOTE — Progress Notes (Signed)
Primary physician - dr. Ouida Sills Does not have a cardiologist ekg approximately 1 year ago. No other cardiac testing

## 2012-11-03 MED ORDER — METRONIDAZOLE IN NACL 5-0.79 MG/ML-% IV SOLN
500.0000 mg | INTRAVENOUS | Status: AC
  Administered 2012-11-04: .5 g via INTRAVENOUS
  Filled 2012-11-03 (×2): qty 100

## 2012-11-03 MED ORDER — CEFAZOLIN SODIUM-DEXTROSE 2-3 GM-% IV SOLR
2.0000 g | INTRAVENOUS | Status: AC
  Administered 2012-11-04: 2 g via INTRAVENOUS
  Filled 2012-11-03: qty 50

## 2012-11-03 MED ORDER — ALVIMOPAN 12 MG PO CAPS
12.0000 mg | ORAL_CAPSULE | Freq: Once | ORAL | Status: AC
  Administered 2012-11-04: 12 mg via ORAL
  Filled 2012-11-03: qty 1

## 2012-11-03 NOTE — H&P (Signed)
Emma Henderson is an 67 y.o. female.   Chief Complaint: History of diverticular abscess HPI: This is a pleasant female who presented earlier this year with a significant diverticular abscess. She responded well to percutaneous drainage by interventional radiology. She has since had a colonoscopy which other than diverticular disease, was unremarkable. She has been doing well. She is now being admitted for elective laparoscopic-assisted partial colectomy. She has no complaints  Past Medical History  Diagnosis Date  . Diverticulosis 2006  . Osteopenia 01/2011    t-score -1.3, FRAX 8.6 % & 0.8%  . Perforated diverticulitis 07/04/2012  . Osteopenia 07/04/2012  . Umbilical hernia 07/04/2012  . Arthritis   . Cataract   . Headache(784.0)   . Unsteady gait     Past Surgical History  Procedure Laterality Date  . Cervical spine surgery    . Lumbar spine surgery      Family History  Problem Relation Age of Onset  . Heart disease Mother     heart attacks died with second one  . Stroke Father     died of a stroke  . Heart disease Father     had two MI's  . Other Father     nonmalignant brain tumor  . Heart disease Brother    Social History:  reports that she quit smoking about 42 years ago. Her smoking use included Cigarettes. She smoked 0.00 packs per day. She has never used smokeless tobacco. She reports that she drinks about 1.2 ounces of alcohol per week. She reports that she does not use illicit drugs.  Allergies: No Known Allergies  No prescriptions prior to admission    No results found for this or any previous visit (from the past 48 hour(s)). No results found.  Review of Systems  All other systems reviewed and are negative.    Last menstrual period 01/21/1998. Physical Exam  Constitutional: She is oriented to person, place, and time. She appears well-developed and well-nourished. No distress.  HENT:  Head: Normocephalic and atraumatic.  Right Ear: External ear normal.   Left Ear: External ear normal.  Nose: Nose normal.  Mouth/Throat: Oropharynx is clear and moist. No oropharyngeal exudate.  Eyes: Conjunctivae are normal. Right eye exhibits no discharge. Left eye exhibits no discharge.  Neck: Normal range of motion. Neck supple.  Cardiovascular: Normal rate, regular rhythm, normal heart sounds and intact distal pulses.   No murmur heard. Respiratory: Effort normal and breath sounds normal. No respiratory distress. She has no wheezes.  GI: Soft. Bowel sounds are normal. She exhibits no distension. There is no tenderness. There is no rebound.  Musculoskeletal: Normal range of motion.  Lymphadenopathy:    She has no cervical adenopathy.  Neurological: She is alert and oriented to person, place, and time.  Skin: Skin is warm and dry.  Psychiatric: Her behavior is normal. Judgment normal.     Assessment/Plan History of diverticular abscess  Because of the significance of this abscess and a large diverticuli seen on endoscopy, laparoscopic assisted partial colectomy has been recommended. I discussed this with her in detail. We discussed the risks which includes but is not limited to bleeding, infection, anastomotic leak, anastomotic breakdown with need for other surgery, injury to surround structures, et Karie Soda. We discussed postoperative recovery as well. Preoperative bowel preparation will be given. Surgery is scheduled  Kaaren Nass A 11/03/2012, 9:21 AM

## 2012-11-04 ENCOUNTER — Inpatient Hospital Stay (HOSPITAL_COMMUNITY): Payer: Medicare Other | Admitting: Anesthesiology

## 2012-11-04 ENCOUNTER — Inpatient Hospital Stay (HOSPITAL_COMMUNITY)
Admission: RE | Admit: 2012-11-04 | Discharge: 2012-11-08 | DRG: 331 | Disposition: A | Discharge status: Home / Self Care | Payer: Medicare Other | Source: Ambulatory Visit | Attending: Surgery | Admitting: Surgery

## 2012-11-04 ENCOUNTER — Encounter (HOSPITAL_COMMUNITY): Payer: Medicare Other | Admitting: Anesthesiology

## 2012-11-04 ENCOUNTER — Encounter (HOSPITAL_COMMUNITY)
Admission: RE | Disposition: A | Discharge status: Home / Self Care | Payer: Self-pay | Source: Ambulatory Visit | Attending: Surgery

## 2012-11-04 ENCOUNTER — Encounter (HOSPITAL_COMMUNITY): Payer: Self-pay | Admitting: *Deleted

## 2012-11-04 DIAGNOSIS — M129 Arthropathy, unspecified: Secondary | ICD-10-CM | POA: Diagnosis present

## 2012-11-04 DIAGNOSIS — M899 Disorder of bone, unspecified: Secondary | ICD-10-CM | POA: Diagnosis present

## 2012-11-04 DIAGNOSIS — K5730 Diverticulosis of large intestine without perforation or abscess without bleeding: Principal | ICD-10-CM | POA: Diagnosis present

## 2012-11-04 HISTORY — PX: LAPAROSCOPIC PARTIAL COLECTOMY: SHX5907

## 2012-11-04 HISTORY — PX: HERNIA REPAIR: SHX51

## 2012-11-04 HISTORY — PX: LAPAROSCOPIC SIGMOID COLECTOMY: SHX5928

## 2012-11-04 HISTORY — PX: PROCTOPLASTY: SHX6225

## 2012-11-04 SURGERY — LAPAROSCOPIC PARTIAL COLECTOMY
Anesthesia: General | Site: Rectum | Wound class: Clean Contaminated

## 2012-11-04 MED ORDER — KETOROLAC TROMETHAMINE 30 MG/ML IJ SOLN
15.0000 mg | Freq: Once | INTRAMUSCULAR | Status: AC | PRN
  Administered 2012-11-04: 30 mg via INTRAVENOUS

## 2012-11-04 MED ORDER — 0.9 % SODIUM CHLORIDE (POUR BTL) OPTIME
TOPICAL | Status: DC | PRN
  Administered 2012-11-04 (×4): 1000 mL

## 2012-11-04 MED ORDER — BUPIVACAINE-EPINEPHRINE 0.25% -1:200000 IJ SOLN
INTRAMUSCULAR | Status: DC | PRN
  Administered 2012-11-04: 20 mL

## 2012-11-04 MED ORDER — SODIUM CHLORIDE 0.9 % IJ SOLN: 9.0000 mL | INTRAMUSCULAR | Status: DC | PRN

## 2012-11-04 MED ORDER — ONDANSETRON HCL 4 MG/2ML IJ SOLN
INTRAMUSCULAR | Status: DC | PRN
  Administered 2012-11-04: 4 mg via INTRAMUSCULAR

## 2012-11-04 MED ORDER — DIPHENHYDRAMINE HCL 50 MG/ML IJ SOLN: 12.5000 mg | Freq: Four times a day (QID) | INTRAMUSCULAR | Status: DC | PRN

## 2012-11-04 MED ORDER — PROPOFOL 10 MG/ML IV BOLUS
INTRAVENOUS | Status: DC | PRN
  Administered 2012-11-04: 140 mg via INTRAVENOUS

## 2012-11-04 MED ORDER — SUFENTANIL CITRATE 50 MCG/ML IV SOLN
INTRAVENOUS | Status: DC | PRN
  Administered 2012-11-04: 10 ug via INTRAVENOUS
  Administered 2012-11-04: 20 ug via INTRAVENOUS
  Administered 2012-11-04 (×2): 10 ug via INTRAVENOUS

## 2012-11-04 MED ORDER — POTASSIUM CHLORIDE IN NACL 20-0.9 MEQ/L-% IV SOLN
INTRAVENOUS | Status: DC
  Administered 2012-11-04 – 2012-11-06 (×5): via INTRAVENOUS
  Filled 2012-11-04 (×9): qty 1000

## 2012-11-04 MED ORDER — ONDANSETRON HCL 4 MG/2ML IJ SOLN: 4.0000 mg | Freq: Once | INTRAMUSCULAR | Status: DC | PRN

## 2012-11-04 MED ORDER — ONDANSETRON HCL 4 MG PO TABS: 4.0000 mg | ORAL_TABLET | Freq: Four times a day (QID) | ORAL | Status: DC | PRN

## 2012-11-04 MED ORDER — HYDROMORPHONE HCL PF 1 MG/ML IJ SOLN
0.2500 mg | INTRAMUSCULAR | Status: DC | PRN
  Administered 2012-11-04 (×2): 0.5 mg via INTRAVENOUS

## 2012-11-04 MED ORDER — VECURONIUM BROMIDE 10 MG IV SOLR
INTRAVENOUS | Status: DC | PRN
  Administered 2012-11-04: 2 mg via INTRAVENOUS
  Administered 2012-11-04: 1 mg via INTRAVENOUS

## 2012-11-04 MED ORDER — MORPHINE SULFATE (PF) 1 MG/ML IV SOLN
INTRAVENOUS | Status: AC
  Filled 2012-11-04: qty 25

## 2012-11-04 MED ORDER — KETOROLAC TROMETHAMINE 30 MG/ML IJ SOLN
30.0000 mg | Freq: Once | INTRAMUSCULAR | Status: AC
  Administered 2012-11-04: 30 mg via INTRAVENOUS
  Filled 2012-11-04: qty 1

## 2012-11-04 MED ORDER — NALOXONE HCL 0.4 MG/ML IJ SOLN: 0.4000 mg | INTRAMUSCULAR | Status: DC | PRN

## 2012-11-04 MED ORDER — HYDROMORPHONE HCL PF 1 MG/ML IJ SOLN
INTRAMUSCULAR | Status: AC
  Administered 2012-11-04: 0.5 mg via INTRAVENOUS
  Filled 2012-11-04: qty 1

## 2012-11-04 MED ORDER — ENOXAPARIN SODIUM 40 MG/0.4ML ~~LOC~~ SOLN
40.0000 mg | SUBCUTANEOUS | Status: DC
  Administered 2012-11-05 – 2012-11-06 (×2): 40 mg via SUBCUTANEOUS
  Filled 2012-11-04 (×4): qty 0.4

## 2012-11-04 MED ORDER — KETOROLAC TROMETHAMINE 30 MG/ML IJ SOLN
INTRAMUSCULAR | Status: AC
  Administered 2012-11-04: 30 mg via INTRAVENOUS
  Filled 2012-11-04: qty 1

## 2012-11-04 MED ORDER — LACTATED RINGERS IV SOLN
INTRAVENOUS | Status: DC
  Administered 2012-11-04 (×2): via INTRAVENOUS

## 2012-11-04 MED ORDER — CHLORHEXIDINE GLUCONATE 0.12 % MT SOLN
15.0000 mL | Freq: Two times a day (BID) | OROMUCOSAL | Status: DC
  Administered 2012-11-05 – 2012-11-07 (×5): 15 mL via OROMUCOSAL
  Filled 2012-11-04 (×5): qty 15

## 2012-11-04 MED ORDER — BIOTENE DRY MOUTH MT LIQD
15.0000 mL | Freq: Two times a day (BID) | OROMUCOSAL | Status: DC
  Administered 2012-11-05 – 2012-11-06 (×3): 15 mL via OROMUCOSAL

## 2012-11-04 MED ORDER — DIPHENHYDRAMINE HCL 12.5 MG/5ML PO ELIX: 12.5000 mg | ORAL_SOLUTION | Freq: Four times a day (QID) | ORAL | Status: DC | PRN

## 2012-11-04 MED ORDER — ALVIMOPAN 12 MG PO CAPS
12.0000 mg | ORAL_CAPSULE | Freq: Two times a day (BID) | ORAL | Status: DC
  Administered 2012-11-05 – 2012-11-06 (×3): 12 mg via ORAL
  Filled 2012-11-04 (×5): qty 1

## 2012-11-04 MED ORDER — LIDOCAINE HCL (CARDIAC) 20 MG/ML IV SOLN
INTRAVENOUS | Status: DC | PRN
  Administered 2012-11-04: 30 mg via INTRAVENOUS

## 2012-11-04 MED ORDER — ONDANSETRON HCL 4 MG/2ML IJ SOLN: 4.0000 mg | Freq: Four times a day (QID) | INTRAMUSCULAR | Status: DC | PRN

## 2012-11-04 MED ORDER — MORPHINE SULFATE (PF) 1 MG/ML IV SOLN
INTRAVENOUS | Status: DC
  Administered 2012-11-04: 14:00:00 via INTRAVENOUS
  Administered 2012-11-04: 1.5 mg via INTRAVENOUS
  Administered 2012-11-05: 1 mg via INTRAVENOUS
  Administered 2012-11-05: 1.5 mg via INTRAVENOUS

## 2012-11-04 MED ORDER — NEOSTIGMINE METHYLSULFATE 1 MG/ML IJ SOLN
INTRAMUSCULAR | Status: DC | PRN
  Administered 2012-11-04: 3 mg via INTRAVENOUS

## 2012-11-04 MED ORDER — MIDAZOLAM HCL 5 MG/5ML IJ SOLN
INTRAMUSCULAR | Status: DC | PRN
  Administered 2012-11-04: 2 mg via INTRAVENOUS

## 2012-11-04 MED ORDER — GLYCOPYRROLATE 0.2 MG/ML IJ SOLN
INTRAMUSCULAR | Status: DC | PRN
  Administered 2012-11-04: .5 mg via INTRAVENOUS

## 2012-11-04 MED ORDER — ONDANSETRON HCL 4 MG/2ML IJ SOLN
4.0000 mg | Freq: Four times a day (QID) | INTRAMUSCULAR | Status: DC | PRN
  Administered 2012-11-04: 4 mg via INTRAVENOUS
  Filled 2012-11-04: qty 2

## 2012-11-04 MED ORDER — ROCURONIUM BROMIDE 100 MG/10ML IV SOLN
INTRAVENOUS | Status: DC | PRN
  Administered 2012-11-04: 45 mg via INTRAVENOUS
  Administered 2012-11-04: 5 mg via INTRAVENOUS

## 2012-11-04 SURGICAL SUPPLY — 82 items
APPLIER CLIP ROT 10 11.4 M/L (STAPLE)
APR CLP MED LRG 11.4X10 (STAPLE)
BLADE SURG 10 STRL SS (BLADE) ×3 IMPLANT
BLADE SURG ROTATE 9660 (MISCELLANEOUS) IMPLANT
CANISTER SUCTION 2500CC (MISCELLANEOUS) ×3 IMPLANT
CATH FOLEY 2WAY SLVR  5CC 14FR (CATHETERS) ×1
CATH FOLEY 2WAY SLVR 5CC 14FR (CATHETERS) IMPLANT
CELLS DAT CNTRL 66122 CELL SVR (MISCELLANEOUS) ×2 IMPLANT
CHLORAPREP W/TINT 26ML (MISCELLANEOUS) ×3 IMPLANT
CLIP APPLIE ROT 10 11.4 M/L (STAPLE) IMPLANT
COVER MAYO STAND STRL (DRAPES) ×6 IMPLANT
COVER SURGICAL LIGHT HANDLE (MISCELLANEOUS) ×3 IMPLANT
DRAPE PROXIMA HALF (DRAPES) ×6 IMPLANT
DRAPE UTILITY 15X26 W/TAPE STR (DRAPE) ×15 IMPLANT
DRAPE WARM FLUID 44X44 (DRAPE) ×3 IMPLANT
DRSG OPSITE POSTOP 4X10 (GAUZE/BANDAGES/DRESSINGS) IMPLANT
DRSG OPSITE POSTOP 4X6 (GAUZE/BANDAGES/DRESSINGS) ×1 IMPLANT
DRSG OPSITE POSTOP 4X8 (GAUZE/BANDAGES/DRESSINGS) IMPLANT
DRSG TEGADERM 2-3/8X2-3/4 SM (GAUZE/BANDAGES/DRESSINGS) ×4 IMPLANT
ELECT BLADE 6.5 EXT (BLADE) ×3 IMPLANT
ELECT CAUTERY BLADE 6.4 (BLADE) ×6 IMPLANT
ELECT REM PT RETURN 9FT ADLT (ELECTROSURGICAL) ×3
ELECTRODE REM PT RTRN 9FT ADLT (ELECTROSURGICAL) ×2 IMPLANT
GAUZE SPONGE 2X2 8PLY STRL LF (GAUZE/BANDAGES/DRESSINGS) IMPLANT
GEL ULTRASOUND 20GR AQUASONIC (MISCELLANEOUS) IMPLANT
GLOVE BIO SURGEON STRL SZ 6.5 (GLOVE) ×2 IMPLANT
GLOVE BIO SURGEON STRL SZ7.5 (GLOVE) ×3 IMPLANT
GLOVE BIOGEL PI IND STRL 7.0 (GLOVE) IMPLANT
GLOVE BIOGEL PI IND STRL 7.5 (GLOVE) IMPLANT
GLOVE BIOGEL PI INDICATOR 7.0 (GLOVE) ×5
GLOVE BIOGEL PI INDICATOR 7.5 (GLOVE) ×3
GLOVE SS BIOGEL STRL SZ 6.5 (GLOVE) IMPLANT
GLOVE SUPERSENSE BIOGEL SZ 6.5 (GLOVE) ×1
GLOVE SURG SIGNA 7.5 PF LTX (GLOVE) ×6 IMPLANT
GLOVE SURG SS PI 7.0 STRL IVOR (GLOVE) ×2 IMPLANT
GOWN PREVENTION PLUS XXLARGE (GOWN DISPOSABLE) ×2 IMPLANT
GOWN STRL NON-REIN LRG LVL3 (GOWN DISPOSABLE) ×13 IMPLANT
GOWN STRL REIN XL XLG (GOWN DISPOSABLE) ×6 IMPLANT
KIT BASIN OR (CUSTOM PROCEDURE TRAY) ×3 IMPLANT
LEGGING LITHOTOMY PAIR STRL (DRAPES) ×1 IMPLANT
LIGASURE IMPACT 36 18CM CVD LR (INSTRUMENTS) ×1 IMPLANT
NS IRRIG 1000ML POUR BTL (IV SOLUTION) ×6 IMPLANT
PAD ARMBOARD 7.5X6 YLW CONV (MISCELLANEOUS) ×6 IMPLANT
PENCIL BUTTON HOLSTER BLD 10FT (ELECTRODE) ×6 IMPLANT
RETRACTOR WND ALEXIS 18 MED (MISCELLANEOUS) IMPLANT
RTRCTR WOUND ALEXIS 18CM MED (MISCELLANEOUS) ×3
SCALPEL HARMONIC ACE (MISCELLANEOUS) ×3 IMPLANT
SCISSORS LAP 5X35 DISP (ENDOMECHANICALS) IMPLANT
SET IRRIG TUBING LAPAROSCOPIC (IRRIGATION / IRRIGATOR) IMPLANT
SLEEVE ENDOPATH XCEL 5M (ENDOMECHANICALS) ×4 IMPLANT
SPECIMEN JAR LARGE (MISCELLANEOUS) ×3 IMPLANT
SPONGE GAUZE 2X2 STER 10/PKG (GAUZE/BANDAGES/DRESSINGS) ×1
STAPLER CIRC CVD 29MM 37CM (STAPLE) ×1 IMPLANT
STAPLER CUT CVD 40MM BLUE (STAPLE) ×1 IMPLANT
STAPLER VISISTAT 35W (STAPLE) ×3 IMPLANT
SUCTION POOLE TIP (SUCTIONS) ×3 IMPLANT
SURGILUBE 2OZ TUBE FLIPTOP (MISCELLANEOUS) ×1 IMPLANT
SUT NOVAFIL 0 T 12 (SUTURE) ×1 IMPLANT
SUT PDS AB 1 TP1 96 (SUTURE) ×6 IMPLANT
SUT PROLENE 2 0 CT2 30 (SUTURE) ×1 IMPLANT
SUT PROLENE 2 0 KS (SUTURE) ×1 IMPLANT
SUT SILK 2 0 SH CR/8 (SUTURE) ×3 IMPLANT
SUT SILK 2 0 TIES 10X30 (SUTURE) ×3 IMPLANT
SUT SILK 3 0 SH CR/8 (SUTURE) ×3 IMPLANT
SUT SILK 3 0 TIES 10X30 (SUTURE) ×3 IMPLANT
SUT VIC AB 3-0 SH 27 (SUTURE) ×3
SUT VIC AB 3-0 SH 27XBRD (SUTURE) IMPLANT
SYR BULB IRRIGATION 50ML (SYRINGE) ×3 IMPLANT
SYS LAPSCP GELPORT 120MM (MISCELLANEOUS)
SYSTEM LAPSCP GELPORT 120MM (MISCELLANEOUS) IMPLANT
TOWEL OR 17X26 10 PK STRL BLUE (TOWEL DISPOSABLE) ×6 IMPLANT
TRAY FOLEY CATH 14FRSI W/METER (CATHETERS) ×3 IMPLANT
TRAY LAPAROSCOPIC (CUSTOM PROCEDURE TRAY) ×3 IMPLANT
TRAY PROCTOSCOPIC FIBER OPTIC (SET/KITS/TRAYS/PACK) ×1 IMPLANT
TROCAR XCEL 12X100 BLDLESS (ENDOMECHANICALS) IMPLANT
TROCAR XCEL BLUNT TIP 100MML (ENDOMECHANICALS) ×1 IMPLANT
TROCAR XCEL NON-BLD 11X100MML (ENDOMECHANICALS) IMPLANT
TROCAR XCEL NON-BLD 5MMX100MML (ENDOMECHANICALS) ×3 IMPLANT
TUBE CONNECTING 12X1/4 (SUCTIONS) ×6 IMPLANT
TUBING FILTER THERMOFLATOR (ELECTROSURGICAL) ×3 IMPLANT
WATER STERILE IRR 1000ML POUR (IV SOLUTION) IMPLANT
YANKAUER SUCT BULB TIP NO VENT (SUCTIONS) ×6 IMPLANT

## 2012-11-04 NOTE — Transfer of Care (Signed)
Immediate Anesthesia Transfer of Care Note  Patient: Emma Henderson  Procedure(s) Performed: Procedure(s): LAPAROSCOPIC SIGMOID COLECTOMY (N/A) RIGID PROCTOSCOPY (N/A)  Patient Location: PACU  Anesthesia Type:General  Level of Consciousness: awake, alert  and oriented  Airway & Oxygen Therapy: Patient Spontanous Breathing, Patient connected to nasal cannula oxygen and Patient connected to face mask oxygen  Post-op Assessment: Report given to PACU RN, Post -op Vital signs reviewed and stable and Patient moving all extremities  Post vital signs: Reviewed and stable  Complications: No apparent anesthesia complications

## 2012-11-04 NOTE — Preoperative (Signed)
Beta Blockers   Reason not to administer Beta Blockers:Not Applicable 

## 2012-11-04 NOTE — Interval H&P Note (Signed)
History and Physical Interval Note: no change in H and P  11/04/2012 10:35 AM  Emma Henderson  has presented today for surgery, with the diagnosis of ABSCESS DIVERTICULAR   The various methods of treatment have been discussed with the patient and family. After consideration of risks, benefits and other options for treatment, the patient has consented to  Procedure(s): LAPAROSCOPIC PARTIAL COLECTOMY (N/A) as a surgical intervention .  The patient's history has been reviewed, patient examined, no change in status, stable for surgery.  I have reviewed the patient's chart and labs.  Questions were answered to the patient's satisfaction.     Emma Henderson A

## 2012-11-04 NOTE — Anesthesia Procedure Notes (Signed)
Procedure Name: Intubation Date/Time: 11/04/2012 10:53 AM Performed by: Charm Barges, Kajuan Guyton R Pre-anesthesia Checklist: Patient identified, Emergency Drugs available, Suction available, Patient being monitored and Timeout performed Patient Re-evaluated:Patient Re-evaluated prior to inductionOxygen Delivery Method: Circle system utilized Preoxygenation: Pre-oxygenation with 100% oxygen Intubation Type: IV induction Ventilation: Mask ventilation without difficulty Laryngoscope Size: Mac and 3 (McGrath Mac 3) Grade View: Grade II Tube type: Oral Tube size: 7.5 mm Number of attempts: 2 Airway Equipment and Method: Stylet Placement Confirmation: ETT inserted through vocal cords under direct vision,  positive ETCO2 and breath sounds checked- equal and bilateral Secured at: 21 cm Tube secured with: Tape Dental Injury: Teeth and Oropharynx as per pre-operative assessment

## 2012-11-04 NOTE — Op Note (Addendum)
LAPAROSCOPIC PARTIAL COLECTOMY  Procedure Note  Emma Henderson 11/04/2012   Pre-op Diagnosis: ABSCESS DIVERTICULAR      Post-op Diagnosis: same  Procedure(s): LAPAROSCOPIC PARTIAL COLECTOMY RIGID PROCTOSCOPY  Surgeon(s): Shelly Rubenstein, MD  Anesthesia: General  Staff:  Circulator: Simeon Craft, RN Relief Circulator: Jolinda Croak, RN Scrub Person: Lina Sayre, RN; Janeece Agee Pingue, CST Circulator Assistant: Jani Files, RN  Estimated Blood Loss: Minimal               Specimens: sigmoid colon          Emma Henderson   Date: 11/04/2012  Time: 12:48 PM

## 2012-11-04 NOTE — Anesthesia Postprocedure Evaluation (Signed)
  Anesthesia Post-op Note  Patient: Emma Henderson  Procedure(s) Performed: Procedure(s): LAPAROSCOPIC SIGMOID COLECTOMY (N/A) RIGID PROCTOSCOPY (N/A)  Patient Location: PACU  Anesthesia Type:General  Level of Consciousness: awake, alert  and oriented  Airway and Oxygen Therapy: Patient Spontanous Breathing  Post-op Pain: mild  Post-op Assessment: Post-op Vital signs reviewed, Patient's Cardiovascular Status Stable, Respiratory Function Stable, Patent Airway and Pain level controlled  Post-op Vital Signs: stable  Complications: No apparent anesthesia complications

## 2012-11-04 NOTE — Anesthesia Preprocedure Evaluation (Signed)
Anesthesia Evaluation  Patient identified by MRN, date of birth, ID band Patient awake    Reviewed: Allergy & Precautions, H&P , NPO status , Patient's Chart, lab work & pertinent test results  Airway Mallampati: II TM Distance: >3 FB Neck ROM: Full    Dental  (+) Dental Advisory Given   Pulmonary  breath sounds clear to auscultation        Cardiovascular Rhythm:Regular Rate:Normal     Neuro/Psych    GI/Hepatic   Endo/Other    Renal/GU      Musculoskeletal   Abdominal   Peds  Hematology   Anesthesia Other Findings   Reproductive/Obstetrics                           Anesthesia Physical Anesthesia Plan  ASA: II  Anesthesia Plan: General   Post-op Pain Management:    Induction: Intravenous  Airway Management Planned: Oral ETT  Additional Equipment:   Intra-op Plan:   Post-operative Plan: Extubation in OR  Informed Consent: I have reviewed the patients History and Physical, chart, labs and discussed the procedure including the risks, benefits and alternatives for the proposed anesthesia with the patient or authorized representative who has indicated his/her understanding and acceptance.   Dental advisory given  Plan Discussed with: CRNA and Anesthesiologist  Anesthesia Plan Comments: (H/O diverticulitis with abscess drained percutaneously 06/2013 Plan GA with oral ETT   Kipp Brood, MD)        Anesthesia Quick Evaluation

## 2012-11-05 LAB — BASIC METABOLIC PANEL
BUN: 12 mg/dL (ref 6–23)
Calcium: 8.7 mg/dL (ref 8.4–10.5)
Chloride: 103 mEq/L (ref 96–112)
GFR calc Af Amer: >90 mL/min (ref >90)
GFR calc non Af Amer: >90 mL/min (ref >90)
Potassium: 4.6 mEq/L (ref 3.5–5.1)
Sodium: 136 mEq/L (ref 135–145)

## 2012-11-05 LAB — CBC
HCT: 37.3 % (ref 36.0–46.0)
Hemoglobin: 12.5 g/dL (ref 12.0–15.0)
MCHC: 33.5 g/dL (ref 30.0–36.0)
RBC: 4.43 MIL/uL (ref 3.87–5.11)
WBC: 14.2 10*3/uL — ABNORMAL HIGH (ref 4.0–10.5)

## 2012-11-05 MED ORDER — KETOROLAC TROMETHAMINE 30 MG/ML IJ SOLN
30.0000 mg | Freq: Once | INTRAMUSCULAR | Status: AC
  Administered 2012-11-05: 30 mg via INTRAVENOUS
  Filled 2012-11-05: qty 1

## 2012-11-05 MED ORDER — MORPHINE SULFATE 2 MG/ML IJ SOLN: 1.0000 mg | INTRAMUSCULAR | Status: DC | PRN

## 2012-11-05 MED ORDER — KETOROLAC TROMETHAMINE 30 MG/ML IJ SOLN
30.0000 mg | Freq: Three times a day (TID) | INTRAMUSCULAR | Status: AC | PRN
  Administered 2012-11-05 – 2012-11-06 (×2): 30 mg via INTRAVENOUS
  Filled 2012-11-05 (×2): qty 1

## 2012-11-05 NOTE — Op Note (Signed)
Emma Henderson, Emma Henderson NO.:  [id number]  MEDICAL RECORD NO.:  [id number]  LOCATION:  [id number]                        FACILITY:  [facility]  PHYSICIAN:  Abigail Miyamoto, M.D. DATE OF BIRTH:  26-Feb-1945  DATE OF PROCEDURE:  11/04/2012 DATE OF DISCHARGE:                              OPERATIVE REPORT   PREOPERATIVE DIAGNOSIS:  History of diverticular abscess of the colon.  POSTOPERATIVE DIAGNOSIS:  History of diverticular abscess of the colon.  PROCEDURE:  Laparoscopic-assisted sigmoid colectomy, rigid proctoscopy.  SURGEON:  Abigail Miyamoto, M.D.  ASSISTANT:  Arnaldo Natal, NP.  ANESTHESIA:  General endotracheal anesthesia.  ESTIMATED BLOOD LOSS:  Minimal.  INDICATIONS:  This is a 67 year old female who presented this past summer with diverticulitis with a pelvic abscess.  She underwent percutaneous drainage by Interventional Radiology.  She then had a followup colonoscopy, which showed large diverticula without other abnormalities.  Because of the large diverticula and previous abscess, decision was made to proceed with an elective partial colectomy.  PROCEDURE IN DETAIL:  The patient was brought to the operating room, identified as Emma Henderson.  She was placed supine on the operating room table and general anesthesia was induced.  The patient was then placed in lithotomy position.  Her abdomen was then prepped and draped in usual sterile fashion after Foley catheter was inserted.  I made a small incision just below the umbilicus and took this down to the hernia defect.  I opened the fascia at the hernia defect.  I then placed 0- Vicryl pursestring suture around the fascial opening.  I was able to pass the peritoneal cavity under direct vision with a hemostat and then placed the Hasson port through the opening and insufflation of the abdomen was begun.  I then placed two 5-mm ports in the patient's right lower quadrant.  The patient had a redundant sigmoid colon.   An area of inflammation abscess in the right side of pelvis was easily identified. I was able to take down several attachments with laparoscopic scissors on both sides of the pelvis and was able to then free up the area that was abscess.  I then mobilized the rest of the descending colon along the white line of Toldt.  I did take down minimal adhesions and with the __________ being so redundant, it was easy to pull up to the abdominal wall.  I then made a small midline incision in the lower abdomen and terminated the laparoscopic portion of the procedure.  I placed a wound protector into the incision and was able to easily pull the sigmoid colon up out of the small incision.  I then transected the proximal colon with a GIA 75 stapler.  I then took down the mesentery with the ligature as well as 2-0 silk ties.  I then dissected down towards the rectum.  I then transected the rectum with a contour stapler.  I then took down the rest of the mesentery with the ligature.  The sigmoid colon with the area of the large diverticula and previous abscess was then completely excised and sent to Pathology for evaluation.  I evaluated the rectal stump and hemostasis appeared to be  achieved. There was 1 small hematoma on the right lateral side, which I placed a suture in for control.  I then opened up the proximal end of colon along the staple line.  I then brought a 29 EEA stapler onto the field.  I placed a 2-0 Prolene pursestring suture around the open end of the colon and placed anvil in this end and then sutured down the pursestring suture.  The 29 EEA stapler was then brought up through the anus and manipulated towards the rectal stump.  I elected to bring it out anteriorly, stapling device was then opened up and the pointed end came to the bowel quite well in the center of the anterior wall.  I then attached the anvil to this portion of the stapler and then the stapler was closed bringing the  colon down to the rectum.  Once the staple was completely closed, it was fired creating the colorectal anastomosis. The stapling device was then removed and the donuts were examined and found to be intact.  One wall was slightly thin.  A clamp was placed proximally on the bowel and pelvis was placed in the fluid.  The anastomosis was then examined via proctoscopy.  The anastomosis was found to be intact.  No evidence of leak was identified.  Abdomen was insufflated with air in the pelvis full of fluid.  I then reinforced the staple line with interrupted silk sutures.  The proctoscope had been removed as well as rest of the air.  I then thoroughly irrigated the abdomen with several L of normal saline.  Again, the anastomosis appeared pink, well-perfused, and floppy.  I then closed the small fascial defect at the umbilicus with an 0-Novafil suture in a figure-of- eight fashion.  I removed the previously placed Vicryl suture.  I then closed the patient's midline fascia with a running looped PDS suture. The skin was then irrigated and closed with skin staples at all incisions.  Occlusive dressings were then applied.  The patient tolerated the procedure well.  All sponge, needle, and instrument counts were correct at the end of procedure.  The patient was then extubated in the operating room and taken in a stable condition to recovery.     Abigail Miyamoto, M.D.     DB/MEDQ  D:  11/04/2012  T:  11/05/2012  Job:  [id number]

## 2012-11-05 NOTE — Progress Notes (Signed)
1 Day Post-Op  Subjective: POD#1 comfortable  Objective: Vital signs in last 24 hours: Temp:  [97.5 F (36.4 C)-98 F (36.7 C)] 98 F (36.7 C) (10/16 0640) Pulse Rate:  [58-102] 93 (10/16 0640) Resp:  [9-20] 17 (10/16 0640) BP: (145-160)/(63-83) 148/63 mmHg (10/16 0640) SpO2:  [94 %-100 %] 99 % (10/16 0640) Weight:  [139 lb 1.8 oz (63.1 kg)] 139 lb 1.8 oz (63.1 kg) (10/15 1454)    Intake/Output from previous day: 10/15 0701 - 10/16 0700 In: 3345.8 [I.V.:3345.8] Out: 1600 [Urine:1600] Intake/Output this shift:    Abdomen appropriately tender, mildly full Dressing dry Lungs clear  Lab Results:   Recent Labs  11/05/12 0431  WBC 14.2*  HGB 12.5  HCT 37.3  PLT 243   BMET  Recent Labs  11/05/12 0431  NA 136  K 4.6  CL 103  CO2 20  GLUCOSE 118*  BUN 12  CREATININE 0.64  CALCIUM 8.7   PT/INR No results found for this basename: LABPROT, INR,  in the last 72 hours ABG No results found for this basename: PHART, PCO2, PO2, HCO3,  in the last 72 hours  Studies/Results: No results found.  Anti-infectives: Anti-infectives   Start     Dose/Rate Route Frequency Ordered Stop   11/04/12 0600  ceFAZolin (ANCEF) IVPB 2 g/50 mL premix     2 g 100 mL/hr over 30 Minutes Intravenous On call to O.R. 11/03/12 1408 11/04/12 1100   11/04/12 0600  metroNIDAZOLE (FLAGYL) IVPB 500 mg     500 mg 100 mL/hr over 60 Minutes Intravenous On call to O.R. 11/03/12 1408 11/04/12 1110      Assessment/Plan: s/p Procedure(s): LAPAROSCOPIC SIGMOID COLECTOMY (N/A) RIGID PROCTOSCOPY (N/A)  D/c foley Entereg protocol Ambulate Clear liquids  LOS: 1 day    Bailey Kolbe A 11/05/2012

## 2012-11-06 ENCOUNTER — Encounter (HOSPITAL_COMMUNITY): Payer: Self-pay | Admitting: Surgery

## 2012-11-06 MED ORDER — WHITE PETROLATUM GEL
Status: AC
  Administered 2012-11-06: 0.2
  Filled 2012-11-06: qty 5

## 2012-11-06 MED ORDER — ACETAMINOPHEN 325 MG PO TABS
650.0000 mg | ORAL_TABLET | ORAL | Status: DC | PRN
  Administered 2012-11-06 – 2012-11-08 (×2): 650 mg via ORAL
  Filled 2012-11-06 (×2): qty 2

## 2012-11-06 MED ORDER — IBUPROFEN 600 MG PO TABS
600.0000 mg | ORAL_TABLET | Freq: Three times a day (TID) | ORAL | Status: DC | PRN
  Administered 2012-11-06 – 2012-11-07 (×2): 600 mg via ORAL
  Filled 2012-11-06 (×2): qty 1

## 2012-11-06 NOTE — Progress Notes (Signed)
MEDICATION RELATED NOTE  Pharmacy Re: Alvimopan (Entereg) Indication:    ENTEREG is indicated to accelerate the time to upper and lower gastrointestinal recovery following surgeries that include partial bowel resection with primary anastomosis.   Important Safety Information for ENTEREG  Contraindications  ENTEREG Capsules are contraindicated in patients who have taken therapeutic doses of opioids for more than 7 consecutive days immediately prior to taking ENTEREG   Warnings and Precautions  There were more reports of myocardial infarctions in patients treated with alvimopan 0.5 mg twice daily compared to placebo.  Assessment: This is a 67 yo female who is now s/p Laparoscopic-assisted sigmoid colectomy, rigid proctoscopy.   She was prescribed by Dr. Magnus Ivan, Alvimopan - Entereg 12 mg preop and to continue post-op for a total of 7 days.  She has had 5 doses and today has had noted bowel sounds, flatus and a stool.  Per our P&T policy and to minimize her increased risk for possible MI, we will discontinue this therapy.  P&T Policy: If bowel recovery (documented return of bowel sounds and a bowel movement confirmed by RN or patient) occurs before completion of 7 days of therapy, a pharmacist may discontinue alvimopan and write a communication note for the MD letting him/her know.   Medications:  Scheduled:  . alvimopan  12 mg Oral BID  . antiseptic oral rinse  15 mL Mouth Rinse q12n4p  . chlorhexidine  15 mL Mouth Rinse BID  . enoxaparin (LOVENOX) injection  40 mg Subcutaneous Q24H   Plan:  1.  Discontinue her Alvimopan.  Nadara Mustard, PharmD., MS Clinical Pharmacist Pager:  [phone] Thank you for allowing pharmacy to be part of this patients care team. 11/06/2012,2:00 PM

## 2012-11-06 NOTE — Progress Notes (Signed)
2 Days Post-Op  Subjective: No complaints Passing flatus  Objective: Vital signs in last 24 hours: Temp:  [97.3 F (36.3 C)-98.8 F (37.1 C)] 98.4 F (36.9 C) (10/17 0559) Pulse Rate:  [72-78] 76 (10/17 0559) Resp:  [17-19] 18 (10/17 0559) BP: (148-157)/(62-80) 148/78 mmHg (10/17 0559) SpO2:  [96 %-100 %] 96 % (10/17 0559)    Intake/Output from previous day: 10/16 0701 - 10/17 0700 In: 1029.6 [P.O.:240; I.V.:789.6] Out: 310 [Urine:310] Intake/Output this shift:   Abdomen soft, dressings dry No distension, good bowel sounds  Lab Results:   Recent Labs  11/05/12 0431  WBC 14.2*  HGB 12.5  HCT 37.3  PLT 243   BMET  Recent Labs  11/05/12 0431  NA 136  K 4.6  CL 103  CO2 20  GLUCOSE 118*  BUN 12  CREATININE 0.64  CALCIUM 8.7   PT/INR No results found for this basename: LABPROT, INR,  in the last 72 hours ABG No results found for this basename: PHART, PCO2, PO2, HCO3,  in the last 72 hours  Studies/Results: No results found.  Anti-infectives: Anti-infectives   Start     Dose/Rate Route Frequency Ordered Stop   11/04/12 0600  ceFAZolin (ANCEF) IVPB 2 g/50 mL premix     2 g 100 mL/hr over 30 Minutes Intravenous On call to O.R. 11/03/12 1408 11/04/12 1100   11/04/12 0600  metroNIDAZOLE (FLAGYL) IVPB 500 mg     500 mg 100 mL/hr over 60 Minutes Intravenous On call to O.R. 11/03/12 1408 11/04/12 1110      Assessment/Plan: s/p Procedure(s): LAPAROSCOPIC SIGMOID COLECTOMY (N/A) RIGID PROCTOSCOPY (N/A)  Full liquid diet today   LOS: 2 days    Zohal Reny A 11/06/2012

## 2012-11-07 NOTE — Progress Notes (Signed)
3 Days Post-Op  Subjective: Passing flatus and had some BM's Denies any nausea or abdominal pain  Objective: Vital signs in last 24 hours: Temp:  [97.6 F (36.4 C)-98.3 F (36.8 C)] 98.3 F (36.8 C) (10/18 0535) Pulse Rate:  [62-77] 63 (10/18 0535) Resp:  [16] 16 (10/18 0535) BP: (156-165)/(72-84) 156/84 mmHg (10/18 0535) SpO2:  [95 %-100 %] 95 % (10/18 0535) Last BM Date: 11/06/12  Intake/Output from previous day: 10/17 0701 - 10/18 0700 In: 3180.3 [P.O.:850; I.V.:2330.3] Out: -  Intake/Output this shift: Total I/O In: 1883.3 [P.O.:250; I.V.:1633.3] Out: -   Abdomen is soft, non tender and non distended  Lab Results:   Recent Labs  11/05/12 0431  WBC 14.2*  HGB 12.5  HCT 37.3  PLT 243   BMET  Recent Labs  11/05/12 0431  NA 136  K 4.6  CL 103  CO2 20  GLUCOSE 118*  BUN 12  CREATININE 0.64  CALCIUM 8.7   PT/INR No results found for this basename: LABPROT, INR,  in the last 72 hours ABG No results found for this basename: PHART, PCO2, PO2, HCO3,  in the last 72 hours  Studies/Results: No results found.  Anti-infectives: Anti-infectives   Start     Dose/Rate Route Frequency Ordered Stop   11/04/12 0600  ceFAZolin (ANCEF) IVPB 2 g/50 mL premix     2 g 100 mL/hr over 30 Minutes Intravenous On call to O.R. 11/03/12 1408 11/04/12 1100   11/04/12 0600  metroNIDAZOLE (FLAGYL) IVPB 500 mg     500 mg 100 mL/hr over 60 Minutes Intravenous On call to O.R. 11/03/12 1408 11/04/12 1110      Assessment/Plan: s/p Procedure(s): LAPAROSCOPIC SIGMOID COLECTOMY (N/A) RIGID PROCTOSCOPY (N/A)  Keep on full liquids Saline lock IV  LOS: 3 days    Lynford Espinoza A 11/07/2012

## 2012-11-08 NOTE — Progress Notes (Signed)
4 Days Post-Op  Subjective: Tolerating po and having BM's No complaints  Objective: Vital signs in last 24 hours: Temp:  [97.9 F (36.6 C)] 97.9 F (36.6 C) (10/19 1610) Pulse Rate:  [72-79] 79 (10/19 0611) Resp:  [18-20] 18 (10/19 0611) BP: (145-148)/(74-78) 147/76 mmHg (10/19 0611) SpO2:  [97 %-99 %] 98 % (10/19 0611) Last BM Date: 11/06/12  Intake/Output from previous day:   Intake/Output this shift:    abd soft, non distended, incisions clean  Lab Results:  No results found for this basename: WBC, HGB, HCT, PLT,  in the last 72 hours BMET No results found for this basename: NA, K, CL, CO2, GLUCOSE, BUN, CREATININE, CALCIUM,  in the last 72 hours PT/INR No results found for this basename: LABPROT, INR,  in the last 72 hours ABG No results found for this basename: PHART, PCO2, PO2, HCO3,  in the last 72 hours  Studies/Results: No results found.  Anti-infectives: Anti-infectives   Start     Dose/Rate Route Frequency Ordered Stop   11/04/12 0600  ceFAZolin (ANCEF) IVPB 2 g/50 mL premix     2 g 100 mL/hr over 30 Minutes Intravenous On call to O.R. 11/03/12 1408 11/04/12 1100   11/04/12 0600  metroNIDAZOLE (FLAGYL) IVPB 500 mg     500 mg 100 mL/hr over 60 Minutes Intravenous On call to O.R. 11/03/12 1408 11/04/12 1110      Assessment/Plan: s/p Procedure(s): LAPAROSCOPIC SIGMOID COLECTOMY (N/Henderson) RIGID PROCTOSCOPY (N/Henderson)  Discharge home   LOS: 4 days    Emma Henderson 11/08/2012

## 2012-11-08 NOTE — Discharge Summary (Signed)
Physician Discharge Summary  Patient ID: Emma Henderson MRN: [id number] DOB/AGE: 04/23/1945 67 y.o.  Admit date: 11/04/2012 Discharge date: 11/08/2012  Admission Diagnoses:  Discharge Diagnoses:  Active Problems:   * No active hospital problems. * History of Diverticulitis with Abscess  Discharged Condition: good  Hospital Course: admitted post on.  On Entereg protocol.  Did well.  Diet advanced daily.  Starting having BM's POD#3.  Discharged home POD#4  Consults: None  Significant Diagnostic Studies:   Treatments: surgery: Laparoscopic assisted sigmoid colectomy  Discharge Exam: Blood pressure 147/76, pulse 79, temperature 97.9 F (36.6 C), temperature source Oral, resp. rate 18, height 5\' 2"  (1.575 m), weight 139 lb 1.8 oz (63.1 kg), last menstrual period 01/21/1998, SpO2 98.00%. General appearance: alert, cooperative and no distress Resp: clear to auscultation bilaterally Cardio: regular rate and rhythm, S1, S2 normal, no murmur, click, rub or gallop Incision/Wound: abdomen soft, non tender, non distended, incision healing well  Disposition: 01-Home or Self Care     Medication List         acetaminophen 500 MG tablet  Commonly known as:  TYLENOL  Take 500 mg by mouth every 6 (six) hours as needed for pain.           Follow-up Information   Follow up with [facility], MD. Call on 11/12/2012. (call monday and ask for [facility].  need to see Thurs for staple removal)    Specialty:  General Surgery   Contact information:   [address] [place] [address] [phone]       Signed: Shelly Rubenstein 11/08/2012, 7:37 AM

## 2012-11-08 NOTE — Progress Notes (Signed)
Discharge Note. Pt given discharge instructions. Pt asked appropriate questions about her diet when returning home. Pt verbalized understanding of discharge information. Pt waiting on daughter for ride home. Ready for discharge.

## 2012-11-11 ENCOUNTER — Other Ambulatory Visit (INDEPENDENT_AMBULATORY_CARE_PROVIDER_SITE_OTHER): Payer: Self-pay | Admitting: *Deleted

## 2012-11-11 ENCOUNTER — Telehealth (INDEPENDENT_AMBULATORY_CARE_PROVIDER_SITE_OTHER): Payer: Self-pay | Admitting: General Surgery

## 2012-11-11 MED ORDER — SULFAMETHOXAZOLE-TRIMETHOPRIM 800-160 MG PO TABS: 1.0000 | ORAL_TABLET | Freq: Two times a day (BID) | ORAL | Status: DC

## 2012-11-11 NOTE — Telephone Encounter (Signed)
Call in bactrim DS one po bid for 7 days

## 2012-11-11 NOTE — Telephone Encounter (Signed)
Per Dr. Eliberto Ivory response, [facility] pharmacy e-mailed with Rx for Bactrim DS.  Pt notified to pick up later this afternoon.

## 2012-11-11 NOTE — Telephone Encounter (Signed)
Pt called from daughter's home (where she is receiving post op attention.) She complains today of having urinary frequency and burning, so suspects another UTI.  Her PCP is in [place].  Asking if Dr. Magnus Ivan will prescribe for this.  She is avoiding all caffeine and pushing water and cranberry juice.  Has appt tomorrow with DB for postop recheck and staple removal.  Please advise.

## 2012-11-12 ENCOUNTER — Encounter (INDEPENDENT_AMBULATORY_CARE_PROVIDER_SITE_OTHER): Payer: Self-pay | Admitting: Surgery

## 2012-11-12 ENCOUNTER — Ambulatory Visit (INDEPENDENT_AMBULATORY_CARE_PROVIDER_SITE_OTHER): Payer: Medicare Other | Admitting: Surgery

## 2012-11-12 ENCOUNTER — Encounter (INDEPENDENT_AMBULATORY_CARE_PROVIDER_SITE_OTHER): Payer: Medicare Other

## 2012-11-12 VITALS — BP 130/76 | HR 80 | Temp 97.2°F | Resp 16 | Ht 63.0 in | Wt 134.0 lb

## 2012-11-12 DIAGNOSIS — Z09 Encounter for follow-up examination after completed treatment for conditions other than malignant neoplasm: Secondary | ICD-10-CM

## 2012-11-12 NOTE — Progress Notes (Signed)
Subjective:     Patient ID: Emma Henderson, female   DOB: 06-Apr-1945, 67 y.o.   MRN: [id number]  HPI She is one-week status post laparoscopic assisted sigmoid colectomy for a history of diverticular abscess. She reports she is doing well except for some dysuria and urinary frequency. I started her on Bactrim yesterday. She has had urinary tract infections in the past. She denies fevers. She is moving her bowels well  Review of Systems     Objective:   Physical Exam On exam, her abdomen is soft and nontender. Her incisions are healing well. I removed the staples and place Steri-Strips    Assessment:     Patient stable postop     Plan:     She will continue the course of antibiotics for urinary tract infection. She will continue to refrain from heavy lifting. I will see her back in 3 weeks. She will call me prior to that if the UTI persists

## 2012-11-30 ENCOUNTER — Encounter (INDEPENDENT_AMBULATORY_CARE_PROVIDER_SITE_OTHER): Payer: Self-pay | Admitting: Surgery

## 2012-11-30 ENCOUNTER — Ambulatory Visit (INDEPENDENT_AMBULATORY_CARE_PROVIDER_SITE_OTHER): Payer: Medicare Other | Admitting: Surgery

## 2012-11-30 VITALS — BP 140/84 | HR 68 | Temp 98.9°F | Resp 14 | Ht 63.0 in | Wt 137.4 lb

## 2012-11-30 DIAGNOSIS — Z09 Encounter for follow-up examination after completed treatment for conditions other than malignant neoplasm: Secondary | ICD-10-CM

## 2012-11-30 NOTE — Progress Notes (Signed)
Subjective:     Patient ID: Emma Henderson, female   DOB: 19-Mar-1945, 67 y.o.   MRN: [id number]  HPI She is here for another postoperative visit. She is feeling well has no complaints. She is moving her bowels well  Review of Systems     Objective:   Physical Exam Her incision is well healed and her abdomen is soft and nontender    Assessment:     Patient stable postop     Plan:     She will refrain from heavy lifting until after November 26. She may resume all her activities. I will see her back as needed

## 2013-02-16 ENCOUNTER — Other Ambulatory Visit: Payer: Self-pay | Admitting: Gynecology

## 2013-02-16 DIAGNOSIS — Z139 Encounter for screening, unspecified: Secondary | ICD-10-CM

## 2013-02-25 ENCOUNTER — Ambulatory Visit (HOSPITAL_COMMUNITY)
Admission: RE | Admit: 2013-02-25 | Discharge: 2013-02-25 | Disposition: A | Discharge status: Home / Self Care | Payer: Medicare Other | Source: Ambulatory Visit | Attending: Gynecology | Admitting: Gynecology

## 2013-02-25 DIAGNOSIS — Z1231 Encounter for screening mammogram for malignant neoplasm of breast: Secondary | ICD-10-CM | POA: Insufficient documentation

## 2013-02-25 DIAGNOSIS — Z139 Encounter for screening, unspecified: Secondary | ICD-10-CM

## 2013-03-18 ENCOUNTER — Encounter: Payer: Medicare Other | Admitting: Gynecology

## 2013-04-12 ENCOUNTER — Ambulatory Visit (INDEPENDENT_AMBULATORY_CARE_PROVIDER_SITE_OTHER): Payer: Medicare Other | Admitting: Gynecology

## 2013-04-12 ENCOUNTER — Encounter: Payer: Self-pay | Admitting: Gynecology

## 2013-04-12 VITALS — BP 120/74 | Ht 63.0 in | Wt 140.0 lb

## 2013-04-12 DIAGNOSIS — N952 Postmenopausal atrophic vaginitis: Secondary | ICD-10-CM

## 2013-04-12 DIAGNOSIS — N318 Other neuromuscular dysfunction of bladder: Secondary | ICD-10-CM

## 2013-04-12 DIAGNOSIS — N3281 Overactive bladder: Secondary | ICD-10-CM

## 2013-04-12 DIAGNOSIS — M899 Disorder of bone, unspecified: Secondary | ICD-10-CM

## 2013-04-12 DIAGNOSIS — M8580 Other specified disorders of bone density and structure, unspecified site: Secondary | ICD-10-CM

## 2013-04-12 DIAGNOSIS — M949 Disorder of cartilage, unspecified: Secondary | ICD-10-CM

## 2013-04-12 NOTE — Patient Instructions (Addendum)
Start on Vagifem as we discussed. One tablet in the vagina nightly for 12 nights and then 2 times weekly, call me at the end of the samples to let me know how you are doing. Schedule your bone density Followup with me in one year for annual exam.

## 2013-04-12 NOTE — Progress Notes (Addendum)
Emma Henderson [birth year] [id number]        67 y.o.  [id number] for followup exam.  Several issues noted below.  Past medical history,surgical history, problem list, medications, allergies, family history and social history were all reviewed and documented in the EPIC chart.  ROS:  Performed and pertinent positives and negatives are included in the history, assessment and plan .  Exam: Kim assistant Filed Vitals:   04/12/13 1122  BP: 120/74  Height: 5\' 3"  (1.6 m)  Weight: 140 lb (63.504 kg)   General appearance  Normal Skin grossly normal Head/Neck normal with no cervical or supraclavicular adenopathy thyroid normal Lungs  clear Cardiac RR, without RMG Abdominal  soft, nontender, without masses, organomegaly or hernia Breasts  examined lying and sitting without masses, retractions, discharge or axillary adenopathy. Pelvic  Ext/BUS/vagina with generalized atrophic changes  Cervix with atrophic changes  Uterus anteverted, normal size, shape and contour, midline and mobile nontender   Adnexa  Without masses or tenderness    Anus and perineum  Normal   Rectovaginal  Normal sphincter tone without palpated masses or tenderness.    Assessment/Plan:  68 y.o. [id number] female for annual exam.   1. Atrophic genital changes/urge incontinence.  Patient notes urgency symptoms becoming more bothersome to her. She is finding herself rushing to get to the bathroom and becoming incontinent. No significant stress such as laughing coughing sneezing loss of urine. She did seem to have an issue with this previously but does not appear to be a big issue now. She does have significant atrophic vaginal changes. Is not having issues as far as vaginal dryness or dyspareunia she is not sexually active. Options for management were reviewed to include urologic referral, trial of OAB medication and trial of vaginal estrogen support to see if this doesn't help. After a lengthy discussion as to the risks/benefits of each  choice to include possible estrogen absorption and risks of thrombosis such as stroke heart attack DVT, breast cancer and endometrial stimulation the patient wants a trial of Vagifem. 3 sample cards given. She will use it nightly for 12 nights and then twice weekly. She will call me at the end of the samples to see how she's doing. Patient knows if she does any vaginal bleeding she needs to call me. 2. Osteopenia. DEXA 01/2011 T score -1.3. No FRAX calculated. Recommend repeat DEXA now she agrees to arrange. Increase calcium vitamin D reviewed. 3. Pap smear 2014. No Pap smear done today. No history of significant abnormal Pap smears. Options to stop screening altogether she 52 age 34 or less frequent screening intervals reviewed. Will readdress on an annual basis. 4. Mammography 02/2013. Continue with annual mammography. SBE monthly reviewed. 5. Colonoscopy 2014. Had partial colectomy due to diverticular disease this past fall. Followup with her gastroenterologist as needed.  6. Health maintenance. No lab work done and she reports this all done through her primary physician's office. Followup one year, sooner as needed.   Note: This document was prepared with digital dictation and possible smart phrase technology. Any transcriptional errors that result from this process are unintentional.   Anastasio Auerbach MD, 11:49 AM 04/12/2013

## 2013-04-13 LAB — URINALYSIS W MICROSCOPIC + REFLEX CULTURE
BACTERIA UA: NONE SEEN
Bilirubin Urine: NEGATIVE
CASTS: NONE SEEN
Crystals: NONE SEEN
Glucose, UA: NEGATIVE mg/dL
HGB URINE DIPSTICK: NEGATIVE
KETONES UR: NEGATIVE mg/dL
Leukocytes, UA: NEGATIVE
Nitrite: NEGATIVE
PH: 6 (ref 5.0–8.0)
Protein, ur: NEGATIVE mg/dL
Specific Gravity, Urine: 1.013 (ref 1.005–1.030)
Squamous Epithelial / LPF: NONE SEEN
UROBILINOGEN UA: 0.2 mg/dL (ref 0.0–1.0)

## 2013-05-07 ENCOUNTER — Telehealth: Payer: Self-pay | Admitting: *Deleted

## 2013-05-07 NOTE — Telephone Encounter (Signed)
Pt called requesting dexa order faxed to [facility]. Order will be faxed.

## 2013-05-10 ENCOUNTER — Other Ambulatory Visit: Payer: Self-pay | Admitting: Gynecology

## 2013-05-10 DIAGNOSIS — M8580 Other specified disorders of bone density and structure, unspecified site: Secondary | ICD-10-CM

## 2013-05-12 ENCOUNTER — Telehealth: Payer: Self-pay | Admitting: *Deleted

## 2013-05-12 MED ORDER — ESTRADIOL 10 MCG VA TABS: 1.0000 | ORAL_TABLET | VAGINAL | Status: DC

## 2013-05-12 NOTE — Telephone Encounter (Signed)
Pt was given samples of vagifems 10 mcg on OV 04/12/13. Michela Pitcher doing well with them would like Rx this will be sent per OV note okay to do this.

## 2013-05-20 ENCOUNTER — Encounter: Payer: Self-pay | Admitting: Gynecology

## 2013-05-20 ENCOUNTER — Ambulatory Visit (HOSPITAL_COMMUNITY)
Admission: RE | Admit: 2013-05-20 | Discharge: 2013-05-20 | Disposition: A | Discharge status: Home / Self Care | Payer: Medicare Other | Source: Ambulatory Visit | Attending: Gynecology | Admitting: Gynecology

## 2013-05-20 ENCOUNTER — Telehealth: Payer: Self-pay | Admitting: Gynecology

## 2013-05-20 DIAGNOSIS — M949 Disorder of cartilage, unspecified: Principal | ICD-10-CM

## 2013-05-20 DIAGNOSIS — M8580 Other specified disorders of bone density and structure, unspecified site: Secondary | ICD-10-CM

## 2013-05-20 DIAGNOSIS — M899 Disorder of bone, unspecified: Secondary | ICD-10-CM | POA: Insufficient documentation

## 2013-05-20 DIAGNOSIS — Z780 Asymptomatic menopausal state: Secondary | ICD-10-CM | POA: Insufficient documentation

## 2013-05-20 NOTE — Telephone Encounter (Signed)
Left message for pt to call.

## 2013-05-20 NOTE — Telephone Encounter (Signed)
Tell patient that her bone density does show some loss of calcium in her hips. Not to the degree that I would suggest taking medication but I would recommend checking a vitamin D level to make sure maximizing this, increasing weightbearing exercise such as walking and rechecking a bone density in 2 years.

## 2013-05-21 NOTE — Telephone Encounter (Signed)
Left the below on pt voicemail per her request. Order placed. I told pt to call to make lab appointment

## 2013-05-21 NOTE — Addendum Note (Signed)
Addended by: Thamas Jaegers on: 05/21/2013 03:18 PM   Modules accepted: Orders

## 2013-05-25 ENCOUNTER — Other Ambulatory Visit: Payer: Medicare Other

## 2013-05-25 DIAGNOSIS — M8580 Other specified disorders of bone density and structure, unspecified site: Secondary | ICD-10-CM

## 2013-05-26 ENCOUNTER — Encounter: Payer: Self-pay | Admitting: Gynecology

## 2013-05-26 LAB — VITAMIN D 25 HYDROXY (VIT D DEFICIENCY, FRACTURES): Vit D, 25-Hydroxy: 48 ng/mL (ref 30–89)

## 2013-05-31 ENCOUNTER — Telehealth: Payer: Self-pay | Admitting: *Deleted

## 2013-05-31 NOTE — Telephone Encounter (Signed)
Pt informed with Vit.. D results on 05/25/13.

## 2013-11-05 ENCOUNTER — Other Ambulatory Visit: Payer: Self-pay

## 2013-11-22 ENCOUNTER — Encounter: Payer: Self-pay | Admitting: Gynecology

## 2014-01-27 ENCOUNTER — Other Ambulatory Visit: Payer: Self-pay | Admitting: Gynecology

## 2014-01-27 DIAGNOSIS — Z1231 Encounter for screening mammogram for malignant neoplasm of breast: Secondary | ICD-10-CM

## 2014-02-28 ENCOUNTER — Ambulatory Visit (HOSPITAL_COMMUNITY)
Admission: RE | Admit: 2014-02-28 | Discharge: 2014-02-28 | Disposition: A | Discharge status: Home / Self Care | Payer: Medicare Other | Source: Ambulatory Visit | Attending: Gynecology | Admitting: Gynecology

## 2014-02-28 DIAGNOSIS — Z1231 Encounter for screening mammogram for malignant neoplasm of breast: Secondary | ICD-10-CM

## 2014-04-28 ENCOUNTER — Encounter: Payer: Self-pay | Admitting: Gynecology

## 2014-04-28 ENCOUNTER — Ambulatory Visit (INDEPENDENT_AMBULATORY_CARE_PROVIDER_SITE_OTHER): Payer: Medicare Other | Admitting: Gynecology

## 2014-04-28 VITALS — BP 130/80 | Ht 63.0 in | Wt 144.0 lb

## 2014-04-28 DIAGNOSIS — Z01419 Encounter for gynecological examination (general) (routine) without abnormal findings: Secondary | ICD-10-CM | POA: Diagnosis not present

## 2014-04-28 DIAGNOSIS — N952 Postmenopausal atrophic vaginitis: Secondary | ICD-10-CM

## 2014-04-28 DIAGNOSIS — N3941 Urge incontinence: Secondary | ICD-10-CM

## 2014-04-28 DIAGNOSIS — M8580 Other specified disorders of bone density and structure, unspecified site: Secondary | ICD-10-CM

## 2014-04-28 MED ORDER — ESTRADIOL 10 MCG VA TABS: 1.0000 | ORAL_TABLET | VAGINAL | Status: DC

## 2014-04-28 NOTE — Patient Instructions (Signed)
You may obtain a copy of any labs that were done today by logging onto MyChart as outlined in the instructions provided with your AVS (after visit summary). The office will not call with normal lab results but certainly if there are any significant abnormalities then we will contact you.   Health Maintenance, Female A healthy lifestyle and preventative care can promote health and wellness.  Maintain regular health, dental, and eye exams.  Eat a healthy diet. Foods like vegetables, fruits, whole grains, low-fat dairy products, and lean protein foods contain the nutrients you need without too many calories. Decrease your intake of foods high in solid fats, added sugars, and salt. Get information about a proper diet from your caregiver, if necessary.  Regular physical exercise is one of the most important things you can do for your health. Most adults should get at least 150 minutes of moderate-intensity exercise (any activity that increases your heart rate and causes you to sweat) each week. In addition, most adults need muscle-strengthening exercises on 2 or more days a week.   Maintain a healthy weight. The body mass index (BMI) is a screening tool to identify possible weight problems. It provides an estimate of body fat based on height and weight. Your caregiver can help determine your BMI, and can help you achieve or maintain a healthy weight. For adults 20 years and older:  A BMI below 18.5 is considered underweight.  A BMI of 18.5 to 24.9 is normal.  A BMI of 25 to 29.9 is considered overweight.  A BMI of 30 and above is considered obese.  Maintain normal blood lipids and cholesterol by exercising and minimizing your intake of saturated fat. Eat a balanced diet with plenty of fruits and vegetables. Blood tests for lipids and cholesterol should begin at age 61 and be repeated every 5 years. If your lipid or cholesterol levels are high, you are over 50, or you are a high risk for heart  disease, you may need your cholesterol levels checked more frequently.Ongoing high lipid and cholesterol levels should be treated with medicines if diet and exercise are not effective.  If you smoke, find out from your caregiver how to quit. If you do not use tobacco, do not start.  Lung cancer screening is recommended for adults aged 33 80 years who are at high risk for developing lung cancer because of a history of smoking. Yearly low-dose computed tomography (CT) is recommended for people who have at least a 30-pack-year history of smoking and are a current smoker or have quit within the past 15 years. A pack year of smoking is smoking an average of 1 pack of cigarettes a day for 1 year (for example: 1 pack a day for 30 years or 2 packs a day for 15 years). Yearly screening should continue until the smoker has stopped smoking for at least 15 years. Yearly screening should also be stopped for people who develop a health problem that would prevent them from having lung cancer treatment.  If you are pregnant, do not drink alcohol. If you are breastfeeding, be very cautious about drinking alcohol. If you are not pregnant and choose to drink alcohol, do not exceed 1 drink per day. One drink is considered to be 12 ounces (355 mL) of beer, 5 ounces (148 mL) of wine, or 1.5 ounces (44 mL) of liquor.  Avoid use of street drugs. Do not share needles with anyone. Ask for help if you need support or instructions about stopping  the use of drugs.  High blood pressure causes heart disease and increases the risk of stroke. Blood pressure should be checked at least every 1 to 2 years. Ongoing high blood pressure should be treated with medicines, if weight loss and exercise are not effective.  If you are 59 to 69 years old, ask your caregiver if you should take aspirin to prevent strokes.  Diabetes screening involves taking a blood sample to check your fasting blood sugar level. This should be done once every 3  years, after age 91, if you are within normal weight and without risk factors for diabetes. Testing should be considered at a younger age or be carried out more frequently if you are overweight and have at least 1 risk factor for diabetes.  Breast cancer screening is essential preventative care for women. You should practice "breast self-awareness." This means understanding the normal appearance and feel of your breasts and may include breast self-examination. Any changes detected, no matter how small, should be reported to a caregiver. Women in their 66s and 30s should have a clinical breast exam (CBE) by a caregiver as part of a regular health exam every 1 to 3 years. After age 101, women should have a CBE every year. Starting at age 100, women should consider having a mammogram (breast X-ray) every year. Women who have a family history of breast cancer should talk to their caregiver about genetic screening. Women at a high risk of breast cancer should talk to their caregiver about having an MRI and a mammogram every year.  Breast cancer gene (BRCA)-related cancer risk assessment is recommended for women who have family members with BRCA-related cancers. BRCA-related cancers include breast, ovarian, tubal, and peritoneal cancers. Having family members with these cancers may be associated with an increased risk for harmful changes (mutations) in the breast cancer genes BRCA1 and BRCA2. Results of the assessment will determine the need for genetic counseling and BRCA1 and BRCA2 testing.  The Pap test is a screening test for cervical cancer. Women should have a Pap test starting at age 57. Between ages 25 and 35, Pap tests should be repeated every 2 years. Beginning at age 37, you should have a Pap test every 3 years as long as the past 3 Pap tests have been normal. If you had a hysterectomy for a problem that was not cancer or a condition that could lead to cancer, then you no longer need Pap tests. If you are  between ages 50 and 76, and you have had normal Pap tests going back 10 years, you no longer need Pap tests. If you have had past treatment for cervical cancer or a condition that could lead to cancer, you need Pap tests and screening for cancer for at least 20 years after your treatment. If Pap tests have been discontinued, risk factors (such as a new sexual partner) need to be reassessed to determine if screening should be resumed. Some women have medical problems that increase the chance of getting cervical cancer. In these cases, your caregiver may recommend more frequent screening and Pap tests.  The human papillomavirus (HPV) test is an additional test that may be used for cervical cancer screening. The HPV test looks for the virus that can cause the cell changes on the cervix. The cells collected during the Pap test can be tested for HPV. The HPV test could be used to screen women aged 44 years and older, and should be used in women of any age  who have unclear Pap test results. After the age of 55, women should have HPV testing at the same frequency as a Pap test.  Colorectal cancer can be detected and often prevented. Most routine colorectal cancer screening begins at the age of 44 and continues through age 20. However, your caregiver may recommend screening at an earlier age if you have risk factors for colon cancer. On a yearly basis, your caregiver may provide home test kits to check for hidden blood in the stool. Use of a small camera at the end of a tube, to directly examine the colon (sigmoidoscopy or colonoscopy), can detect the earliest forms of colorectal cancer. Talk to your caregiver about this at age 86, when routine screening begins. Direct examination of the colon should be repeated every 5 to 10 years through age 13, unless early forms of pre-cancerous polyps or small growths are found.  Hepatitis C blood testing is recommended for all people born from [birth year] through 1965 and any  individual with known risks for hepatitis C.  Practice safe sex. Use condoms and avoid high-risk sexual practices to reduce the spread of sexually transmitted infections (STIs). Sexually active women aged 36 and younger should be checked for Chlamydia, which is a common sexually transmitted infection. Older women with new or multiple partners should also be tested for Chlamydia. Testing for other STIs is recommended if you are sexually active and at increased risk.  Osteoporosis is a disease in which the bones lose minerals and strength with aging. This can result in serious bone fractures. The risk of osteoporosis can be identified using a bone density scan. Women ages 20 and over and women at risk for fractures or osteoporosis should discuss screening with their caregivers. Ask your caregiver whether you should be taking a calcium supplement or vitamin D to reduce the rate of osteoporosis.  Menopause can be associated with physical symptoms and risks. Hormone replacement therapy is available to decrease symptoms and risks. You should talk to your caregiver about whether hormone replacement therapy is right for you.  Use sunscreen. Apply sunscreen liberally and repeatedly throughout the day. You should seek shade when your shadow is shorter than you. Protect yourself by wearing long sleeves, pants, a wide-brimmed hat, and sunglasses year round, whenever you are outdoors.  Notify your caregiver of new moles or changes in moles, especially if there is a change in shape or color. Also notify your caregiver if a mole is larger than the size of a pencil eraser.  Stay current with your immunizations. Document Released: 07/23/2010 Document Revised: 05/04/2012 Document Reviewed: 07/23/2010 [facility] Patient Information 2014 [employer].

## 2014-04-28 NOTE — Progress Notes (Signed)
Emma Henderson 1945/02/01 [id number]        68 y.o.  G2P2002 for breast and pelvic exam. Several issues noted below.  Past medical history,surgical history, problem list, medications, allergies, family history and social history were all reviewed and documented as reviewed in the EPIC chart.  ROS:  Performed with pertinent positives and negatives included in the history, assessment and plan.   Additional significant findings :  none   Exam: Kim Counsellor Vitals:   04/28/14 1022  BP: 130/80  Height: 5\' 3"  (1.6 m)  Weight: 144 lb (65.318 kg)   General appearance:  Normal affect, orientation and appearance. Skin: Grossly normal HEENT: Without gross lesions.  No cervical or supraclavicular adenopathy. Thyroid normal.  Lungs:  Clear without wheezing, rales or rhonchi Cardiac: RR, without RMG Abdominal:  Soft, nontender, without masses, guarding, rebound, organomegaly or hernia Breasts:  Examined lying and sitting without masses, retractions, discharge or axillary adenopathy. Pelvic:  Ext/BUS/vagina with atrophic changes  Cervix with atrophic changes  Uterus anteverted, normal size, shape and contour, midline and mobile nontender   Adnexa  Without masses or tenderness    Anus and perineum  Normal   Rectovaginal  Normal sphincter tone without palpated masses or tenderness.    Assessment/Plan:  69 y.o. [id number] female for breast and pelvic exam.   1. Postmenopausal/atrophic genital changes.  Patient using Vagifem 10 g twice weekly with good results. She wants to continue. Not having other issues of hot flashes or night sweats.  I again discussed the issues and risks to include absorption with increased risk of thrombosis, endometrial stimulation and possible breast cancer. Patient understands and accepts the risks and I refilled her 1 year. No vaginal bleeding. Patient knows report any vaginal bleeding. 2. Urge incontinence. Doing somewhat better since starting the Vagifem. I  discussed behavior modification as far as timed voiding and avoiding bladder stimulants such as caffeine. OAB medications were reviewed to include type such as Vesicare and Enablex with side effect profile as well as Myrbetriq issues with blood pressure. At this point patients not interested in trying will call if she wants to do so. 3. Osteopenia.  DEXA 04/2013 T score -1.5.  Vitamin D 48 last year. Increased vitamin D and calcium recommendations reviewed. Plan repeat DEXA next year at two-year interval. 4. Mammography 02/2014. Continue with annual mammography. SBE monthly reviewed. 5. Pap smear/HPV 01/2012 negative. No Pap smear done today. No history of significant abnormal Pap smears previously. 6. Colonoscopy 2014. Repeat at their recommended interval. 7. Health maintenance. No routine blood work done as this is done at her primary physician's office. Follow up 1 year, sooner as needed.     Anastasio Auerbach MD, 10:57 AM 04/28/2014

## 2014-07-18 ENCOUNTER — Other Ambulatory Visit: Payer: Self-pay

## 2014-08-29 ENCOUNTER — Encounter (INDEPENDENT_AMBULATORY_CARE_PROVIDER_SITE_OTHER): Payer: Self-pay | Admitting: *Deleted

## 2015-02-27 ENCOUNTER — Other Ambulatory Visit: Payer: Self-pay | Admitting: Gynecology

## 2015-02-27 DIAGNOSIS — Z1231 Encounter for screening mammogram for malignant neoplasm of breast: Secondary | ICD-10-CM

## 2015-03-02 ENCOUNTER — Ambulatory Visit (HOSPITAL_COMMUNITY)
Admission: RE | Admit: 2015-03-02 | Discharge: 2015-03-02 | Disposition: A | Discharge status: Home / Self Care | Payer: Medicare Other | Source: Ambulatory Visit | Attending: Gynecology | Admitting: Gynecology

## 2015-03-02 DIAGNOSIS — Z1231 Encounter for screening mammogram for malignant neoplasm of breast: Secondary | ICD-10-CM | POA: Insufficient documentation

## 2015-05-04 ENCOUNTER — Encounter: Payer: Medicare Other | Admitting: Gynecology

## 2015-05-11 ENCOUNTER — Encounter: Payer: Self-pay | Admitting: Gynecology

## 2015-05-11 ENCOUNTER — Ambulatory Visit (INDEPENDENT_AMBULATORY_CARE_PROVIDER_SITE_OTHER): Payer: Medicare Other | Admitting: Gynecology

## 2015-05-11 VITALS — BP 134/74 | Ht 63.0 in | Wt 148.0 lb

## 2015-05-11 DIAGNOSIS — Z01419 Encounter for gynecological examination (general) (routine) without abnormal findings: Secondary | ICD-10-CM

## 2015-05-11 DIAGNOSIS — N952 Postmenopausal atrophic vaginitis: Secondary | ICD-10-CM | POA: Diagnosis not present

## 2015-05-11 DIAGNOSIS — M8580 Other specified disorders of bone density and structure, unspecified site: Secondary | ICD-10-CM

## 2015-05-11 MED ORDER — ESTRADIOL 10 MCG VA TABS
1.0000 | ORAL_TABLET | VAGINAL | Status: DC
Start: 2015-05-11 — End: 2016-05-13

## 2015-05-11 NOTE — Progress Notes (Signed)
    Emma Henderson [birth year] [id number]        70 y.o.  G2P2002  for breast and pelvic exam. Overall doing well without complaints.  Past medical history,surgical history, problem list, medications, allergies, family history and social history were all reviewed and documented as reviewed in the EPIC chart.  ROS:  Performed with pertinent positives and negatives included in the history, assessment and plan.   Additional significant findings :  none   Exam: Caryn Bee assistant Filed Vitals:   05/11/15 1031  BP: 134/74  Height: 5\' 3"  (1.6 m)  Weight: 148 lb (67.132 kg)   General appearance:  Normal affect, orientation and appearance. Skin: Grossly normal HEENT: Without gross lesions.  No cervical or supraclavicular adenopathy. Thyroid normal.  Lungs:  Clear without wheezing, rales or rhonchi Cardiac: RR, without RMG Abdominal:  Soft, nontender, without masses, guarding, rebound, organomegaly or hernia Breasts:  Examined lying and sitting without masses, retractions, discharge or axillary adenopathy. Pelvic:  Ext/BUS/vagina with atrophic changes  Cervix was atrophic changes  Uterus anteverted, normal size, shape and contour, midline and mobile nontender   Adnexa without masses or tenderness    Anus and perineum normal   Rectovaginal normal sphincter tone without palpated masses or tenderness.    Assessment/Plan:  70 y.o. [id number] female for breast and pelvic exam  1. Post menopausal/atrophic genital changes. Patient continues on Vagifem 10 g twice weekly with good results and wants to continue. Refill 1 year provided. No significant hot flashes or night sweats. No vaginal bleeding. I again reviewed the issues to include absorption with systemic risks. Call if any vaginal bleeding or other issues. 2. Osteopenia. DEXA 04/2013 T score -1.5. Repeat DEXA now a 2 year interval and she will schedule. 3. Mammography 02/2015. Continue with annual mammography when due. SBE monthly  reviewed. 4. Pap smear/HPV 01/2012. No Pap smear done today. No history of abnormal Pap smears previously.  Options to stop screening per current screening guidelines that she is over the age of 47 reviewed. Will readdress on an annual basis. 5. Colonoscopy 2014. Repeat at their recommended interval. 6. Health maintenance. No routine lab work done as she does this elsewhere. Follow up for bone density otherwise 1 year, sooner as needed.   Anastasio Auerbach MD, 10:57 AM 05/11/2015

## 2015-05-11 NOTE — Patient Instructions (Signed)
Follow up for bone density as scheduled.  You may obtain a copy of any labs that were done today by logging onto MyChart as outlined in the instructions provided with your AVS (after visit summary). The office will not call with normal lab results but certainly if there are any significant abnormalities then we will contact you.   Health Maintenance Adopting a healthy lifestyle and getting preventive care can go a long way to promote health and wellness. Talk with your health care provider about what schedule of regular examinations is right for you. This is a good chance for you to check in with your provider about disease prevention and staying healthy. In between checkups, there are plenty of things you can do on your own. Experts have done a lot of research about which lifestyle changes and preventive measures are most likely to keep you healthy. Ask your health care provider for more information. WEIGHT AND DIET  Eat a healthy diet  Be sure to include plenty of vegetables, fruits, low-fat dairy products, and lean protein.  Do not eat a lot of foods high in solid fats, added sugars, or salt.  Get regular exercise. This is one of the most important things you can do for your health.  Most adults should exercise for at least 150 minutes each week. The exercise should increase your heart rate and make you sweat (moderate-intensity exercise).  Most adults should also do strengthening exercises at least twice a week. This is in addition to the moderate-intensity exercise.  Maintain a healthy weight  Body mass index (BMI) is a measurement that can be used to identify possible weight problems. It estimates body fat based on height and weight. Your health care provider can help determine your BMI and help you achieve or maintain a healthy weight.  For females 69 years of age and older:   A BMI below 18.5 is considered underweight.  A BMI of 18.5 to 24.9 is normal.  A BMI of 25 to 29.9 is  considered overweight.  A BMI of 30 and above is considered obese.  Watch levels of cholesterol and blood lipids  You should start having your blood tested for lipids and cholesterol at 70 years of age, then have this test every 5 years.  You may need to have your cholesterol levels checked more often if:  Your lipid or cholesterol levels are high.  You are older than 70 years of age.  You are at high risk for heart disease.  CANCER SCREENING   Lung Cancer  Lung cancer screening is recommended for adults 30-82 years old who are at high risk for lung cancer because of a history of smoking.  A yearly low-dose CT scan of the lungs is recommended for people who:  Currently smoke.  Have quit within the past 15 years.  Have at least a 30-pack-year history of smoking. A pack year is smoking an average of one pack of cigarettes a day for 1 year.  Yearly screening should continue until it has been 15 years since you quit.  Yearly screening should stop if you develop a health problem that would prevent you from having lung cancer treatment.  Breast Cancer  Practice breast self-awareness. This means understanding how your breasts normally appear and feel.  It also means doing regular breast self-exams. Let your health care provider know about any changes, no matter how small.  If you are in your 20s or 30s, you should have a clinical breast exam (  CBE) by a health care provider every 1-3 years as part of a regular health exam.  If you are 56 or older, have a CBE every year. Also consider having a breast X-ray (mammogram) every year.  If you have a family history of breast cancer, talk to your health care provider about genetic screening.  If you are at high risk for breast cancer, talk to your health care provider about having an MRI and a mammogram every year.  Breast cancer gene (BRCA) assessment is recommended for women who have family members with BRCA-related cancers.  BRCA-related cancers include:  Breast.  Ovarian.  Tubal.  Peritoneal cancers.  Results of the assessment will determine the need for genetic counseling and BRCA1 and BRCA2 testing. Cervical Cancer Routine pelvic examinations to screen for cervical cancer are no longer recommended for nonpregnant women who are considered low risk for cancer of the pelvic organs (ovaries, uterus, and vagina) and who do not have symptoms. A pelvic examination may be necessary if you have symptoms including those associated with pelvic infections. Ask your health care provider if a screening pelvic exam is right for you.   The Pap test is the screening test for cervical cancer for women who are considered at risk.  If you had a hysterectomy for a problem that was not cancer or a condition that could lead to cancer, then you no longer need Pap tests.  If you are older than 65 years, and you have had normal Pap tests for the past 10 years, you no longer need to have Pap tests.  If you have had past treatment for cervical cancer or a condition that could lead to cancer, you need Pap tests and screening for cancer for at least 20 years after your treatment.  If you no longer get a Pap test, assess your risk factors if they change (such as having a new sexual partner). This can affect whether you should start being screened again.  Some women have medical problems that increase their chance of getting cervical cancer. If this is the case for you, your health care provider may recommend more frequent screening and Pap tests.  The human papillomavirus (HPV) test is another test that may be used for cervical cancer screening. The HPV test looks for the virus that can cause cell changes in the cervix. The cells collected during the Pap test can be tested for HPV.  The HPV test can be used to screen women 39 years of age and older. Getting tested for HPV can extend the interval between normal Pap tests from three to  five years.  An HPV test also should be used to screen women of any age who have unclear Pap test results.  After 70 years of age, women should have HPV testing as often as Pap tests.  Colorectal Cancer  This type of cancer can be detected and often prevented.  Routine colorectal cancer screening usually begins at 70 years of age and continues through 70 years of age.  Your health care provider may recommend screening at an earlier age if you have risk factors for colon cancer.  Your health care provider may also recommend using home test kits to check for hidden blood in the stool.  A small camera at the end of a tube can be used to examine your colon directly (sigmoidoscopy or colonoscopy). This is done to check for the earliest forms of colorectal cancer.  Routine screening usually begins at age 77.  Direct examination of the colon should be repeated every 5-10 years through 70 years of age. However, you may need to be screened more often if early forms of precancerous polyps or small growths are found. Skin Cancer  Check your skin from head to toe regularly.  Tell your health care provider about any new moles or changes in moles, especially if there is a change in a mole's shape or color.  Also tell your health care provider if you have a mole that is larger than the size of a pencil eraser.  Always use sunscreen. Apply sunscreen liberally and repeatedly throughout the day.  Protect yourself by wearing long sleeves, pants, a wide-brimmed hat, and sunglasses whenever you are outside. HEART DISEASE, DIABETES, AND HIGH BLOOD PRESSURE   Have your blood pressure checked at least every 1-2 years. High blood pressure causes heart disease and increases the risk of stroke.  If you are between 76 years and 55 years old, ask your health care provider if you should take aspirin to prevent strokes.  Have regular diabetes screenings. This involves taking a blood sample to check your  fasting blood sugar level.  If you are at a normal weight and have a low risk for diabetes, have this test once every three years after 70 years of age.  If you are overweight and have a high risk for diabetes, consider being tested at a younger age or more often. PREVENTING INFECTION  Hepatitis B  If you have a higher risk for hepatitis B, you should be screened for this virus. You are considered at high risk for hepatitis B if:  You were born in a country where hepatitis B is common. Ask your health care provider which countries are considered high risk.  Your parents were born in a high-risk country, and you have not been immunized against hepatitis B (hepatitis B vaccine).  You have HIV or AIDS.  You use needles to inject street drugs.  You live with someone who has hepatitis B.  You have had sex with someone who has hepatitis B.  You get hemodialysis treatment.  You take certain medicines for conditions, including cancer, organ transplantation, and autoimmune conditions. Hepatitis C  Blood testing is recommended for:  Everyone born from [birth year] through 1965.  Anyone with known risk factors for hepatitis C. Sexually transmitted infections (STIs)  You should be screened for sexually transmitted infections (STIs) including gonorrhea and chlamydia if:  You are sexually active and are younger than 70 years of age.  You are older than 70 years of age and your health care provider tells you that you are at risk for this type of infection.  Your sexual activity has changed since you were last screened and you are at an increased risk for chlamydia or gonorrhea. Ask your health care provider if you are at risk.  If you do not have HIV, but are at risk, it may be recommended that you take a prescription medicine daily to prevent HIV infection. This is called pre-exposure prophylaxis (PrEP). You are considered at risk if:  You are sexually active and do not regularly use condoms or  know the HIV status of your partner(s).  You take drugs by injection.  You are sexually active with a partner who has HIV. Talk with your health care provider about whether you are at high risk of being infected with HIV. If you choose to begin PrEP, you should first be tested for HIV. You should then be  tested every 3 months for as long as you are taking PrEP.  PREGNANCY   If you are premenopausal and you may become pregnant, ask your health care provider about preconception counseling.  If you may become pregnant, take 400 to 800 micrograms (mcg) of folic acid every day.  If you want to prevent pregnancy, talk to your health care provider about birth control (contraception). OSTEOPOROSIS AND MENOPAUSE   Osteoporosis is a disease in which the bones lose minerals and strength with aging. This can result in serious bone fractures. Your risk for osteoporosis can be identified using a bone density scan.  If you are 16 years of age or older, or if you are at risk for osteoporosis and fractures, ask your health care provider if you should be screened.  Ask your health care provider whether you should take a calcium or vitamin D supplement to lower your risk for osteoporosis.  Menopause may have certain physical symptoms and risks.  Hormone replacement therapy may reduce some of these symptoms and risks. Talk to your health care provider about whether hormone replacement therapy is right for you.  HOME CARE INSTRUCTIONS   Schedule regular health, dental, and eye exams.  Stay current with your immunizations.   Do not use any tobacco products including cigarettes, chewing tobacco, or electronic cigarettes.  If you are pregnant, do not drink alcohol.  If you are breastfeeding, limit how much and how often you drink alcohol.  Limit alcohol intake to no more than 1 drink per day for nonpregnant women. One drink equals 12 ounces of beer, 5 ounces of wine, or 1 ounces of hard liquor.  Do  not use street drugs.  Do not share needles.  Ask your health care provider for help if you need support or information about quitting drugs.  Tell your health care provider if you often feel depressed.  Tell your health care provider if you have ever been abused or do not feel safe at home. Document Released: 07/23/2010 Document Revised: 05/24/2013 Document Reviewed: 12/09/2012 [facility] Patient Information 2015 [place], [place]. This information is not intended to replace advice given to you by your health care provider. Make sure you discuss any questions you have with your health care provider.

## 2015-05-22 DIAGNOSIS — M8580 Other specified disorders of bone density and structure, unspecified site: Secondary | ICD-10-CM

## 2015-05-22 HISTORY — DX: Other specified disorders of bone density and structure, unspecified site: M85.80

## 2015-06-08 ENCOUNTER — Telehealth: Payer: Self-pay | Admitting: *Deleted

## 2015-06-08 DIAGNOSIS — M8580 Other specified disorders of bone density and structure, unspecified site: Secondary | ICD-10-CM

## 2015-06-08 DIAGNOSIS — Z780 Asymptomatic menopausal state: Secondary | ICD-10-CM

## 2015-06-08 NOTE — Telephone Encounter (Signed)
Bethena Roys from [facility] called regarding the done density order pt is scheduled on 06/12/15. Bethena Roys said that medicare will not cover with the follow diagnosis osteopenia only will also need to say postmenopausal. New order placed

## 2015-06-12 ENCOUNTER — Other Ambulatory Visit (HOSPITAL_COMMUNITY): Payer: Medicare Other

## 2015-06-12 ENCOUNTER — Ambulatory Visit (HOSPITAL_COMMUNITY)
Admission: RE | Admit: 2015-06-12 | Discharge: 2015-06-12 | Disposition: A | Discharge status: Home / Self Care | Payer: Medicare Other | Source: Ambulatory Visit | Attending: Gynecology | Admitting: Gynecology

## 2015-06-12 DIAGNOSIS — M85851 Other specified disorders of bone density and structure, right thigh: Secondary | ICD-10-CM | POA: Diagnosis not present

## 2015-06-12 DIAGNOSIS — Z780 Asymptomatic menopausal state: Secondary | ICD-10-CM | POA: Diagnosis present

## 2015-06-12 DIAGNOSIS — M8580 Other specified disorders of bone density and structure, unspecified site: Secondary | ICD-10-CM | POA: Diagnosis present

## 2015-06-20 ENCOUNTER — Encounter (HOSPITAL_COMMUNITY): Payer: Self-pay

## 2015-07-12 ENCOUNTER — Ambulatory Visit (HOSPITAL_COMMUNITY): Payer: Medicare Other | Attending: Internal Medicine

## 2015-07-12 DIAGNOSIS — M6281 Muscle weakness (generalized): Secondary | ICD-10-CM | POA: Diagnosis present

## 2015-07-12 DIAGNOSIS — R2681 Unsteadiness on feet: Secondary | ICD-10-CM | POA: Diagnosis present

## 2015-07-12 DIAGNOSIS — M79605 Pain in left leg: Secondary | ICD-10-CM | POA: Insufficient documentation

## 2015-07-12 NOTE — Therapy (Signed)
[facility], [place], [address] Phone: [phone]   Fax:  [phone]  Physical Therapy Evaluation  Patient Details  Name: Emma Henderson MRN: [id number] Date of Birth: [birth year] No Data Recorded  Encounter Date: 07/12/2015      PT End of Session - 07/12/15 1548    Visit Number 1   Number of Visits 8   Date for PT Re-Evaluation 07/26/15   Authorization Type [employer] Medicare Advantage State Plan PPO   Authorization Time Period 07/12/2015 through 08/09/2015   PT Start Time 1348   PT Stop Time 1432   PT Time Calculation (min) 44 min   Activity Tolerance Patient tolerated treatment well   Behavior During Therapy [facility] for tasks assessed/performed      Past Medical History  Diagnosis Date  . Diverticulosis 2006  . Perforated diverticulitis 07/04/2012  . Osteopenia 05/2015    T score -1.6 FRAX 10%/1.6% stable from prior DEXA  . Umbilical hernia [id number]  . Cataract   . Unsteady gait   . [id number])     "probably monthly" 11/04/2012)  . Arthritis     "hands" (11/04/2012)  . Urinary incontinence     Past Surgical History  Procedure Laterality Date  . Cervical spine surgery  ~ 1997  . Lumbar spine surgery  ~ 1987  . Laparoscopic sigmoid colectomy  11/04/2012    ABSCESS DIVERTICULAR     . Colon surgery    . Hernia repair  11/04/2012  . Laparoscopic partial colectomy N/A 11/04/2012    Procedure: LAPAROSCOPIC SIGMOID COLECTOMY;  Surgeon: Harl Bowie, MD;  Location: [facility];  Service: General;  Laterality: N/A;  . Proctoplasty N/A 11/04/2012    Procedure: RIGID PROCTOSCOPY;  Surgeon: Harl Bowie, MD;  Location: [facility];  Service: General;  Laterality: N/A;    There were no vitals filed for this visit.       Subjective Assessment - 07/12/15 1351    Subjective Pt stats that she is off balance.  Youngest Dtr is an SLP, states that she leans fwd.  Pt states that she is very active and she walks alot  with L LE burning when she walks alot,  numbness in L lateral toes.  Reports a fall  April this year tripped on a curb.  Trips on L foot - notices it happens when there is uneven surfaces.     Pertinent History lumber and cervical back surgeries, colon surgery   How long can you sit comfortably? 2 -3 hours.    How long can you stand comfortably? "Not very long" - not at all comfortablly.    How long can you walk comfortably? 2-3 miles and then L LE starts burning.    Diagnostic tests Brain MRI 3 years ago (-).   Patient Stated Goals Pt wants to be able to stand comfortably for as long as she wants to.   Currently in Pain? No/denies            [facility] PT Assessment - 07/12/15 0001    AROM   Lumbar Flexion 110   Lumbar Extension 24   Lumbar - Right Side Bend finger tips past knee joint   Lumbar - Left Side Bend fingertips to mid joint line of the knee.    Strength   Right Hip Flexion 4/5   Right Hip Extension 4/5   Right Hip ABduction 4/5   Left Hip Flexion 4/5   Left Hip Extension 3+/5  Left Hip ABduction 3+/5   Right Knee Flexion 4+/5   Right Knee Extension 5/5   Left Knee Flexion 4/5   Left Knee Extension 5/5   Right Ankle Dorsiflexion 5/5   Left Ankle Dorsiflexion 5/5   6 minute walk test results    Endurance additional comments 3MWT: 1073.5   Standardized Balance Assessment   Standardized Balance Assessment Berg Balance Test   Berg Balance Test   Sit to Stand Able to stand without using hands and stabilize independently   Standing Unsupported Able to stand safely 2 minutes   Sitting with Back Unsupported but Feet Supported on Floor or Stool Able to sit safely and securely 2 minutes   Stand to Sit Sits safely with minimal use of hands   Transfers Able to transfer safely, minor use of hands   Standing Unsupported with Eyes Closed Able to stand 10 seconds safely   Standing Ubsupported with Feet Together Able to place feet together independently and stand for 1 minute with  supervision   From Standing, Reach Forward with Outstretched Arm Can reach confidently >25 cm (10")   From Standing Position, Pick up Object from Floor Able to pick up shoe safely and easily   From Standing Position, Turn to Look Behind Over each Shoulder Looks behind from both sides and weight shifts well   Turn 360 Degrees Able to turn 360 degrees safely but slowly   Standing Unsupported, Alternately Place Feet on Step/Stool Able to complete 4 steps without aid or supervision   Standing Unsupported, One Foot in Front Able to plae foot ahead of the other independently and hold 30 seconds   Standing on One Leg Able to lift leg independently and hold 5-10 seconds   Total Score 49                   [facility] Adult PT Treatment/Exercise - 07/12/15 0001    Lumbar Exercises: Quadruped   Madcat/Old Horse 5 reps   Single Arm Raise Right;Left;5 reps   Straight Leg Raise 5 reps;Limitations  Pt unable to stabilize to prevent tilting either direction.    Knee/Hip Exercises: Sidelying   Clams 2x10 reps bilaterally                PT Education - 07/12/15 1547    Education provided Yes   Education Details Educated pt on POC, importance of HEP, as well as good posture and how it's important to decrease pain and improve balance.    Person(s) Educated Patient   Methods Explanation;Handout   Comprehension Verbalized understanding          PT Short Term Goals - 07/12/15 1600    PT SHORT TERM GOAL #1   Title Pt will demonstrate awareness of forward flexed posture and be able to self correct 80% of the time to improve biomechanics and endurance with sitting and standing tolerance.    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    Time 2   Period Weeks   Status New   PT SHORT  TERM GOAL #3   Title Pt will correctly and consistantly demonstrate ability to perform HEP as instructed to improve strength and flexibity and promote improved quality  of life.     Time 2   Period Weeks   Status New           PT Long Term Goals - 07/12/15 1604    PT LONG TERM GOAL #1   Title Pt will express 0/10 pain in L LE with all functional activities including standing, walking and sitting to improve quality of life.    Time 4   Period Weeks   Status New   PT LONG TERM GOAL #2   Title Pt will report being able to stand for at least 30 min with no pain in L LE to demonstrate improved postural strength and endurance required to complete choir production.    Time 4   Period Weeks   Status New   PT LONG TERM GOAL #3   Title Pt will demonstrate improved muscle strength in all areas of deficit by at least 1 MMT to improve posture and biomechanics required for optimal performance of functional mobiltiy.    Time 4   Period Weeks   Status New   PT LONG TERM GOAL #4   Title Pt will demonstrate improvement on BERG balance test from 49/56 to at least a 53/56 to demonstrate improved balance and decreased risk of falling.    Time 4   Period Weeks   Status New               Plan - 07/12/15 1550    Clinical Impression Statement Pt presents today with c/o decreased balance, as well as pain in her L LE.  She expresses that she has a decreased standing tolerance, and after walking for a long period of time she develops pain in her L LE.  Recently her family and friends have told her that she leans forward when she is standing.  She does state that she has had lumbar surgery, as well as colon surgery in the past, which may be contributing factors.  Pt also reports that she had 1 fall in Aprl when she was coming out from a doctor's appointment, and fell going down the curb.  At this point, she demonstrates decreased strength in B hip extensors, as well as B hip Abductors with L LE being  weaker than the right.  Pt also demonstrates a chronic fwd flexed posture with rounded shoulders, and flattened lumbar lordosis.  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    Rehab Potential Excellent   Clinical Impairments Affecting Rehab Potential Previoius lumbar and colon surgeries.    PT Frequency 2x / week   PT Duration 4 weeks   PT Treatment/Interventions Biofeedback;Moist Heat;Electrical Stimulation;Cryotherapy;Gait training;Functional mobility training;Therapeutic activities;Stair training;Therapeutic exercise;Balance training;Patient/family education;Manual techniques;Energy conservation;Taping;Passive range of motion   PT Next Visit Plan Review POC, and HEP.  Assess spinal joint mobility, progress with standing functional hip abductor strengthening.    PT Home Exercise Plan Cat/Camel, Clams, Quadruped with Alternating UE's, Pec stretch - corner   Recommended Other Services none   Consulted and Agree with Plan of Care Patient      Patient will benefit from skilled therapeutic intervention in order to improve the following deficits and impairments:  Decreased balance, Decreased activity tolerance, Decreased endurance, Decreased mobility, Decreased range of motion, Decreased strength, Hypomobility, Difficulty walking, Increased fascial restricitons, Impaired perceived functional ability, Impaired flexibility, Improper body mechanics, Postural dysfunction, Pain  Visit Diagnosis: Pain In Left Leg - Plan: PT plan of care cert/re-cert  Muscle weakness (generalized) - Plan: PT plan of care cert/re-cert  Unsteadiness on feet - Plan: PT plan of care cert/re-cert      G-Codes - [id number]    Functional Assessment Tool Used BERG, 3MWT, MMT and Clinical Judgement.    Functional Limitation Mobility: Walking and moving around   Mobility:  Walking and Moving Around Current Status [phone]) At least 1 percent but less than 20 percent impaired, limited or restricted   Mobility: Walking and Moving Around Goal Status [phone]) 0 percent impaired, limited or restricted       Problem List Patient Active Problem List   Diagnosis Date Noted  . Perforated diverticulitis 07/04/2012  . Osteopenia 07/04/2012  . Umbilical hernia 0000000    Beth Keiden Deskin, PT, DPT X: [phone]    [facility] [address] [place], [place], [address] Phone: [phone]   Fax:  [phone]  Name: KEISHANA LICKTEIG MRN: [id number] Date of Birth: July 02, 1945

## 2015-07-12 NOTE — Patient Instructions (Signed)
  QUADRUPED ALTERNATE ARM   While in a crawling position, slowly raise up an arm out in front of you.   2x10 reps, twice a day   CLAM SHELLS  While lying on your side with your knees bent, draw up the top knee while keeping contact of your feet together.  Do not let your pelvis roll back during the lifting movement.       Both legs.  2x10 reps, twice a day    CAT AND CAMEL  While on your hands and knees in a crawl position, raise up your back and arch it towards the ceiling.  Next return to a lowered position and arch your back the opposite direction. 2x10 reps hold 5 seconds, twice a day    CORNER STRECH - W  While standing at a corner of a wall, place your arms on the walls in the shape of a "W" so that your elbows are bent and pointed towards the ground as shown. Take one step forward towards the corner. Bend your front knee until a stretch is felt along the front of your chest and/or shoulders. Your arms should be pointed downward towards the ground.  NOTE: Your legs should control the stretch by bending or straightening your front knee.     2x30 seconds, twice a day

## 2015-07-26 ENCOUNTER — Ambulatory Visit (HOSPITAL_COMMUNITY): Payer: Medicare Other | Attending: Internal Medicine | Admitting: Physical Therapy

## 2015-07-26 DIAGNOSIS — M79605 Pain in left leg: Secondary | ICD-10-CM

## 2015-07-26 DIAGNOSIS — R2681 Unsteadiness on feet: Secondary | ICD-10-CM | POA: Diagnosis present

## 2015-07-26 DIAGNOSIS — M6281 Muscle weakness (generalized): Secondary | ICD-10-CM | POA: Diagnosis present

## 2015-07-26 NOTE — Therapy (Signed)
[place], [place], [address] Phone: [phone]   Fax:  [phone]  Physical Therapy Treatment  Patient Details  Name: Emma Henderson MRN: [id number] Date of Birth: March 03, 1945 No Data Recorded  Encounter Date: 07/26/2015      PT End of Session - 07/26/15 1705    Visit Number 2   Number of Visits 8   Date for PT Re-Evaluation 07/26/15   Authorization Type [employer] [place] P[employer]   Authorization Time Period 07/12/2015 through 08/09/2015   PT Start Time 1602   PT Stop Time 1644   PT Time Calculation (min) 42 min   Activity Tolerance Patient tolerated treatment well   Behavior During Therapy [facility] for tasks assessed/performed      Past Medical History  Diagnosis Date  . Diverticulosis 2006  . Perforated diverticulitis 07/04/2012  . Osteopenia 05/2015    T score -1.6 FRAX 10%/1.6% stable from prior DEXA  . Umbilical hernia [id number]  . Cataract   . Unsteady gait   . [id number]     "probably monthly" 11/04/2012)  . Arthritis     "hands" (11/04/2012)  . Urinary incontinence     Past Surgical History  Procedure Laterality Date  . Cervical spine surgery  ~ 1997  . Lumbar spine surgery  ~ 1987  . Laparoscopic sigmoid colectomy  11/04/2012    ABSCESS DIVERTICULAR     . Colon surgery    . Hernia repair  11/04/2012  . Laparoscopic partial colectomy N/A 11/04/2012    Procedure: LAPAROSCOPIC SIGMOID COLECTOMY;  Surgeon: Harl Bowie, MD;  Location: [place];  Service: General;  Laterality: N/A;  . Proctoplasty N/A 11/04/2012    Procedure: RIGID PROCTOSCOPY;  Surgeon: Harl Bowie, MD;  Location: [place];  Service: General;  Laterality: N/A;    There were no vitals filed for this visit.      Subjective Assessment - 07/26/15 1605    Subjective Patient reports taht she is doing well today besides having a cold today; it is hard for her to stand still well. No falls since last time she  came.    Currently in Pain? No/denies                         [facility] Adult PT Treatment/Exercise - 07/26/15 0001    Lumbar Exercises: Stretches   Active Hamstring Stretch 3 reps;30 seconds   Active Hamstring Stretch Limitations stairs    Passive Hamstring Stretch 3 reps;30 seconds   Passive Hamstring Stretch Limitations slantboard (gastroc)   Piriformis Stretch 2 reps;30 seconds   Piriformis Stretch Limitations seated    Lumbar Exercises: Supine   Ab Set 10 reps;5 seconds   Knee/Hip Exercises: Standing   Lateral Step Up Both;1 set;10 reps   Lateral Step Up Limitations 6 inch box    Forward Step Up Both;1 set;10 reps   Forward Step Up Limitations 6 inch box    Functional Squat 10 reps   Functional Squat Limitations swiss ball assist    Knee/Hip Exercises: Supine   Bridges Limitations 1x10  2 second holds    Straight Leg Raises Both;1 set;10 reps   Other Supine Knee/Hip Exercises supine with red TB 2x10             Balance Exercises - 07/26/15 1704    Balance Exercises: Standing   Standing Eyes Closed Narrow base of support (BOS);Foam/compliant surface;3 reps  Tandem Stance Eyes open;3 reps;10 secs   SLS 3 reps;10 secs;Solid surface           PT Education - 07/26/15 1704    Education provided Yes   Education Details reviewed initial eval and goals    Person(s) Educated Patient   Methods Explanation   Comprehension Verbalized understanding          PT Short Term Goals - 07/12/15 1600    PT SHORT TERM GOAL #1   Title Pt will demonstrate awareness of forward flexed posture and be able to self correct 80% of the time to improve biomechanics and endurance with sitting and standing tolerance.    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     Time 2   Period Weeks   Status New   PT SHORT TERM GOAL #3   Title Pt will correctly and consistantly demonstrate ability to perform HEP as instructed to improve strength and flexibity and promote improved quality of life.     Time 2   Period Weeks   Status New           PT Long Term Goals - 07/12/15 1604    PT LONG TERM GOAL #1   Title Pt will express 0/10 pain in L LE with all functional activities including standing, walking and sitting to improve quality of life.    Time 4   Period Weeks   Status New   PT LONG TERM GOAL #2   Title Pt will report being able to stand for at least 30 min with no pain in L LE to demonstrate improved postural strength and endurance required to complete choir production.    Time 4   Period Weeks   Status New   PT LONG TERM GOAL #3   Title Pt will demonstrate improved muscle strength in all areas of deficit by at least 1 MMT to improve posture and biomechanics required for optimal performance of functional mobiltiy.    Time 4   Period Weeks   Status New   PT LONG TERM GOAL #4   Title Pt will demonstrate improvement on BERG balance test from 49/56 to at least a 53/56 to demonstrate improved balance and decreased risk of falling.    Time 4   Period Weeks   Status New               Plan - 07/26/15 1705    Clinical Impression Statement Performed functional stretching as well as functional strengthening and balance training today. Noted fatigue during functional exercises today especially during CKC exercises with L LE led exercises in particular, as well as some difficulty with balance with step ups. Also noted significant difficulty with pure balance exercises, noted quite a bit of ankle strategy during most tasks that resulted in muscle fatigue. When ability to use ankles for balance was removed, patient compensates with excessive hip strategy. Reviewed initial eval and goals at end of session.    Rehab Potential Excellent   Clinical  Impairments Affecting Rehab Potential Previoius lumbar and colon surgeries.    PT Frequency 2x / week   PT Duration 4 weeks   PT Treatment/Interventions Biofeedback;Moist Heat;Electrical Stimulation;Cryotherapy;Gait training;Functional mobility training;Therapeutic  activities;Stair training;Therapeutic exercise;Balance training;Patient/family education;Manual techniques;Energy conservation;Taping;Passive range of motion   PT Next Visit Plan   Assess spinal joint mobility, progress with standing functional hip abductor strengthening.    Consulted and Agree with Plan of Care Patient      Patient will benefit from skilled therapeutic intervention in order to improve the following deficits and impairments:  Decreased balance, Decreased activity tolerance, Decreased endurance, Decreased mobility, Decreased range of motion, Decreased strength, Hypomobility, Difficulty walking, Increased fascial restricitons, Impaired perceived functional ability, Impaired flexibility, Improper body mechanics, Postural dysfunction, Pain  Visit Diagnosis: Pain In Left Leg  Muscle weakness (generalized)  Unsteadiness on feet     Problem List Patient Active Problem List   Diagnosis Date Noted  . Perforated diverticulitis 07/04/2012  . Osteopenia 07/04/2012  . Umbilical hernia 0000000    Deniece Ree PT, DPT [place] [address] [place], [place], [address] Phone: [phone]   Fax:  [phone]  Name: Emma Henderson MRN: [id number] Date of Birth: 1945-02-08

## 2015-07-27 ENCOUNTER — Ambulatory Visit (HOSPITAL_COMMUNITY): Payer: Medicare Other | Admitting: Physical Therapy

## 2015-07-27 DIAGNOSIS — M6281 Muscle weakness (generalized): Secondary | ICD-10-CM

## 2015-07-27 DIAGNOSIS — R2681 Unsteadiness on feet: Secondary | ICD-10-CM

## 2015-07-27 DIAGNOSIS — M79605 Pain in left leg: Secondary | ICD-10-CM | POA: Diagnosis not present

## 2015-07-27 NOTE — Patient Instructions (Signed)
   Theraband Ankle 4 Ways  4 different motions for ankle stability:  1. Plantar Flexion: Put theraband under foot and straighten your knee. Then, pump your ankle up and down towards the ground (similar to a gas pedal).  2. Inversion: While seated with a ball between knees, use an elastic band attached to your foot and draw your foot inward. Be sure to keep your heel in contact with the floor the entire time.  3. Eversion: While seated with a ball between knees, use an elastic band attached to your foot and draw your foot outward to the side. Be sure to keep your heel in contact with the floor the entire time.  4. While seated with a ball between knees, place the elastic band over the ball of the affected foot. Step on the band with the other foot and lift the involved toes and foot up.  Repeat 10-15 times, twice a day.

## 2015-07-27 NOTE — Therapy (Signed)
[place], [place], [address] Phone: [phone]   Fax:  [phone]  Physical Therapy Treatment  Patient Details  Name: Emma Henderson MRN: [id number] Date of Birth: April 02, 1945 No Data Recorded  Encounter Date: 07/27/2015      PT End of Session - 07/27/15 1702    Visit Number 3   Number of Visits 8   Date for PT Re-Evaluation 08/10/15   Authorization Type [employer] PPO   Authorization Time Period 07/12/2015 through 08/09/2015   PT Start Time 1600   PT Stop Time 1654   PT Time Calculation (min) 54 min   Activity Tolerance Patient tolerated treatment well   Behavior During Therapy [facility] for tasks assessed/performed      Past Medical History  Diagnosis Date  . Diverticulosis 2006  . Perforated diverticulitis 07/04/2012  . Osteopenia 05/2015    T score -1.6 FRAX 10%/1.6% stable from prior DEXA  . Umbilical hernia [id number]  . Cataract   . Unsteady gait   . [id number])     "probably monthly" 11/04/2012)  . Arthritis     "hands" (11/04/2012)  . Urinary incontinence     Past Surgical History  Procedure Laterality Date  . Cervical spine surgery  ~ 1997  . Lumbar spine surgery  ~ 1987  . Laparoscopic sigmoid colectomy  11/04/2012    ABSCESS DIVERTICULAR     . Colon surgery    . Hernia repair  11/04/2012  . Laparoscopic partial colectomy N/A 11/04/2012    Procedure: LAPAROSCOPIC SIGMOID COLECTOMY;  Surgeon: Harl Bowie, MD;  Location: [place];  Service: General;  Laterality: N/A;  . Proctoplasty N/A 11/04/2012    Procedure: RIGID PROCTOSCOPY;  Surgeon: Harl Bowie, MD;  Location: [place];  Service: General;  Laterality: N/A;    There were no vitals filed for this visit.      Subjective Assessment - 07/27/15 1603    Subjective Patient arrives today reporting that she is doing well, feels much better from her cold and was not sore after yesterday's session. Went to  yoga and walked a mile already today.    Pertinent History lumber and cervical back surgeries, colon surgery   Currently in Pain? No/denies                         [facility] Adult PT Treatment/Exercise - 07/27/15 0001    Lumbar Exercises: Stretches   Active Hamstring Stretch 3 reps;30 seconds   Active Hamstring Stretch Limitations 12 inch box    Passive Hamstring Stretch 3 reps;30 seconds   Passive Hamstring Stretch Limitations slantboard (gastroc)   Piriformis Stretch 2 reps;30 seconds   Piriformis Stretch Limitations seated    Knee/Hip Exercises: Stretches   Hip Flexor Stretch Both;2 reps;30 seconds   Hip Flexor Stretch Limitations thomas position    Knee/Hip Exercises: Standing   Lateral Step Up Both;1 set;15 reps   Lateral Step Up Limitations 6 inch    Forward Step Up Both;1 set;15 reps   Forward Step Up Limitations 6 inch box    Functional Squat 15 reps   Functional Squat Limitations swiss ball    Other Standing Knee Exercises frequent discussion of importance of posture during functional exercise and balance activiteis    Knee/Hip Exercises: Supine   Other Supine Knee/Hip Exercises ankle dorsiflexion and inversion 1x20 with red TB (waived other 2 directions due to time)  Balance Exercises - 07/27/15 1629    Balance Exercises: Standing   Rockerboard Anterior/posterior;Lateral;Intermittent UE support  2 minutes each    Tandem Gait 4 reps;Other (comment)  30ft    Retro Gait 4 reps;Other (comment)  tandem gait 54ft            PT Education - 07/27/15 1701    Education provided Yes   Education Details importance and impact of correct posture on balance; 4 way ankle exercises with red TB for HEP    Person(s) Educated Patient   Methods Explanation   Comprehension Verbalized understanding          PT Short Term Goals - 07/12/15 1600    PT SHORT TERM GOAL #1   Title Pt will demonstrate awareness of forward flexed posture and be able to  self correct 80% of the time to improve biomechanics and endurance with sitting and standing tolerance.    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    Time 2   Period Weeks   Status New   PT SHORT TERM GOAL #3   Title Pt will correctly and consistantly demonstrate ability to perform HEP as instructed to improve strength and flexibity and promote improved quality of life.     Time 2   Period Weeks   Status New           PT Long Term Goals - 07/12/15 1604    PT LONG TERM GOAL #1   Title Pt will express 0/10 pain in L LE with all functional activities including standing, walking and sitting to improve quality of life.    Time 4   Period Weeks   Status New   PT LONG TERM GOAL #2   Title Pt will report being able to stand for at least 30 min with no pain in L LE to demonstrate improved postural strength and endurance required to complete choir production.    Time 4   Period Weeks   Status New   PT LONG TERM GOAL #3   Title Pt will demonstrate improved muscle strength in all areas of deficit by at least 1 MMT to improve posture and biomechanics required for optimal performance of functional mobiltiy.    Time 4   Period Weeks   Status New   PT LONG TERM GOAL #4   Title Pt will demonstrate improvement on BERG balance test from 49/56 to at least a 53/56 to demonstrate improved balance and decreased risk of falling.    Time 4   Period Weeks   Status New               Plan - 07/27/15 1702    Clinical Impression Statement Continued with functional stretching at beginning of session, and also introduced Thomas hip flexor stretch today as patient tends to stand with trunk flexed at hips; did apply overpressure to stretch as well for maximum benefit. Also introduced 4 way  ankle strengthening with red TB today to assist in improving ankle strength and overall balance today, also assigned this as HEP to continue working on this as patient does demonstrate some ankle hypermobility which may also be impacting her balance. Otherwise continued with functional strengthening  and balance training today, noting significant improvement in form and steadiness with core activation and correction of posture in general.    Rehab Potential Excellent   Clinical Impairments Affecting Rehab Potential Previoius lumbar and colon surgeries.    PT Frequency 2x / week   PT Duration 4 weeks   PT Treatment/Interventions Biofeedback;Moist Heat;Electrical Stimulation;Cryotherapy;Gait training;Functional mobility training;Therapeutic activities;Stair training;Therapeutic exercise;Balance training;Patient/family education;Manual techniques;Energy conservation;Taping;Passive range of motion   PT Next Visit Plan continue ankle stability/strengthening, postural training, hip flexor stretches; functional strength and balance, core training    PT Home Exercise Plan updated with 4 way ankle exercise    Consulted and Agree with Plan of Care Patient      Patient will benefit from skilled therapeutic intervention in order to improve the following deficits and impairments:  Decreased balance, Decreased activity tolerance, Decreased endurance, Decreased mobility, Decreased range of motion, Decreased strength, Hypomobility, Difficulty walking, Increased fascial restricitons, Impaired perceived functional ability, Impaired flexibility, Improper body mechanics, Postural dysfunction, Pain  Visit Diagnosis: Pain In Left Leg  Muscle weakness (generalized)  Unsteadiness on feet     Problem List Patient Active Problem List   Diagnosis Date Noted  . Perforated diverticulitis 07/04/2012  . Osteopenia 07/04/2012  . Umbilical hernia 0000000    Deniece Ree PT, DPT [place] [address] [place], [place], [address] Phone: [phone]   Fax:  [phone]  Name: HANADI GALANO MRN: [id number] Date of Birth: [birth year]

## 2015-07-31 ENCOUNTER — Ambulatory Visit (HOSPITAL_COMMUNITY): Payer: Medicare Other | Admitting: Physical Therapy

## 2015-07-31 DIAGNOSIS — R2681 Unsteadiness on feet: Secondary | ICD-10-CM

## 2015-07-31 DIAGNOSIS — M79605 Pain in left leg: Secondary | ICD-10-CM

## 2015-07-31 DIAGNOSIS — M6281 Muscle weakness (generalized): Secondary | ICD-10-CM

## 2015-07-31 NOTE — Therapy (Signed)
[place], [place], [address] Phone: [phone]   Fax:  [phone]  Physical Therapy Treatment  Patient Details  Name: Emma Henderson MRN: [id number] Date of Birth: 12-30-1945 No Data Recorded  Encounter Date: 07/31/2015      PT End of Session - 07/31/15 0833    Visit Number 4   Number of Visits 8   Date for PT Re-Evaluation 08/10/15   Authorization Type [employer]age [employer] PPO   Authorization Time Period 07/12/2015 through 08/09/2015   PT Start Time 0816   PT Stop Time 0900   PT Time Calculation (min) 44 min   Activity Tolerance Patient tolerated treatment well   Behavior During Therapy [facility] for tasks assessed/performed      Past Medical History  Diagnosis Date  . Diverticulosis 2006  . Perforated diverticulitis 07/04/2012  . Osteopenia 05/2015    T score -1.6 FRAX 10%/1.6% stable from prior DEXA  . Umbilical hernia [id number]  . Cataract   . Unsteady gait   . [id number])     "probably monthly" 11/04/2012)  . Arthritis     "hands" (11/04/2012)  . Urinary incontinence     Past Surgical History  Procedure Laterality Date  . Cervical spine surgery  ~ 1997  . Lumbar spine surgery  ~ 1987  . Laparoscopic sigmoid colectomy  11/04/2012    ABSCESS DIVERTICULAR     . Colon surgery    . Hernia repair  11/04/2012  . Laparoscopic partial colectomy N/A 11/04/2012    Procedure: LAPAROSCOPIC SIGMOID COLECTOMY;  Surgeon: Emma Bowie, MD;  Location: [place];  Service: General;  Laterality: N/A;  . Proctoplasty N/A 11/04/2012    Procedure: RIGID PROCTOSCOPY;  Surgeon: Emma Bowie, MD;  Location: [place];  Service: General;  Laterality: N/A;    There were no vitals filed for this visit.      Subjective Assessment - 07/31/15 0819    Subjective Pt reports she feels things are getting better. She is not sure if she is performing all of her exercises correctly.    Currently in Pain?  No/denies                         [facility] Adult PT Treatment/Exercise - 07/31/15 0001    Exercises   Exercises Other Exercises   Other Exercises  supine hip flexor stretch 3x30 sec each   Lumbar Exercises: Supine   Bridge 15 reps   Bridge Limitations x2 reps with red TB around knees    Knee/Hip Exercises: Standing   Heel Raises 20 reps   Heel Raises Limitations toe raises   Forward Step Up Both;1 set;15 reps;Step Height: 6";Hand Hold: 1   Forward Step Up Limitations 6 inch box, x10 reps each without UE support    Knee/Hip Exercises: Sidelying   Hip ADduction Both;1 set   Hip ADduction Limitations against wall              Balance Exercises - 07/31/15 0845    Balance Exercises: Standing   Standing Eyes Opened Narrow base of support (BOS);Foam/compliant surface;2 reps;20 secs  15-30 sec max   Rockerboard Anterior/posterior;Lateral  2 finger support, x2 min each   Tandem Gait 2 reps   Retro Gait 2 reps           PT Education - 07/31/15 [phone]    Education provided Yes   Education Details reviewed HEP technique  Person(s) Educated Patient   Methods Explanation;Demonstration   Comprehension Verbalized understanding;Returned demonstration          PT Short Term Goals - 07/12/15 1600    PT SHORT TERM GOAL #1   Title Pt will demonstrate awareness of forward flexed posture and be able to self correct 80% of the time to improve biomechanics and endurance with sitting and standing tolerance.    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    Time 2   Period Weeks   Status New   PT SHORT TERM GOAL #3   Title Pt will correctly and consistantly demonstrate ability to perform HEP as instructed to improve strength and flexibity and promote improved  quality of life.     Time 2   Period Weeks   Status New           PT Long Term Goals - 07/12/15 1604    PT LONG TERM GOAL #1   Title Pt will express 0/10 pain in L LE with all functional activities including standing, walking and sitting to improve quality of life.    Time 4   Period Weeks   Status New   PT LONG TERM GOAL #2   Title Pt will report being able to stand for at least 30 min with no pain in L LE to demonstrate improved postural strength and endurance required to complete choir production.    Time 4   Period Weeks   Status New   PT LONG TERM GOAL #3   Title Pt will demonstrate improved muscle strength in all areas of deficit by at least 1 MMT to improve posture and biomechanics required for optimal performance of functional mobiltiy.    Time 4   Period Weeks   Status New   PT LONG TERM GOAL #4   Title Pt will demonstrate improvement on BERG balance test from 49/56 to at least a 53/56 to demonstrate improved balance and decreased risk of falling.    Time 4   Period Weeks   Status New               Plan - 07/31/15 0834    Clinical Impression Statement Today's session continued focus on LE strength and balance activity. Pt demonstrating improved tolerance to increased reps and resistance without complain of pain or fatigue. Reviewed HEP technique with pt able to return correct demonstration with minimal verbal cues. Will continue with current POC.    Rehab Potential Excellent   Clinical Impairments Affecting Rehab Potential Previoius lumbar and colon surgeries.    PT Frequency 2x / week   PT Duration 4 weeks   PT Treatment/Interventions Biofeedback;Moist Heat;Electrical Stimulation;Cryotherapy;Gait training;Functional mobility training;Therapeutic activities;Stair training;Therapeutic exercise;Balance training;Patient/family education;Manual techniques;Energy conservation;Taping;Passive range of motion   PT Next Visit Plan continue ankle  stability/strengthening, postural training, hip flexor stretches; functional strength and balance, core training    PT Home Exercise Plan no updates this visit   Consulted and Agree with Plan of Care Patient      Patient will benefit from skilled therapeutic intervention in order to improve the following deficits and impairments:  Decreased balance, Decreased activity tolerance, Decreased endurance, Decreased mobility, Decreased range of motion, Decreased  strength, Hypomobility, Difficulty walking, Increased fascial restricitons, Impaired perceived functional ability, Impaired flexibility, Improper body mechanics, Postural dysfunction, Pain  Visit Diagnosis: Pain In Left Leg  Muscle weakness (generalized)  Unsteadiness on feet     Problem List Patient Active Problem List   Diagnosis Date Noted  . Perforated diverticulitis 07/04/2012  . Osteopenia 07/04/2012  . Umbilical hernia 0000000    9:00 AM,07/31/2015 Elly Modena PT, DPT [facility] [place] [address] [place], [place], [address] Phone: [phone]   Fax:  [phone]  Name: Emma Henderson MRN: [id number] Date of Birth: [birth year]

## 2015-08-03 ENCOUNTER — Ambulatory Visit (HOSPITAL_COMMUNITY): Payer: Medicare Other | Admitting: Physical Therapy

## 2015-08-03 DIAGNOSIS — R2681 Unsteadiness on feet: Secondary | ICD-10-CM

## 2015-08-03 DIAGNOSIS — M79605 Pain in left leg: Secondary | ICD-10-CM

## 2015-08-03 DIAGNOSIS — M6281 Muscle weakness (generalized): Secondary | ICD-10-CM

## 2015-08-03 NOTE — Therapy (Signed)
[place], [place], [address] Phone: [phone]   Fax:  [phone]  Physical Therapy Treatment  Patient Details  Name: Emma Henderson MRN: [id number] Date of Birth: [birth year] No Data Recorded  Encounter Date: 08/03/2015      PT End of Session - 08/03/15 1034    Visit Number 5   Number of Visits 8   Date for PT Re-Evaluation 08/10/15   Authorization Type [employer] PPO   Authorization Time Period 07/12/2015 through 08/09/2015   PT Start Time 0950   PT Stop Time 1030   PT Time Calculation (min) 40 min   Equipment Utilized During Treatment Gait belt   Activity Tolerance Patient tolerated treatment well   Behavior During Therapy [facility] for tasks assessed/performed      Past Medical History  Diagnosis Date  . Diverticulosis 2006  . Perforated diverticulitis 07/04/2012  . Osteopenia 05/2015    T score -1.6 FRAX 10%/1.6% stable from prior DEXA  . Umbilical hernia [id number]  . Cataract   . Unsteady gait   . [id number])     "probably monthly" 11/04/2012)  . Arthritis     "hands" (11/04/2012)  . Urinary incontinence     Past Surgical History  Procedure Laterality Date  . Cervical spine surgery  ~ 1997  . Lumbar spine surgery  ~ 1987  . Laparoscopic sigmoid colectomy  11/04/2012    ABSCESS DIVERTICULAR     . Colon surgery    . Hernia repair  11/04/2012  . Laparoscopic partial colectomy N/A 11/04/2012    Procedure: LAPAROSCOPIC SIGMOID COLECTOMY;  Surgeon: Harl Bowie, MD;  Location: [place];  Service: General;  Laterality: N/A;  . Proctoplasty N/A 11/04/2012    Procedure: RIGID PROCTOSCOPY;  Surgeon: Harl Bowie, MD;  Location: [place];  Service: General;  Laterality: N/A;    There were no vitals filed for this visit.      Subjective Assessment - 08/03/15 0950    Subjective Pt reports she is doing great. She feels things are much better since her last session. She  is performing her HEP consistently and trying to walk during the week.    Currently in Pain? No/denies                         [facility] Adult PT Treatment/Exercise - 08/03/15 0001    Exercises   Other Exercises  supine hip flexor stretch 3x30 sec each   Knee/Hip Exercises: Standing   Forward Step Up Both;1 set;20 reps;Hand Hold: 1   Forward Step Up Limitations 6in box with contralat. hip flexion, 1 UE support   Knee/Hip Exercises: Sidelying   Hip ADduction Both;1 set;15 reps   Hip ADduction Limitations against wall, #3              Balance Exercises - 08/03/15 1010    Balance Exercises: Standing   Rockerboard Anterior/posterior;Lateral;EO;UE support  2 finger support   Cone Rotation Solid surface;Intermittent upper extremity assist;Right turn;Left turn  NBOS, CGA; 1 trial standing, 1 trial seated   Cone Rotation Limitations (+) trunk flexion and forward LOB noted             PT Education - 08/03/15 1032    Education provided Yes   Education Details updated/reviewed HEP; discussed importance of correct technique with therex to ensure proper muscles are strengthened; encouraged awareness of glute/trunk activation and upright posture during daily  activity to become more habitual   Person(s) Educated Patient   Methods Explanation;Demonstration;Handout   Comprehension Verbalized understanding;Returned demonstration;Need further instruction          PT Short Term Goals - 07/12/15 1600    PT SHORT TERM GOAL #1   Title Pt will demonstrate awareness of forward flexed posture and be able to self correct 80% of the time to improve biomechanics and endurance with sitting and standing tolerance.    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    Time 2    Period Weeks   Status New   PT SHORT TERM GOAL #3   Title Pt will correctly and consistantly demonstrate ability to perform HEP as instructed to improve strength and flexibity and promote improved quality of life.     Time 2   Period Weeks   Status New           PT Long Term Goals - 07/12/15 1604    PT LONG TERM GOAL #1   Title Pt will express 0/10 pain in L LE with all functional activities including standing, walking and sitting to improve quality of life.    Time 4   Period Weeks   Status New   PT LONG TERM GOAL #2   Title Pt will report being able to stand for at least 30 min with no pain in L LE to demonstrate improved postural strength and endurance required to complete choir production.    Time 4   Period Weeks   Status New   PT LONG TERM GOAL #3   Title Pt will demonstrate improved muscle strength in all areas of deficit by at least 1 MMT to improve posture and biomechanics required for optimal performance of functional mobiltiy.    Time 4   Period Weeks   Status New   PT LONG TERM GOAL #4   Title Pt will demonstrate improvement on BERG balance test from 49/56 to at least a 53/56 to demonstrate improved balance and decreased risk of falling.    Time 4   Period Weeks   Status New               Plan - 08/03/15 1127    Clinical Impression Statement Today's session focused on continued hip abductor/extensor strength as well as balance activity with trunk rotation component. Pt with noted trunk flexion during single plane balance activity and PT was able to facilitate improved glute activation with tactile cues. Did note increased difficulty with standing trunk rotation, and unable to correct flexed posture with verbal cues. Updated HEP to address limitations in hip strength and flexibility, with pt able to return correct demonstration of activity.    Rehab Potential Excellent   Clinical Impairments Affecting Rehab Potential Previoius lumbar and colon surgeries.     PT Frequency 2x / week   PT Duration 4 weeks   PT Treatment/Interventions Biofeedback;Moist Heat;Electrical Stimulation;Cryotherapy;Gait training;Functional mobility training;Therapeutic activities;Stair training;Therapeutic exercise;Balance training;Patient/family education;Manual techniques;Energy conservation;Taping;Passive range of motion   PT Next Visit Plan functional strength and balance, dynamic core training    PT Home Exercise Plan addition of step ups with hip flexion, hip flexor stretch with strap and sidelying hip abd with  weight   Recommended Other Services None    Consulted and Agree with Plan of Care Patient      Patient will benefit from skilled therapeutic intervention in order to improve the following deficits and impairments:  Decreased balance, Decreased activity tolerance, Decreased endurance, Decreased mobility, Decreased range of motion, Decreased strength, Hypomobility, Difficulty walking, Increased fascial restricitons, Impaired perceived functional ability, Impaired flexibility, Improper body mechanics, Postural dysfunction, Pain  Visit Diagnosis: Pain In Left Leg  Muscle weakness (generalized)  Unsteadiness on feet     Problem List Patient Active Problem List   Diagnosis Date Noted  . Perforated diverticulitis 07/04/2012  . Osteopenia 07/04/2012  . Umbilical hernia 0000000    11:36 AM,08/03/2015 Elly Modena PT, DPT [facility] [place] [address] [place], [place], [address] Phone: [phone]   Fax:  [phone]  Name: MALANI CRESSWELL MRN: [id number] Date of Birth: Sep 02, 1945

## 2015-08-07 ENCOUNTER — Ambulatory Visit (HOSPITAL_COMMUNITY): Payer: Medicare Other | Admitting: Physical Therapy

## 2015-08-07 DIAGNOSIS — M79605 Pain in left leg: Secondary | ICD-10-CM | POA: Diagnosis not present

## 2015-08-07 DIAGNOSIS — M6281 Muscle weakness (generalized): Secondary | ICD-10-CM

## 2015-08-07 DIAGNOSIS — R2681 Unsteadiness on feet: Secondary | ICD-10-CM

## 2015-08-07 NOTE — Patient Instructions (Signed)
   Tandem Stance  Standing at your counter top, place right heel to left toes.  Use the counter top for safey/balance if needed.  Hold for as long as you can, then switch sides; repeat twice each side, twice a day.    SINGLE LEG STANCE - SLS  Stand on one leg and maintain your balance.  Hold as long as you can, then switch sides. You may put light fingertip support on counter as needed, but keep this minimal. Repeat twice each side, twice a day.

## 2015-08-07 NOTE — Therapy (Signed)
[place], [place], [address] Phone: [phone]   Fax:  [phone]  Physical Therapy Treatment (Re-Assessment)  Patient Details  Name: Emma Henderson MRN: [id number] Date of Birth: [birth year] Referring Provider: Asencion Noble   Encounter Date: 08/07/2015      PT End of Session - 08/07/15 1051    Visit Number 6   Number of Visits 8   Date for PT Re-Evaluation 08/21/15   Authorization Type [employer] Medicare [place] PPO (G-codes done 6th visit)   Authorization Time Period 07/12/2015 through [id number]; cert updated [id number]   Authorization - Visit Number 6   Authorization - Number of Visits 16   PT Start Time [phone]   PT Stop Time 1028   PT Time Calculation (min) 40 min   Activity Tolerance Patient tolerated treatment well   Behavior During Therapy [facility] for tasks assessed/performed      Past Medical History  Diagnosis Date  . Diverticulosis 2006  . Perforated diverticulitis 07/04/2012  . Osteopenia 05/2015    T score -1.6 FRAX 10%/1.6% stable from prior DEXA  . Umbilical hernia 99991111  . Cataract   . Unsteady gait   . [id number])     "probably monthly" 11/04/2012)  . Arthritis     "hands" (11/04/2012)  . Urinary incontinence     Past Surgical History  Procedure Laterality Date  . Cervical spine surgery  ~ 1997  . Lumbar spine surgery  ~ 1987  . Laparoscopic sigmoid colectomy  11/04/2012    ABSCESS DIVERTICULAR     . Colon surgery    . Hernia repair  11/04/2012  . Laparoscopic partial colectomy N/A 11/04/2012    Procedure: LAPAROSCOPIC SIGMOID COLECTOMY;  Surgeon: Harl Bowie, MD;  Location: [place];  Service: General;  Laterality: N/A;  . Proctoplasty N/A 11/04/2012    Procedure: RIGID PROCTOSCOPY;  Surgeon: Harl Bowie, MD;  Location: [place];  Service: General;  Laterality: N/A;    There were no vitals filed for this visit.      Subjective Assessment - 08/07/15 0950    Subjective Patient reports that she feels like she is doing great, she feels better. 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 She is really feeling much better and rates herself as being 80/100; she states that that last 20% is still due to balance concerns.    Pertinent History lumber and cervical back surgeries, colon surgery   How long can you sit comfortably? 7/17- unlimited    How long can you stand comfortably? 7/17- 10 minutes max    How long can you walk comfortably? 7/17- has not tried more than 2-3 miles but feels like she would be able to do better    Patient Stated Goals Pt wants to be able to stand comfortably for as long as she wants to.   Currently in Pain? No/denies            [facility] PT Assessment - 08/07/15 0001    Assessment   Medical Diagnosis imbalance    Referring Provider Asencion Noble    Onset Date/Surgical Date --  chronic    Next MD Visit no followup    Balance Screen   Has the patient fallen in the past 6 months Yes   How many times? 1 in April, coming out of house and tripped on brick    Has the  patient had a decrease in activity level because of a fear of falling?  Yes   Is the patient reluctant to leave their home because of a fear of falling?  Yes   Prior Function   Level of Independence Independent;Independent with basic ADLs;Independent with gait;Independent with transfers   Vocation Retired   Leisure playing bridge,, reading, walking    Strength   Right Hip Flexion 4+/5   Right Hip Extension 4+/5   Right Hip ABduction 4+/5   Left Hip Flexion 4+/5   Left Hip Extension 4+/5   Left Hip ABduction 4-/5   Right Knee Flexion 4+/5   Right Knee Extension 5/5   Left Knee Flexion 4+/5   Left Knee Extension 4+/5   Right Ankle Dorsiflexion 5/5   Left Ankle Dorsiflexion 5/5   6 minute walk test results    Aerobic Endurance Distance Walked 847   Endurance additional comments 3MWT    Berg  Balance Test   Sit to Stand Able to stand without using hands and stabilize independently   Standing Unsupported Able to stand safely 2 minutes   Sitting with Back Unsupported but Feet Supported on Floor or Stool Able to sit safely and securely 2 minutes   Stand to Sit Sits safely with minimal use of hands   Transfers Able to transfer safely, minor use of hands   Standing Unsupported with Eyes Closed Able to stand 10 seconds safely   Standing Ubsupported with Feet Together Able to place feet together independently and stand 1 minute safely   From Standing, Reach Forward with Outstretched Arm Can reach confidently >25 cm (10")   From Standing Position, Pick up Object from Floor Able to pick up shoe safely and easily   From Standing Position, Turn to Look Behind Over each Shoulder Looks behind from both sides and weight shifts well   Turn 360 Degrees Able to turn 360 degrees safely in 4 seconds or less   Standing Unsupported, Alternately Place Feet on Step/Stool Able to stand independently and safely and complete 8 steps in 20 seconds   Standing Unsupported, One Foot in Front Able to place foot tandem independently and hold 30 seconds   Standing on One Leg Able to lift leg independently and hold equal to or more than 3 seconds   Total Score 54                             PT Education - 08/07/15 1050    Education provided Yes   Education Details patient making excellent progress with skilled PT services today; reviewed and updated HEP; POC moving forward    Person(s) Educated Patient   Methods Explanation   Comprehension Verbalized understanding          PT Short Term Goals - 08/07/15 1015    PT SHORT TERM GOAL #1   Title Pt will demonstrate awareness of forward flexed posture and be able to self correct 80% of the time to improve biomechanics and endurance with sitting and standing tolerance.    Baseline 7/17- continues to be forward flexed but awareness is  improving    Time 2   Period Weeks   Status On-going   PT SHORT TERM GOAL #2   Title Pt will report being able to stand for 15 minutes at a time with <2/10 pain in L LE to impove quality of life and be able to tolerate standing through entire choir production  at church.    Baseline 7/17- has not tried    Time 2   Period Weeks   Status On-going   PT SHORT TERM GOAL #3   Title Pt will correctly and consistantly demonstrate ability to perform HEP as instructed to improve strength and flexibity and promote improved quality of life.     Baseline 7/17- reports good compliance    Time 2   Period Weeks   Status Achieved           PT Long Term Goals - 08/07/15 1017    PT LONG TERM GOAL #1   Title Pt will express 0/10 pain in L LE with all functional activities including standing, walking and sitting to improve quality of life.    Baseline 7/17- has not noticed pain but also states she has not pushed herself    Time 4   Period Weeks   Status On-going   PT LONG TERM GOAL #2   Title Pt will report being able to stand for at least 30 min with no pain in L LE to demonstrate improved postural strength and endurance required to complete choir production.    Baseline 7/17- she has not tried this    Time 4   Period Weeks   Status On-going   PT LONG TERM GOAL #3   Title Pt will demonstrate improved muscle strength in all areas of deficit by at least 1 MMT to improve posture and biomechanics required for optimal performance of functional mobiltiy.    Baseline 7/17- doing very well    Time 4   Period Weeks   Status Achieved   PT LONG TERM GOAL #4   Title Pt will demonstrate improvement on BERG balance test from 49/56 to at least a 53/56 to demonstrate improved balance and decreased risk of falling.    Baseline 7/17- 54   Time 4   Period Weeks   Status Achieved               Plan - 08/07/15 1052    Clinical Impression Statement Re-assessment performed today. Patient is making  excellent progress at this time with significant improvement in balance and strength during objective measurements this session; she continues to demonstrate impaired posture however reports awareness of this is improving. She denies interest in local walking club at this time. She will benefit from two more skilled PT sessions in order to address remaining functional limitations and reach optimal level of function with focus on high level balance and ankle endurance/strengthening.    Rehab Potential Excellent   Clinical Impairments Affecting Rehab Potential Previoius lumbar and colon surgeries.    PT Frequency Other (comment)  2 more visits    PT Duration Other (comment)  2 more visits    PT Treatment/Interventions Biofeedback;Moist Heat;Electrical Stimulation;Cryotherapy;Gait training;Functional mobility training;Therapeutic activities;Stair training;Therapeutic exercise;Balance training;Patient/family education;Manual techniques;Energy conservation;Taping;Passive range of motion   PT Next Visit Plan focus on high level balance, high level core training, ankle endurance and strength; DC 2 in visits    Consulted and Agree with Plan of Care Patient      Patient will benefit from skilled therapeutic intervention in order to improve the following deficits and impairments:  Decreased balance, Decreased activity tolerance, Decreased endurance, Decreased mobility, Decreased range of motion, Decreased strength, Hypomobility, Difficulty walking, Increased fascial restricitons, Impaired perceived functional ability, Impaired flexibility, Improper body mechanics, Postural dysfunction, Pain  Visit Diagnosis: Pain In Left Leg - Plan: PT plan of care cert/re-cert  Muscle  weakness (generalized) - Plan: PT plan of care cert/re-cert  Unsteadiness on feet - Plan: PT plan of care cert/re-cert       G-Codes - August 13, 2015 1053    Functional Assessment Tool Used BERG, 3MWT, MMT and Clinical Judgement.     Functional Limitation Mobility: Walking and moving around   Mobility: Walking and Moving Around Current Status [phone]) At least 1 percent but less than 20 percent impaired, limited or restricted   Mobility: Walking and Moving Around Goal Status [phone]) 0 percent impaired, limited or restricted      Problem List Patient Active Problem List   Diagnosis Date Noted  . Perforated diverticulitis 07/04/2012  . Osteopenia 07/04/2012  . Umbilical hernia 0000000    Deniece Ree PT, DPT [place] [address] [place], [place], [address] Phone: [phone]   Fax:  [phone]  Name: NAYALEE RICKLEY MRN: [id number] Date of Birth: [birth year]

## 2015-08-09 ENCOUNTER — Encounter (HOSPITAL_COMMUNITY): Payer: Medicare Other | Admitting: Physical Therapy

## 2015-08-10 ENCOUNTER — Ambulatory Visit (HOSPITAL_COMMUNITY): Payer: Medicare Other | Admitting: Physical Therapy

## 2015-08-10 DIAGNOSIS — R2681 Unsteadiness on feet: Secondary | ICD-10-CM

## 2015-08-10 DIAGNOSIS — M6281 Muscle weakness (generalized): Secondary | ICD-10-CM

## 2015-08-10 DIAGNOSIS — M79605 Pain in left leg: Secondary | ICD-10-CM

## 2015-08-10 NOTE — Therapy (Signed)
[place], [place], [address] Phone: [phone]   Fax:  [phone]  Physical Therapy Treatment  Patient Details  Name: Emma Henderson MRN: [id number] Date of Birth: 1945-12-31 Referring Provider: Asencion Noble   Encounter Date: 08/10/2015      PT End of Session - 08/10/15 0900    Visit Number 7   Number of Visits 8   Date for PT Re-Evaluation 08/21/15   Authorization Type [employer] Medicare [place] PPO (G-codes done 6th visit)   Authorization Time Period 07/12/2015 through [id number]; cert updated [id number]   Authorization - Visit Number 6   Authorization - Number of Visits 16   PT Start Time 0815   PT Stop Time [id number]   PT Time Calculation (min) 42 min   Equipment Utilized During Treatment Gait belt   Activity Tolerance Patient tolerated treatment well   Behavior During Therapy [facility] for tasks assessed/performed      Past Medical History  Diagnosis Date  . Diverticulosis 2006  . Perforated diverticulitis 07/04/2012  . Osteopenia 05/2015    T score -1.6 FRAX 10%/1.6% stable from prior DEXA  . Umbilical hernia [id number]  . Cataract   . Unsteady gait   . [id number])     "probably monthly" 11/04/2012)  . Arthritis     "hands" (11/04/2012)  . Urinary incontinence     Past Surgical History  Procedure Laterality Date  . Cervical spine surgery  ~ 1997  . Lumbar spine surgery  ~ 1987  . Laparoscopic sigmoid colectomy  11/04/2012    ABSCESS DIVERTICULAR     . Colon surgery    . Hernia repair  11/04/2012  . Laparoscopic partial colectomy N/A 11/04/2012    Procedure: LAPAROSCOPIC SIGMOID COLECTOMY;  Surgeon: Harl Bowie, MD;  Location: [place];  Service: General;  Laterality: N/A;  . Proctoplasty N/A 11/04/2012    Procedure: RIGID PROCTOSCOPY;  Surgeon: Harl Bowie, MD;  Location: [place];  Service: General;  Laterality: N/A;    There were no vitals filed for this visit.      Subjective  Assessment - 08/10/15 0820    Subjective Pt reports things are going well. She celebrated her birthday this past week.    Pertinent History lumber and cervical back surgeries, colon surgery   How long can you sit comfortably? 7/17- unlimited    How long can you stand comfortably? 7/17- 10 minutes max    How long can you walk comfortably? 7/17- has not tried more than 2-3 miles but feels like she would be able to do better    Patient Stated Goals Pt wants to be able to stand comfortably for as long as she wants to.   Currently in Pain? No/denies                         [facility] Adult PT Treatment/Exercise - 08/10/15 0001    Exercises   Other Exercises  seated trunk derotation with blue TB and UE punches x15 each  Single leg heel raises x20 each              Balance Exercises - 08/10/15 1015    Balance Exercises: Standing   Standing Eyes Closed Narrow base of support (BOS);Foam/compliant surface;3 reps  ~7 sec, cues to decrease trunk flexion   Tandem Stance Eyes open;Intermittent upper extremity support;3 reps;20 secs  max 10 sec, addition of blue weight ball for 2  sets each   SLS Eyes open;Solid surface;3 reps  L: 9-10 sec; R: 2-6 sec    Tandem Gait Forward;Foam/compliant surface;5 reps   Other Standing Exercises partial tandem with each LE  forward and trunk rotation L<>R x8 reps; NBOS with rows x15 reps using green TB           PT Education - 08/10/15 0859    Education provided Yes   Education Details discussed improvements noted in balance; importance of trunk/hip coordination to improve posture during activity; discussed updated HEP next session   Person(s) Educated Patient   Methods Explanation;Demonstration   Comprehension Verbalized understanding;Returned demonstration          PT Short Term Goals - 08/07/15 1015    PT SHORT TERM GOAL #1   Title Pt will demonstrate awareness of forward flexed posture and be able to self correct 80% of the time to  improve biomechanics and endurance with sitting and standing tolerance.    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    Baseline 7/17- has not tried    Time 2   Period Weeks   Status On-going   PT SHORT TERM GOAL #3   Title Pt will correctly and consistantly demonstrate ability to perform HEP as instructed to improve strength and flexibity and promote improved quality of life.     Baseline 7/17- reports good compliance    Time 2   Period Weeks   Status Achieved           PT Long Term Goals - 08/07/15 1017    PT LONG TERM GOAL #1   Title Pt will express 0/10 pain in L LE with all functional activities including standing, walking and sitting to improve quality of life.    Baseline 7/17- has not noticed pain but also states she has not pushed herself    Time 4   Period Weeks   Status On-going   PT LONG TERM GOAL #2   Title Pt will report being able to stand for at least 30 min with no pain in L LE to demonstrate improved postural strength and endurance required to complete choir production.    Baseline 7/17- she has not tried this    Time 4   Period Weeks   Status On-going   PT LONG TERM GOAL #3   Title Pt will demonstrate improved muscle strength in all areas of deficit by at least 1 MMT to improve posture and biomechanics required for optimal performance of functional mobiltiy.    Baseline 7/17- doing very well    Time 4   Period Weeks   Status Achieved   PT LONG TERM GOAL #4   Title Pt will demonstrate improvement on BERG balance test from 49/56 to at least a 53/56 to demonstrate improved balance and decreased risk of falling.    Baseline 7/17- 54   Time 4   Period Weeks   Status  Achieved               Plan - 08/10/15 0900    Clinical Impression Statement Today's session focused on progressions of balance  activity as well as introduction of trunk endurance/strengthening therex to improve activation. Pt able to tolerate all exercises without report of pain. Will updated and review HEP next session.   Rehab Potential Excellent   Clinical Impairments Affecting Rehab Potential Previoius lumbar and colon surgeries.    PT Frequency Other (comment)  2 more visits    PT Duration Other (comment)  2 more visits    PT Treatment/Interventions Biofeedback;Moist Heat;Electrical Stimulation;Cryotherapy;Gait training;Functional mobility training;Therapeutic activities;Stair training;Therapeutic exercise;Balance training;Patient/family education;Manual techniques;Energy conservation;Taping;Passive range of motion   PT Next Visit Plan d/c next visit   PT Home Exercise Plan final HEP: trunk derotation, balance, hip strength   Consulted and Agree with Plan of Care Patient      Patient will benefit from skilled therapeutic intervention in order to improve the following deficits and impairments:  Decreased balance, Decreased activity tolerance, Decreased endurance, Decreased mobility, Decreased range of motion, Decreased strength, Hypomobility, Difficulty walking, Increased fascial restricitons, Impaired perceived functional ability, Impaired flexibility, Improper body mechanics, Postural dysfunction, Pain  Visit Diagnosis: Pain In Left Leg  Muscle weakness (generalized)  Unsteadiness on feet     Problem List Patient Active Problem List   Diagnosis Date Noted  . Perforated diverticulitis 07/04/2012  . Osteopenia 07/04/2012  . Umbilical hernia 0000000   10:25 AM,08/10/2015 Elly Modena PT, DPT [facility] [place] [address] [place], [place], [address] Phone: [phone]    Fax:  [phone]  Name: Emma Henderson MRN: [id number] Date of Birth: 07-06-1945

## 2015-08-17 ENCOUNTER — Ambulatory Visit (HOSPITAL_COMMUNITY): Payer: Medicare Other | Admitting: Physical Therapy

## 2015-08-17 DIAGNOSIS — M6281 Muscle weakness (generalized): Secondary | ICD-10-CM

## 2015-08-17 DIAGNOSIS — R2681 Unsteadiness on feet: Secondary | ICD-10-CM

## 2015-08-17 DIAGNOSIS — M79605 Pain in left leg: Secondary | ICD-10-CM

## 2015-08-17 NOTE — Therapy (Signed)
[place], [place], [address] Phone: [phone]   Fax:  [phone]  Physical Therapy Treatment/Discharge   Patient Details  Name: Emma Henderson MRN: [id number] Date of Birth: 15-Sep-1945 Referring Provider: Asencion Noble, MD  Encounter Date: 08/17/2015      PT End of Session - 08/17/15 0904    Visit Number 8   Number of Visits 8   Date for PT Re-Evaluation 08/21/15   Authorization Type [employer] Medicare [place] PPO (G-codes done 6th visit)   Authorization Time Period 07/12/2015 through 55/37/4827; cert updated 0/78   Authorization - Visit Number 8   Authorization - Number of Visits 16   PT Start Time 0815   PT Stop Time 0901   PT Time Calculation (min) 46 min   Equipment Utilized During Treatment Gait belt   Activity Tolerance Patient tolerated treatment well   Behavior During Therapy [facility] for tasks assessed/performed      Past Medical History:  Diagnosis Date  . Arthritis    "hands" (11/04/2012)  . Cataract   . Diverticulosis 2006  . MLJQGBEE(100.7)    "probably monthly" 11/04/2012)  . Osteopenia 05/2015   T score -1.6 FRAX 10%/1.6% stable from prior DEXA  . Perforated diverticulitis 07/04/2012  . Umbilical hernia 02/11/9756  . Unsteady gait   . Urinary incontinence     Past Surgical History:  Procedure Laterality Date  . CERVICAL SPINE SURGERY  ~ 1997  . COLON SURGERY    . HERNIA REPAIR  11/04/2012  . LAPAROSCOPIC PARTIAL COLECTOMY N/A 11/04/2012   Procedure: LAPAROSCOPIC SIGMOID COLECTOMY;  Surgeon: Harl Bowie, MD;  Location: [place];  Service: General;  Laterality: N/A;  . LAPAROSCOPIC SIGMOID COLECTOMY  11/04/2012   ABSCESS DIVERTICULAR     . LUMBAR SPINE SURGERY  ~ 68  . PROCTOPLASTY N/A 11/04/2012   Procedure: RIGID PROCTOSCOPY;  Surgeon: Harl Bowie, MD;  Location: [place];  Service: General;  Laterality: N/A;    There were no vitals filed for this visit.       Subjective Assessment - 08/17/15 0819    Subjective Pt reports she is doing well. She feels her balance and everything has made excellent progress. She feels her only issue is standing still in crowds. No other complaints.   Pertinent History lumber and cervical back surgeries, colon surgery   How long can you sit comfortably? unlimited    How long can you stand comfortably? 5 minutes, not because of pain but unsteadiness. 30 min without pain   How long can you walk comfortably? unlimited, maybe an hour   Patient Stated Goals Pt wants to be able to stand comfortably for as long as she wants to.   Currently in Pain? No/denies            [facility] PT Assessment - 08/17/15 0001      Assessment   Medical Diagnosis imbalance    Referring Provider Asencion Noble, MD   Onset Date/Surgical Date --  chronic    Next MD Visit no follow-up      Balance Screen   Has the patient fallen in the past 6 months No   Has the patient had a decrease in activity level because of a fear of falling?  No   Is the patient reluctant to leave their home because of a fear of falling?  No     Prior Function   Level of Independence Independent;Independent with basic ADLs;Independent  with gait;Independent with transfers   Vocation Retired   Leisure playing bridge,, reading, walking      Strength   Right Hip Flexion 5/5   Right Hip Extension 5/5   Right Hip ABduction 4+/5   Left Hip Flexion 5/5   Left Hip Extension 5/5   Left Hip ABduction 4+/5   Right Knee Flexion 5/5  seated   Right Knee Extension 5/5   Left Knee Flexion 5/5  seated   Left Knee Extension 5/5   Right Ankle Dorsiflexion 5/5   Left Ankle Dorsiflexion 5/5     6 minute walk test results    Aerobic Endurance Distance Walked 964   Endurance additional comments 3MWT      Berg Balance Test   Sit to Stand Able to stand without using hands and stabilize independently   Standing Unsupported Able to stand safely 2 minutes   Sitting with Back  Unsupported but Feet Supported on Floor or Stool Able to sit safely and securely 2 minutes   Stand to Sit Sits safely with minimal use of hands   Transfers Able to transfer safely, minor use of hands   Standing Unsupported with Eyes Closed Able to stand 10 seconds safely   Standing Ubsupported with Feet Together Able to place feet together independently and stand 1 minute safely   From Standing, Reach Forward with Outstretched Arm Can reach confidently >25 cm (10")   From Standing Position, Pick up Object from Floor Able to pick up shoe safely and easily   From Standing Position, Turn to Look Behind Over each Shoulder Looks behind from both sides and weight shifts well   Turn 360 Degrees Able to turn 360 degrees safely in 4 seconds or less   Standing Unsupported, Alternately Place Feet on Step/Stool Able to stand independently and safely and complete 8 steps in 20 seconds   Standing Unsupported, One Foot in Front Able to place foot tandem independently and hold 30 seconds   Standing on One Leg Able to lift leg independently and hold 5-10 seconds   Total Score 55   Berg comment: from last assessmnent, SLS atleast 5 sec each.                      [place] Adult PT Treatment/Exercise - 08/17/15 0001      Lumbar Exercises: Standing   Heel Raises 15 reps  single leg   Heel Raises Limitations toe raises BLE   Wall Slides 15 reps   Other Standing Lumbar Exercises hip hikes on 6" step x10 reps each   Other Standing Lumbar Exercises step ups on 6 in step with opposite knee drive 1Z00 reps each                 PT Education - 08/17/15 0904    Education provided Yes   Education Details reviewed goals/POC; updated and reviewed final HEP   Person(s) Educated Patient   Methods Explanation;Demonstration;Handout   Comprehension Verbalized understanding;Returned demonstration          PT Short Term Goals - 08/17/15 0911      PT SHORT TERM GOAL #1   Title Pt will demonstrate  awareness of forward flexed posture and be able to self correct 80% of the time to improve biomechanics and endurance with sitting and standing tolerance.    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    Baseline pt reports she would probable be able to stand for 10+ minutes without pain   Time 2   Period Weeks   Status Achieved     PT SHORT TERM GOAL #3   Title Pt will correctly and consistantly demonstrate ability to perform HEP as instructed to improve strength and flexibity and promote improved quality of life.     Time 2   Period Weeks   Status Achieved           PT Long Term Goals - 08/17/15 0912      PT LONG TERM GOAL #1   Title Pt will express 0/10 pain in L LE with all functional activities including standing, walking and sitting to improve quality of life.    Baseline no report of pain this visit    Time 4   Period Weeks   Status Achieved     PT LONG TERM GOAL #2   Title Pt will report being able to stand for at least 30 min with no pain in L LE to demonstrate improved postural strength and endurance required to complete choir production.    Baseline pt has not tried to stand for this amount of time, but attributes this to having a desire to sit after approximately 5 minutes.   Time 4   Period Weeks   Status Not Met     PT LONG TERM GOAL #3   Title Pt will demonstrate improved muscle strength in all areas of deficit by at least 1 MMT to improve posture and biomechanics required for optimal performance of functional mobiltiy.    Time 4   Period Weeks   Status Achieved     PT LONG TERM GOAL #4   Title Pt will demonstrate improvement on BERG balance test from 49/56 to at least a 53/56 to demonstrate improved balance and decreased risk of falling.     Baseline 55/56   Time 4   Period Weeks   Status Achieved               Plan - 08/17/15 0904    Clinical Impression Statement Pt has made great progress since beginning PT several weeks ago. She reports overall improvement in function as well as increased activity tolerance. She demonstrates improved hip strength to atleast 4/5 MMT and functional endurance evident by distance walked on her 3 MWT. She scored a 55/56 on the Berg balance test, combining scores from her previous assessment, indicating she is at a low risk of falling. Her remaining limitation appears to be her single leg balance, and endurance of hip/trunk musculature evident by occasional forward lean during activity. She demonstrates ability to self-correct her posture, and her endurance should improve with adherence to the provided advanced HEP. At this time, she no longer requires skilled PT services and is being discharged from OPPT with goals met and independence with her HEP.   Rehab Potential Excellent   Clinical Impairments Affecting Rehab Potential Previoius lumbar and colon surgeries.    PT Frequency Other (comment)  2 more visits    PT Duration Other (comment)  2 more visits    PT Treatment/Interventions Biofeedback;Moist Heat;Electrical Stimulation;Cryotherapy;Gait training;Functional mobility training;Therapeutic activities;Stair training;Therapeutic exercise;Balance training;Patient/family education;Manual techniques;Energy conservation;Taping;Passive range of motion   PT Next Visit Plan  d/c next visit   PT Home Exercise Plan see pt instructions    Recommended Other Services none    Consulted and Agree with Plan of Care Patient      Patient will benefit from skilled therapeutic intervention in order to improve the following deficits and impairments:  Decreased balance, Decreased activity tolerance, Decreased endurance, Decreased mobility, Decreased range of motion, Decreased strength, Hypomobility, Difficulty  walking, Increased fascial restricitons, Impaired perceived functional ability, Impaired flexibility, Improper body mechanics, Postural dysfunction, Pain  Visit Diagnosis: Pain In Left Leg  Muscle weakness (generalized)  Unsteadiness on feet       G-Codes - 2015-09-12 0913    Functional Assessment Tool Used BERG, 3MWT, MMT and Clinical Judgement.    Functional Limitation Mobility: Walking and moving around   Mobility: Walking and Moving Around Goal Status [phone]) 0 percent impaired, limited or restricted   Mobility: Walking and Moving Around Discharge Status [id number]) At least 1 percent but less than 20 percent impaired, limited or restricted      Problem List Patient Active Problem List   Diagnosis Date Noted  . Perforated diverticulitis 07/04/2012  . Osteopenia 07/04/2012  . Umbilical hernia 75/17/0017   PHYSICAL THERAPY DISCHARGE SUMMARY  Visits from Start of Care: 8 Current functional level related to goals / functional outcomes: Improved BLE strength to atleast 4/5 MMT, 3 MWT of 900+ ft, Berg balance 55/56. See clinical impression for more details.    Remaining deficits: Single leg balance, postural control endurance   Education / Equipment: Advanced home management program  Plan: Patient agrees to discharge.  Patient goals were met. Patient is being discharged due to meeting the stated rehab goals.  ?????         9:15 AM,12-Sep-2015 Elly Modena PT, DPT [facility] [place] [address] [place], [place], [address] Phone: [phone]   Fax:  [phone]  Name: Emma Henderson MRN: [id number] Date of Birth: 03-09-1945

## 2015-08-17 NOTE — Patient Instructions (Signed)
SINGLE LEG STANCE - SLS  Stand on one leg and maintain your balance.  try to hold up to 10-15 sec on each leg.   HIP HIKES  While standing up on a step, lower one leg downward towards the floor by tilting your pelvis to the side.   Then return the pelvis/leg back to a leveled position.  x10 reps each.   keep posture upright.   Forward Step Ups with Leg Drivers  Step up onto a 6-8" step with one foot.  With the opposite leg drive the knee up towards your chest. Hold 2 sec.  Return both feet to the floor and repeat.  2x15.   keep posture upright.   WALL SQUATS  Leaning up against a wall or closed door on your back, slide your body downward and then return back to upright position.  A door was used here because it was smoother and had less friction than the wall.   Knees should bend in line with the 2nd toe and not pass the front of the foot.   Feet shoulder width apart. 2x15     *encouraged pt to continue with ankle 4 way exercises previously given by therapist.

## 2016-01-31 DIAGNOSIS — Z0100 Encounter for examination of eyes and vision without abnormal findings: Secondary | ICD-10-CM | POA: Diagnosis not present

## 2016-02-20 ENCOUNTER — Other Ambulatory Visit: Payer: Self-pay | Admitting: Gynecology

## 2016-02-20 DIAGNOSIS — Z1231 Encounter for screening mammogram for malignant neoplasm of breast: Secondary | ICD-10-CM

## 2016-03-21 ENCOUNTER — Ambulatory Visit (HOSPITAL_COMMUNITY)
Admission: RE | Admit: 2016-03-21 | Discharge: 2016-03-21 | Disposition: A | Discharge status: Home / Self Care | Payer: Medicare HMO | Source: Ambulatory Visit | Attending: Gynecology | Admitting: Gynecology

## 2016-03-21 DIAGNOSIS — Z1231 Encounter for screening mammogram for malignant neoplasm of breast: Secondary | ICD-10-CM | POA: Diagnosis not present

## 2016-05-13 ENCOUNTER — Ambulatory Visit (INDEPENDENT_AMBULATORY_CARE_PROVIDER_SITE_OTHER): Payer: Medicare HMO | Admitting: Gynecology

## 2016-05-13 ENCOUNTER — Encounter: Payer: Self-pay | Admitting: Gynecology

## 2016-05-13 VITALS — BP 118/74 | Ht 63.0 in | Wt 147.0 lb

## 2016-05-13 DIAGNOSIS — M8580 Other specified disorders of bone density and structure, unspecified site: Secondary | ICD-10-CM | POA: Diagnosis not present

## 2016-05-13 DIAGNOSIS — Z01411 Encounter for gynecological examination (general) (routine) with abnormal findings: Secondary | ICD-10-CM | POA: Diagnosis not present

## 2016-05-13 DIAGNOSIS — N952 Postmenopausal atrophic vaginitis: Secondary | ICD-10-CM

## 2016-05-13 MED ORDER — ESTRADIOL 10 MCG VA TABS: 1.0000 | ORAL_TABLET | VAGINAL | 11 refills | Status: DC

## 2016-05-13 NOTE — Patient Instructions (Signed)

## 2016-05-13 NOTE — Progress Notes (Signed)
    Emma Henderson 1945-02-18 [id number]        72 y.o.  [id number] for breast and pelvic exam.    Past medical history,surgical history, problem list, medications, allergies, family history and social history were all reviewed and documented as reviewed in the EPIC chart.  ROS:  Performed with pertinent positives and negatives included in the history, assessment and plan.   Additional significant findings :  None   Exam: Caryn Bee assistant Vitals:   05/13/16 0924  BP: 118/74  Weight: 147 lb (66.7 kg)  Height: 5\' 3"  (1.6 m)   Body mass index is 26.04 kg/m.  General appearance:  Normal affect, orientation and appearance. Skin: Grossly normal HEENT: Without gross lesions.  No cervical or supraclavicular adenopathy. Thyroid normal.  Lungs:  Clear without wheezing, rales or rhonchi Cardiac: RR, without RMG Abdominal:  Soft, nontender, without masses, guarding, rebound, organomegaly or hernia Breasts:  Examined lying and sitting without masses, retractions, discharge or axillary adenopathy. Pelvic:  Ext, BUS, Vagina: With atrophic changes  Cervix: With atrophic changes  Uterus: Anteverted, normal size, shape and contour, midline and mobile nontender   Adnexa: Without masses or tenderness    Anus and perineum: Normal   Rectovaginal: Normal sphincter tone without palpated masses or tenderness.    Assessment/Plan:  71 y.o. [id number] female for breast and pelvic exam.   1. Postmenopausal/atrophic genital changes. Continues on Vagifem twice weekly doing well. Notes that it seems to help with her urinary incontinence also. We have discussed the issues of absorption and systemic risks and she is comfortable continuing. Refill 1 year provided. 2. Osteopenia. DEXA 05/2015 T score -1.6 FRAX 10%/1.6%. Stable from prior DEXA. Patient has not had a vitamin D level checked in some time. Check vitamin D level today. Plan repeat DEXA in another year or 2. 3. Pap smear/HPV 01/2012. No Pap smear done  today. No history of significant abnormal Pap smears. Reviewed current screening guidelines and patient prefers to stop screening and she is over the age of 40. 4. Mammography 03/2016. Continue with annual mammography when due. SBE monthly reviewed. 5. Colonoscopy 2014. Repeat at their recommended interval. 6. Health maintenance. No routine lab work done as patient does this elsewhere. Follow up 1 year, sooner as needed.   Anastasio Auerbach MD, 9:49 AM 05/13/2016

## 2016-05-14 ENCOUNTER — Encounter: Payer: Self-pay | Admitting: Gynecology

## 2016-05-14 LAB — VITAMIN D 25 HYDROXY (VIT D DEFICIENCY, FRACTURES): Vit D, 25-Hydroxy: 40 ng/mL (ref 30–100)

## 2016-05-27 DIAGNOSIS — R69 Illness, unspecified: Secondary | ICD-10-CM | POA: Diagnosis not present

## 2016-06-24 DIAGNOSIS — R69 Illness, unspecified: Secondary | ICD-10-CM | POA: Diagnosis not present

## 2016-07-01 DIAGNOSIS — M5030 Other cervical disc degeneration, unspecified cervical region: Secondary | ICD-10-CM | POA: Diagnosis not present

## 2016-07-01 DIAGNOSIS — M5126 Other intervertebral disc displacement, lumbar region: Secondary | ICD-10-CM | POA: Diagnosis not present

## 2016-07-01 DIAGNOSIS — Z79899 Other long term (current) drug therapy: Secondary | ICD-10-CM | POA: Diagnosis not present

## 2016-07-08 DIAGNOSIS — Z6826 Body mass index (BMI) 26.0-26.9, adult: Secondary | ICD-10-CM | POA: Diagnosis not present

## 2016-07-08 DIAGNOSIS — Z0001 Encounter for general adult medical examination with abnormal findings: Secondary | ICD-10-CM | POA: Diagnosis not present

## 2016-07-08 DIAGNOSIS — M8580 Other specified disorders of bone density and structure, unspecified site: Secondary | ICD-10-CM | POA: Diagnosis not present

## 2016-07-08 DIAGNOSIS — M5136 Other intervertebral disc degeneration, lumbar region: Secondary | ICD-10-CM | POA: Diagnosis not present

## 2016-07-29 DIAGNOSIS — L821 Other seborrheic keratosis: Secondary | ICD-10-CM | POA: Diagnosis not present

## 2016-07-29 DIAGNOSIS — I788 Other diseases of capillaries: Secondary | ICD-10-CM | POA: Diagnosis not present

## 2016-07-29 DIAGNOSIS — D2262 Melanocytic nevi of left upper limb, including shoulder: Secondary | ICD-10-CM | POA: Diagnosis not present

## 2016-07-29 DIAGNOSIS — L218 Other seborrheic dermatitis: Secondary | ICD-10-CM | POA: Diagnosis not present

## 2016-08-06 ENCOUNTER — Ambulatory Visit (HOSPITAL_COMMUNITY): Payer: Medicare HMO | Attending: Internal Medicine | Admitting: Physical Therapy

## 2016-08-06 ENCOUNTER — Encounter (HOSPITAL_COMMUNITY): Payer: Self-pay | Admitting: Physical Therapy

## 2016-08-06 DIAGNOSIS — R2681 Unsteadiness on feet: Secondary | ICD-10-CM | POA: Diagnosis present

## 2016-08-06 DIAGNOSIS — M6281 Muscle weakness (generalized): Secondary | ICD-10-CM | POA: Insufficient documentation

## 2016-08-06 DIAGNOSIS — R29898 Other symptoms and signs involving the musculoskeletal system: Secondary | ICD-10-CM | POA: Diagnosis present

## 2016-08-06 NOTE — Therapy (Signed)
[place], [place], [address] Phone: [phone]   Fax:  [phone]  Physical Therapy Evaluation  Patient Details  Name: Emma Henderson MRN: [id number] Date of Birth: July 11, 1945 Referring Provider: Asencion Noble   Encounter Date: 08/06/2016      PT End of Session - 08/06/16 0947    Visit Number 1   Number of Visits 13   Date for PT Re-Evaluation 08/27/16   Authorization Type [employer] Medicare HMO/BCBS state health/UHC Medicare    Authorization Time Period 08/06/16 to 09/17/16   Authorization - Visit Number 1   Authorization - Number of Visits 10   PT Start Time 0901   PT Stop Time 0940   PT Time Calculation (min) 39 min   Equipment Utilized During Treatment Gait belt   Activity Tolerance Patient tolerated treatment well   Behavior During Therapy [facility] for tasks assessed/performed      Past Medical History:  Diagnosis Date  . Arthritis    "hands" (11/04/2012)  . Cataract   . Diverticulosis 2006  . OTLXBWIO(035.5)    "probably monthly" 11/04/2012)  . Osteopenia 05/2015   T score -1.6 FRAX 10%/1.6% stable from prior DEXA  . Perforated diverticulitis 07/04/2012  . Umbilical hernia 9/74/1638  . Unsteady gait   . Urinary incontinence     Past Surgical History:  Procedure Laterality Date  . CERVICAL SPINE SURGERY  ~ 1997  . COLON SURGERY    . HERNIA REPAIR  11/04/2012  . LAPAROSCOPIC PARTIAL COLECTOMY N/A 11/04/2012   Procedure: LAPAROSCOPIC SIGMOID COLECTOMY;  Surgeon: Harl Bowie, MD;  Location: [place];  Service: General;  Laterality: N/A;  . LAPAROSCOPIC SIGMOID COLECTOMY  11/04/2012   ABSCESS DIVERTICULAR     . LUMBAR SPINE SURGERY  ~ 49  . PROCTOPLASTY N/A 11/04/2012   Procedure: RIGID PROCTOSCOPY;  Surgeon: Harl Bowie, MD;  Location: [place];  Service: General;  Laterality: N/A;    There were no vitals filed for this visit.       Subjective Assessment - 08/06/16 0902    Subjective  Patient arrives stating that her main issue right now is imbalance. She has been doing a lot of walking and has been trying to keep up with exercises like SLS. She feels unsteady when she stands. She has not had falls for a year but has had quite a few stumbles. She does have numbness in her L foot from low back surgery.    Pertinent History hx cervical and lumbar surgeries, ostenopenia    Patient Stated Goals improve balance    Currently in Pain? Yes   Pain Score 3    Pain Location Neck   Pain Orientation Right   Pain Descriptors / Indicators Aching   Pain Type Chronic pain   Pain Radiating Towards can move into headache sometimes    Pain Onset In the past 7 days   Pain Frequency Constant   Aggravating Factors  not sure    Pain Relieving Factors some movements    Effect of Pain on Daily Activities annoying             [facility] PT Assessment - 08/06/16 0001      Assessment   Medical Diagnosis gait/imbalance    Referring Provider Asencion Noble    Onset Date/Surgical Date --  chronic    Next MD Visit Dr. Willey Blade next year    Prior Therapy PT last year      Precautions  Precautions Fall     Balance Screen   Has the patient fallen in the past 6 months No   Has the patient had a decrease in activity level because of a fear of falling?  Yes   Is the patient reluctant to leave their home because of a fear of falling?  No     Prior Function   Level of Independence Independent;Independent with gait;Independent with transfers   Vocation Retired   Leisure choir, vacation, going outdoors/walking, swimming      Observation/Other Assessments   Observations shakey and mild unsteadiness after 3 min static standing      Strength   Right Hip Flexion 4-/5   Right Hip Extension 4+/5   Right Hip ABduction 4+/5   Left Hip Flexion 4/5   Left Hip Extension 4+/5   Left Hip ABduction 4/5   Right Knee Flexion 4+/5   Right Knee Extension 5/5   Left Knee Flexion 4/5   Left Knee Extension 5/5    Right Ankle Dorsiflexion 5/5   Left Ankle Dorsiflexion 5/5     Ambulation/Gait   Gait Comments proximal weakness      6 minute walk test results    Aerobic Endurance Distance Walked 994   Endurance additional comments 3MWT      Standardized Balance Assessment   Standardized Balance Assessment Dynamic Gait Index     Dynamic Gait Index   Level Surface Normal   Change in Gait Speed Normal   Gait with Horizontal Head Turns Mild Impairment   Gait with Vertical Head Turns Mild Impairment   Gait and Pivot Turn Normal   Step Over Obstacle Mild Impairment   Step Around Obstacles Mild Impairment   Steps Mild Impairment   Total Score 19   DGI comment: 79%            Objective measurements completed on examination: See above findings.          [place] Adult PT Treatment/Exercise - 08/06/16 0001      Exercises   Exercises Knee/Hip     Knee/Hip Exercises: Standing   Other Standing Knee Exercises hip hikes 1x10      Knee/Hip Exercises: Supine   Bridges Both;1 set;10 reps   Bridges Limitations 3 second holds     Knee/Hip Exercises: Sidelying   Hip ABduction Both;1 set;10 reps             Balance Exercises - 08/06/16 0938      Balance Exercises: Standing   Tandem Stance Eyes open;1 rep;15 secs  B           PT Education - 08/06/16 0947    Education provided Yes   Education Details we will need specific MD order for her neck as this is not related to her balance problem; exam findings, prognosis, POC, discussed HEP    Person(s) Educated Patient   Methods Explanation;Demonstration;Handout   Comprehension Verbalized understanding;Returned demonstration;Need further instruction          PT Short Term Goals - 08/06/16 1001      PT SHORT TERM GOAL #1   Title Patient to be able to verbalize 5 ways to prevent a fall in order to improve general safety at home and in community    Time 3   Period Weeks   Status New     PT SHORT TERM GOAL #2   Title Patient  to be able to stand for at least 15 minutes without holding onto objects around her  and without fatigue to improve participation in chior at church    Time 3   Period Weeks   Status New     PT SHORT TERM GOAL #3   Title Patient to be compliant with correct performance of HEP, to be updated PRN    Time 1   Period Weeks   Status New           PT Long Term Goals - 08/06/16 1002      PT LONG TERM GOAL #1   Title Patient to demonstrate functional strength as being 5/5 in order to improve balance and tolerance to functional activities    Time 6   Period Weeks   Status New     PT LONG TERM GOAL #2   Title Patient to be able to stand for 30 minutes without fatigue or needing to hold onto surrounding items in order to improve functional task participation and confidence    Time 6   Period Weeks   Status New     PT LONG TERM GOAL #3   Title Patient to score at least 22 on DGI in order to show improved balance and improved general safety    Time 6   Period Weeks   Status New     PT LONG TERM GOAL #4   Title Patient to be participatory in balance exercise program (cardio, flexibility, balance) in [place] to improve general health status and prevent recurrence of condition    Time 6   Period Weeks   Status New                Plan - 08/06/16 0959    Clinical Impression Statement Patient arrives with ongoing chronic complaints of general unsteadiness and difficulty with standing in one spot due to feelings of weakness and balance issues. She also reports her neck is hurting her, however MD order does not include this area and she will require specific MD order for cervical spine for [place] to formally address this. Examination reveals functional muscle weakness as well as general unsteadiness as indicated by DGI scoring and difficulty with maintaining static standing position. Recommend skilled PT services to address functional deficits, reduce fall risk, and assist in return to  optimal level of function.    History and Personal Factors relevant to plan of care: susccess with PT in the past, return of general condition    Clinical Presentation Stable   Clinical Presentation due to: chronic impairments    Clinical Decision Making Low   Rehab Potential Good   Clinical Impairments Affecting Rehab Potential (+) success with PT in the past, good general health; (-) return of condition    PT Frequency 2x / week   PT Duration 6 weeks   PT Treatment/Interventions ADLs/Self Care Home Management;DME Instruction;Gait training;Stair training;Functional mobility training;Therapeutic activities;Therapeutic exercise;Balance training;Neuromuscular re-education;Patient/family education;Manual techniques   PT Next Visit Plan review initial eval/goals; focus on functional strength and balance. Nustep or TM.    PT Home Exercise Plan Eval: bridges, sidelying hip ABD, hip hikes, tandem stance    Consulted and Agree with Plan of Care Patient      Patient will benefit from skilled therapeutic intervention in order to improve the following deficits and impairments:  Abnormal gait, Decreased coordination, Decreased mobility, Decreased activity tolerance, Decreased strength, Decreased balance  Visit Diagnosis: Unsteadiness on feet - Plan: PT plan of care cert/re-cert  Muscle weakness (generalized) - Plan: PT plan of care cert/re-cert  Other symptoms and signs  involving the musculoskeletal system - Plan: PT plan of care cert/re-cert      G-Codes - 24/11/46 1005    Functional Assessment Tool Used (Outpatient Only) Based on skilled PT assessment of strength, gait, balance, fall risk    Functional Limitation Mobility: Walking and moving around   Mobility: Walking and Moving Around Current Status ([id number]) At least 20 percent but less than 40 percent impaired, limited or restricted   Mobility: Walking and Moving Around Goal Status [id number]) At least 1 percent but less than 20 percent impaired,  limited or restricted       Problem List Patient Active Problem List   Diagnosis Date Noted  . Perforated diverticulitis 07/04/2012  . Osteopenia 07/04/2012  . Umbilical hernia 10/24/4959    Deniece Ree PT, DPT [place] [address] [place], [place], [address] Phone: [phone]   Fax:  [phone]  Name: Emma Henderson MRN: [id number] Date of Birth: [birth year]

## 2016-08-06 NOTE — Patient Instructions (Signed)
   BRIDGING  While lying on your back, tighten your lower abdominals, squeeze your buttocks and then raise your buttocks off the floor/bed as creating a "Bridge" with your body.   Hold for 3 seconds and then lower yourself and repeat 10-15 times, twice a day.    HIP ABDUCTION - SIDELYING  While lying on your side, slowly raise up your top leg to the side. Keep your knee straight and maintain your toes pointed forward the entire time. Keep your leg in-line with your body.  The bottom leg can be bent to stabilize your body.  Repeat 10-15 times each leg, twice a day.    HIP HIKES  While standing up on a step (floor is fine too), lower one leg downward towards the floor by tilting your pelvis to the side.   Then return the pelvis/leg back to a leveled position.  Repeat 10-15 times each leg, twice a day.    TANDEM STANCE BALANCE (DO AT YOUR KITCHEN COUNTER FOR SAFETY)  Stand and balnace in tandem stance. Hold this position for at least 15 seconds.   Relax and repeat 3 times each side, twice a day.

## 2016-08-08 ENCOUNTER — Ambulatory Visit (HOSPITAL_COMMUNITY): Payer: Medicare HMO | Admitting: Physical Therapy

## 2016-08-13 ENCOUNTER — Ambulatory Visit (HOSPITAL_COMMUNITY): Payer: Medicare HMO | Admitting: Physical Therapy

## 2016-08-13 ENCOUNTER — Encounter (HOSPITAL_COMMUNITY): Payer: Medicare HMO | Admitting: Physical Therapy

## 2016-08-13 DIAGNOSIS — M6281 Muscle weakness (generalized): Secondary | ICD-10-CM

## 2016-08-13 DIAGNOSIS — R29898 Other symptoms and signs involving the musculoskeletal system: Secondary | ICD-10-CM

## 2016-08-13 DIAGNOSIS — R2681 Unsteadiness on feet: Secondary | ICD-10-CM

## 2016-08-13 NOTE — Patient Instructions (Signed)
REGULAR WALKING PROGRAM  For the next 3 days, use a fitbit or other fitness device such as a pedometer, and just track how many steps you are taking each day.  On day 4, add [phone] steps.   Continue to progress.   Example:  Day 1: 3000 steps  Day 2: 3000 steps  Day 3: 3000 steps  Day 4: 4000 steps  Day 5: 4000 steps  Day 6: 4000 steps  Day 7: 5000 steps   Etc     You can also measure this with time- for example, start by walking for 5 minutes continuously, and increase.  For example:  Day 1: 5 minutes  2-3 times per day  Day 2: 5 minutes 2-3 times per day  Day 3: 5 minutes 2-3 times per day  Day 4: 7-10 minutes 2-3 times per day  Etc

## 2016-08-13 NOTE — Therapy (Signed)
[place], [place], [address] Phone: [phone]   Fax:  [phone]  Physical Therapy Treatment  Patient Details  Name: Emma Henderson MRN: [id number] Date of Birth: [birth year] Referring Provider: Asencion Noble   Encounter Date: 08/13/2016      PT End of Session - 08/13/16 0859    Visit Number 2   Number of Visits 13   Date for PT Re-Evaluation 08/27/16   Authorization Type [employer] Medicare HMO/BCBS state health/UHC Medicare    Authorization Time Period 08/06/16 to 09/17/16   Authorization - Visit Number 2   Authorization - Number of Visits 10   PT Start Time 0816   PT Stop Time 0858   PT Time Calculation (min) 42 min   Activity Tolerance Patient tolerated treatment well;Patient limited by fatigue   Behavior During Therapy [facility] for tasks assessed/performed      Past Medical History:  Diagnosis Date  . Arthritis    "hands" (11/04/2012)  . Cataract   . Diverticulosis 2006  . VOZDGUYQ(034.7)    "probably monthly" 11/04/2012)  . Osteopenia 05/2015   T score -1.6 FRAX 10%/1.6% stable from prior DEXA  . Perforated diverticulitis 07/04/2012  . Umbilical hernia 05/15/9561  . Unsteady gait   . Urinary incontinence     Past Surgical History:  Procedure Laterality Date  . CERVICAL SPINE SURGERY  ~ 1997  . COLON SURGERY    . HERNIA REPAIR  11/04/2012  . LAPAROSCOPIC PARTIAL COLECTOMY N/A 11/04/2012   Procedure: LAPAROSCOPIC SIGMOID COLECTOMY;  Surgeon: Harl Bowie, MD;  Location: [place];  Service: General;  Laterality: N/A;  . LAPAROSCOPIC SIGMOID COLECTOMY  11/04/2012   ABSCESS DIVERTICULAR     . LUMBAR SPINE SURGERY  ~ 6  . PROCTOPLASTY N/A 11/04/2012   Procedure: RIGID PROCTOSCOPY;  Surgeon: Harl Bowie, MD;  Location: [place];  Service: General;  Laterality: N/A;    There were no vitals filed for this visit.      Subjective Assessment - 08/13/16 8756    Subjective Patient arrives stating that  her neck is still bothering her; her main issue continues to be imbalance but she has been working on exercises. She has not seen her MD about an order for her neck yet.    Pertinent History hx cervical and lumbar surgeries, ostenopenia    Patient Stated Goals improve balance    Currently in Pain? Yes   Pain Score 3    Pain Location Neck   Pain Orientation Right   Pain Descriptors / Indicators Aching   Pain Type Chronic pain   Pain Radiating Towards headache sometimes    Pain Onset In the past 7 days   Pain Frequency Constant   Aggravating Factors  not sure    Pain Relieving Factors some movements    Effect of Pain on Daily Activities annoying                          [facility] Adult PT Treatment/Exercise - 08/13/16 0001      Knee/Hip Exercises: Standing   Heel Raises Both;1 set;20 reps   Heel Raises Limitations heel and toe    Lateral Step Up Both;1 set;15 reps   Lateral Step Up Limitations 4 inch box U HHA    Forward Step Up Both;1 set;15 reps   Forward Step Up Limitations 4 inch box U HHA    Other Standing Knee Exercises hip hikes  2x15 B with and without swing              Balance Exercises - 08/13/16 0824      Balance Exercises: Standing   Tandem Stance Eyes open;Foam/compliant surface;3 reps;20 secs   SLS Solid surface;Eyes open;3 reps;15 secs   Tandem Gait Forward;4 reps;Retro  inside parallel bars; x4 laps forward and retro each    Step Over Hurdles / [place]es forward and lateral navigation of 12 inch hurdles no HHA            PT Education - 08/13/16 0858    Education provided Yes   Education Details review of initial eval and goals; encouraged progression of speed and distance with her walking    Person(s) Educated Patient   Methods Explanation;Handout   Comprehension Verbalized understanding          PT Short Term Goals - 08/06/16 1001      PT SHORT TERM GOAL #1   Title Patient to be able to verbalize 5 ways to prevent a fall in order  to improve general safety at home and in community    Time 3   Period Weeks   Status New     PT SHORT TERM GOAL #2   Title Patient to be able to stand for at least 15 minutes without holding onto objects around her and without fatigue to improve participation in chior at church    Time 3   Period Weeks   Status New     PT [place] #3   Title Patient to be compliant with correct performance of HEP, to be updated PRN    Time 1   Period Weeks   Status New           PT Long Term Goals - 08/06/16 1002      PT LONG TERM GOAL #1   Title Patient to demonstrate functional strength as being 5/5 in order to improve balance and tolerance to functional activities    Time 6   Period Weeks   Status New     PT LONG TERM GOAL #2   Title Patient to be able to stand for 30 minutes without fatigue or needing to hold onto surrounding items in order to improve functional task participation and confidence    Time 6   Period Weeks   Status New     PT LONG TERM GOAL #3   Title Patient to score at least 22 on DGI in order to show improved balance and improved general safety    Time 6   Period Weeks   Status New     PT LONG TERM GOAL #4   Title Patient to be participatory in balance exercise program (cardio, flexibility, balance) in [place] to improve general health status and prevent recurrence of condition    Time 6   Period Weeks   Status New               Plan - 08/13/16 0859    Clinical Impression Statement Patient arrives today pleasant and continuing to be motivated to participate in PT. Reviewed initial eval and goals then proceeded with balance and strength training today, emphasizing work on unsteady surfaces to further challenge balance work today. Patient continues to report neck pain however has not talked to her MD about PT order for this yet. She demonstrates poor functional activity tolerance in general this session.    Rehab Potential Good   Clinical Impairments  Affecting Rehab Potential (+) success with PT in the past, good general health; (-) return of condition    PT Frequency 2x / week   PT Duration 6 weeks   PT Treatment/Interventions ADLs/Self Care Home Management;DME Instruction;Gait training;Stair training;Functional mobility training;Therapeutic activities;Therapeutic exercise;Balance training;Neuromuscular re-education;Patient/family education;Manual techniques   PT Next Visit Plan continue focus on balance and strength; introduce TM    PT Home Exercise Plan Eval: bridges, sidelying hip ABD, hip hikes, tandem stance    Consulted and Agree with Plan of Care Patient      Patient will benefit from skilled therapeutic intervention in order to improve the following deficits and impairments:  Abnormal gait, Decreased coordination, Decreased mobility, Decreased activity tolerance, Decreased strength, Decreased balance  Visit Diagnosis: Unsteadiness on feet  Muscle weakness (generalized)  Other symptoms and signs involving the musculoskeletal system     Problem List Patient Active Problem List   Diagnosis Date Noted  . Perforated diverticulitis 07/04/2012  . Osteopenia 07/04/2012  . Umbilical hernia 64/84/7207    Deniece Ree PT, DPT [place] [address] [place], [place], [address] Phone: [phone]   Fax:  [phone]  Name: RICQUEL FOULK MRN: [id number] Date of Birth: February 28, 1945

## 2016-08-15 ENCOUNTER — Ambulatory Visit (HOSPITAL_COMMUNITY): Payer: Medicare HMO

## 2016-08-15 DIAGNOSIS — R2681 Unsteadiness on feet: Secondary | ICD-10-CM

## 2016-08-15 DIAGNOSIS — R29898 Other symptoms and signs involving the musculoskeletal system: Secondary | ICD-10-CM

## 2016-08-15 DIAGNOSIS — M6281 Muscle weakness (generalized): Secondary | ICD-10-CM

## 2016-08-15 NOTE — Therapy (Signed)
[place], [place], [address] Phone: [phone]   Fax:  [phone]  Physical Therapy Treatment  Patient Details  Name: Emma Henderson MRN: [id number] Date of Birth: 02/18/1945 Referring Provider: Asencion Noble   Encounter Date: 08/15/2016      PT End of Session - 08/15/16 0907    Visit Number 3   Number of Visits 13   Date for PT Re-Evaluation 08/27/16   Authorization Type [employer] Medicare HMO/BCBS state health/UHC Medicare    Authorization Time Period 08/06/16 to 09/17/16   Authorization - Visit Number 3   Authorization - Number of Visits 10   PT Start Time 0902   PT Stop Time 0945   PT Time Calculation (min) 43 min   Equipment Utilized During Treatment Gait belt   Activity Tolerance Patient tolerated treatment well;Patient limited by fatigue   Behavior During Therapy [facility] for tasks assessed/performed      Past Medical History:  Diagnosis Date  . Arthritis    "hands" (11/04/2012)  . Cataract   . Diverticulosis 2006  . NIDPOEUM(353.6)    "probably monthly" 11/04/2012)  . Osteopenia 05/2015   T score -1.6 FRAX 10%/1.6% stable from prior DEXA  . Perforated diverticulitis 07/04/2012  . Umbilical hernia 1/44/3154  . Unsteady gait   . Urinary incontinence     Past Surgical History:  Procedure Laterality Date  . CERVICAL SPINE SURGERY  ~ 1997  . COLON SURGERY    . HERNIA REPAIR  11/04/2012  . LAPAROSCOPIC PARTIAL COLECTOMY N/A 11/04/2012   Procedure: LAPAROSCOPIC SIGMOID COLECTOMY;  Surgeon: Harl Bowie, MD;  Location: [facility];  Service: General;  Laterality: N/A;  . LAPAROSCOPIC SIGMOID COLECTOMY  11/04/2012   ABSCESS DIVERTICULAR     . LUMBAR SPINE SURGERY  ~ 17  . PROCTOPLASTY N/A 11/04/2012   Procedure: RIGID PROCTOSCOPY;  Surgeon: Harl Bowie, MD;  Location: [facility];  Service: General;  Laterality: N/A;    There were no vitals filed for this visit.      Subjective Assessment - 08/15/16  0904    Subjective Pt reports she completed her HEP prior session today.  Pt continues to c/o neck pain, has not seen MD for order for neck yet.   Pertinent History hx cervical and lumbar surgeries, ostenopenia    Patient Stated Goals improve balance    Currently in Pain? Yes   Pain Score 3    Pain Location Neck   Pain Orientation Right   Pain Descriptors / Indicators Aching   Pain Type Chronic pain   Pain Onset In the past 7 days   Pain Frequency Constant   Aggravating Factors  not sure   Pain Relieving Factors some movement   Effect of Pain on Daily Activities annoying                         [facility] Adult PT Treatment/Exercise - 08/15/16 0001      Knee/Hip Exercises: Aerobic   Tread Mill Gait trainer 1.6 mph x 61min with verbal cueing to equalize stance phase              Balance Exercises - 08/15/16 0927      Balance Exercises: Standing   Tandem Stance Eyes open;Foam/compliant surface;3 reps;30 secs   SLS Solid surface;Eyes open;3 reps;15 secs  Lt 7" , Rt 3"   Balance Beam 2RT forward and sidestepping on balance beam   Tandem  Gait 1 rep   Sidestepping 1 rep;Theraband  RTB   Step Over Hurdles / Cones forward and lateral navigation of 12 inch hurdles no HHA    Other Standing Exercises standing infront of wall for posture awarness with UE flexion 10x             PT Short Term Goals - 08/06/16 1001      PT SHORT TERM GOAL #1   Title Patient to be able to verbalize 5 ways to prevent a fall in order to improve general safety at home and in community    Time 3   Period Weeks   Status New     PT SHORT TERM GOAL #2   Title Patient to be able to stand for at least 15 minutes without holding onto objects around her and without fatigue to improve participation in chior at church    Time 3   Period Weeks   Status New     PT [place] #3   Title Patient to be compliant with correct performance of HEP, to be updated PRN    Time 1   Period  Weeks   Status New           PT Long Term Goals - 08/06/16 1002      PT LONG TERM GOAL #1   Title Patient to demonstrate functional strength as being 5/5 in order to improve balance and tolerance to functional activities    Time 6   Period Weeks   Status New     PT LONG TERM GOAL #2   Title Patient to be able to stand for 30 minutes without fatigue or needing to hold onto surrounding items in order to improve functional task participation and confidence    Time 6   Period Weeks   Status New     PT LONG TERM GOAL #3   Title Patient to score at least 22 on DGI in order to show improved balance and improved general safety    Time 6   Period Weeks   Status New     PT LONG TERM GOAL #4   Title Patient to be participatory in balance exercise program (cardio, flexibility, balance) in roder to improve general health status and prevent recurrence of condition    Time 6   Period Weeks   Status New               Plan - 08/15/16 1610    Clinical Impression Statement Session focus on balance training  with increased focus on dynamic surface.  Pt able to complete tandem gait on stable ground without difficulty, progressed to balance beam with min guard.  Pt wtih more difficulty holding static holds in tandem stance on foam.  Pt with tendency to rush through activiites, educated on slowing down to improve stability and balance.  Cueing through session to improve posture, pt educated on importance of posture to improve balance with reports of decreased neck pain     Rehab Potential Good   Clinical Impairments Affecting Rehab Potential (+) success with PT in the past, good general health; (-) return of condition    PT Frequency 2x / week   PT Duration 6 weeks   PT Treatment/Interventions ADLs/Self Care Home Management;DME Instruction;Gait training;Stair training;Functional mobility training;Therapeutic activities;Therapeutic exercise;Balance training;Neuromuscular  re-education;Patient/family education;Manual techniques   PT Next Visit Plan continue focus on balance and strength; continue gait training on TM and begin cone rotation next session  PT Home Exercise Plan Eval: bridges, sidelying hip ABD, hip hikes, tandem stance       Patient will benefit from skilled therapeutic intervention in order to improve the following deficits and impairments:  Abnormal gait, Decreased coordination, Decreased mobility, Decreased activity tolerance, Decreased strength, Decreased balance  Visit Diagnosis: Unsteadiness on feet  Muscle weakness (generalized)  Other symptoms and signs involving the musculoskeletal system     Problem List Patient Active Problem List   Diagnosis Date Noted  . Perforated diverticulitis 07/04/2012  . Osteopenia 07/04/2012  . Umbilical hernia 40/81/4481   Ihor Austin, LPTA; [place]  Aldona Lento 08/15/2016, 10:56 AM  [place], [place], [address] Phone: [phone]   Fax:  [phone]  Name: JANNETTA MASSEY MRN: [id number] Date of Birth: 04-16-1945

## 2016-08-19 ENCOUNTER — Encounter (HOSPITAL_COMMUNITY): Payer: Medicare HMO

## 2016-08-20 ENCOUNTER — Encounter (HOSPITAL_COMMUNITY): Payer: Medicare HMO | Admitting: Physical Therapy

## 2016-08-22 ENCOUNTER — Encounter (HOSPITAL_COMMUNITY): Payer: Medicare HMO | Admitting: Physical Therapy

## 2016-08-22 ENCOUNTER — Ambulatory Visit (HOSPITAL_COMMUNITY): Payer: Medicare HMO | Attending: Internal Medicine | Admitting: Physical Therapy

## 2016-08-22 DIAGNOSIS — M6281 Muscle weakness (generalized): Secondary | ICD-10-CM | POA: Diagnosis present

## 2016-08-22 DIAGNOSIS — R2681 Unsteadiness on feet: Secondary | ICD-10-CM | POA: Diagnosis present

## 2016-08-22 DIAGNOSIS — M79605 Pain in left leg: Secondary | ICD-10-CM | POA: Insufficient documentation

## 2016-08-22 DIAGNOSIS — R29898 Other symptoms and signs involving the musculoskeletal system: Secondary | ICD-10-CM | POA: Insufficient documentation

## 2016-08-22 NOTE — Therapy (Signed)
[place], [place], [address] Phone: [phone]   Fax:  [phone]  Physical Therapy Treatment  Patient Details  Name: Emma Henderson MRN: [id number] Date of Birth: 09-08-1945 Referring Provider: Asencion Noble   Encounter Date: 08/22/2016      PT End of Session - 08/22/16 0944    Visit Number 4   Number of Visits 13   Date for PT Re-Evaluation 08/27/16   Authorization Type [employer] Medicare HMO/BCBS state health/UHC Medicare    Authorization Time Period 08/06/16 to 09/17/16   Authorization - Visit Number 4   Authorization - Number of Visits 10   PT Start Time 0905   PT Stop Time 0943   PT Time Calculation (min) 38 min   Activity Tolerance Patient tolerated treatment well   Behavior During Therapy [facility] for tasks assessed/performed      Past Medical History:  Diagnosis Date  . Arthritis    "hands" (11/04/2012)  . Cataract   . Diverticulosis 2006  . XYIAXKPV(374.8)    "probably monthly" 11/04/2012)  . Osteopenia 05/2015   T score -1.6 FRAX 10%/1.6% stable from prior DEXA  . Perforated diverticulitis 07/04/2012  . Umbilical hernia 2/70/7867  . Unsteady gait   . Urinary incontinence     Past Surgical History:  Procedure Laterality Date  . CERVICAL SPINE SURGERY  ~ 1997  . COLON SURGERY    . HERNIA REPAIR  11/04/2012  . LAPAROSCOPIC PARTIAL COLECTOMY N/A 11/04/2012   Procedure: LAPAROSCOPIC SIGMOID COLECTOMY;  Surgeon: Harl Bowie, MD;  Location: [facility];  Service: General;  Laterality: N/A;  . LAPAROSCOPIC SIGMOID COLECTOMY  11/04/2012   ABSCESS DIVERTICULAR     . LUMBAR SPINE SURGERY  ~ 16  . PROCTOPLASTY N/A 11/04/2012   Procedure: RIGID PROCTOSCOPY;  Surgeon: Harl Bowie, MD;  Location: [facility];  Service: General;  Laterality: N/A;    There were no vitals filed for this visit.      Subjective Assessment - 08/22/16 0906    Subjective Patient arrives stating she was in a car a lot yesterday,  she is not feeling that good today due to this    Pertinent History hx cervical and lumbar surgeries, ostenopenia    Patient Stated Goals improve balance    Currently in Pain? Yes   Pain Score 3    Pain Location Neck   Pain Orientation Right                         [facility] Adult PT Treatment/Exercise - 08/22/16 0001      Knee/Hip Exercises: Standing   Heel Raises Both;1 set;20 reps   Heel Raises Limitations U heel raises, toe raises on incline    Forward Lunges Both;1 set;15 reps   Forward Lunges Limitations 4 inch box    Lateral Step Up Both;1 set;15 reps   Lateral Step Up Limitations 6 inch box B HHA    Forward Step Up Both;1 set;15 reps   Forward Step Up Limitations 6 inch box B HHA    Rocker Board 2 minutes   Rocker Board Limitations AP and lateral, B HHA    Other Standing Knee Exercises hip hikes 2x/15 with and without swing 2 second holds              Balance Exercises - 08/22/16 0939      Balance Exercises: Standing   Tandem Stance Eyes open;3 reps;25 secs  SLS Eyes open;Solid surface;3 reps;20 secs;Modified   Standing, One Foot on a Step Eyes open;Foam/compliant surface;6 inch;15 secs;2 reps           PT Education - 08/22/16 0944    Education provided Yes   Education Details increase frequency and progress HEP    Person(s) Educated Patient   Methods Explanation   Comprehension Verbalized understanding          PT Short Term Goals - 08/06/16 1001      PT SHORT TERM GOAL #1   Title Patient to be able to verbalize 5 ways to prevent a fall in order to improve general safety at home and in community    Time 3   Period Weeks   Status New     PT SHORT TERM GOAL #2   Title Patient to be able to stand for at least 15 minutes without holding onto objects around her and without fatigue to improve participation in chior at church    Time 3   Period Weeks   Status New     PT [place] #3   Title Patient to be compliant with correct  performance of HEP, to be updated PRN    Time 1   Period Weeks   Status New           PT Long Term Goals - 08/06/16 1002      PT LONG TERM GOAL #1   Title Patient to demonstrate functional strength as being 5/5 in order to improve balance and tolerance to functional activities    Time 6   Period Weeks   Status New     PT LONG TERM GOAL #2   Title Patient to be able to stand for 30 minutes without fatigue or needing to hold onto surrounding items in order to improve functional task participation and confidence    Time 6   Period Weeks   Status New     PT LONG TERM GOAL #3   Title Patient to score at least 22 on DGI in order to show improved balance and improved general safety    Time 6   Period Weeks   Status New     PT LONG TERM GOAL #4   Title Patient to be participatory in balance exercise program (cardio, flexibility, balance) in roder to improve general health status and prevent recurrence of condition    Time 6   Period Weeks   Status New               Plan - 08/22/16 0944    Clinical Impression Statement Continued with general focus on functional strength and balance today; patient continues to be quite challenged by balance based tasks as well as demonstrating quite a bit of functional weakness/poor functional activity tolerance with exercises today. Continue to recommend skilled PT services moving forward.    Rehab Potential Good   Clinical Impairments Affecting Rehab Potential (+) success with PT in the past, good general health; (-) return of condition    PT Frequency 2x / week   PT Duration 6 weeks   PT Treatment/Interventions ADLs/Self Care Home Management;DME Instruction;Gait training;Stair training;Functional mobility training;Therapeutic activities;Therapeutic exercise;Balance training;Neuromuscular re-education;Patient/family education;Manual techniques   PT Next Visit Plan continue focus on balance and strength; continue gait training on TM and  begin cone rotation next session. UPdate HEP    PT Home Exercise Plan Eval: bridges, sidelying hip ABD, hip hikes, tandem stance    Consulted and  Agree with Plan of Care Patient      Patient will benefit from skilled therapeutic intervention in order to improve the following deficits and impairments:  Abnormal gait, Decreased coordination, Decreased mobility, Decreased activity tolerance, Decreased strength, Decreased balance  Visit Diagnosis: Unsteadiness on feet  Muscle weakness (generalized)  Other symptoms and signs involving the musculoskeletal system     Problem List Patient Active Problem List   Diagnosis Date Noted  . Perforated diverticulitis 07/04/2012  . Osteopenia 07/04/2012  . Umbilical hernia 66/81/5947    Deniece Ree PT, DPT [place] [address] [place], [place], [address] Phone: [phone]   Fax:  [phone]  Name: Emma Henderson MRN: [id number] Date of Birth: 12/15/1945

## 2016-08-26 ENCOUNTER — Ambulatory Visit (HOSPITAL_COMMUNITY): Payer: Medicare HMO

## 2016-08-26 DIAGNOSIS — R29898 Other symptoms and signs involving the musculoskeletal system: Secondary | ICD-10-CM

## 2016-08-26 DIAGNOSIS — R2681 Unsteadiness on feet: Secondary | ICD-10-CM | POA: Diagnosis not present

## 2016-08-26 DIAGNOSIS — M6281 Muscle weakness (generalized): Secondary | ICD-10-CM

## 2016-08-26 NOTE — Therapy (Signed)
[place], [place], [address] Phone: [phone]   Fax:  [phone]  Physical Therapy Treatment  Patient Details  Name: Emma Henderson MRN: [id number] Date of Birth: 02/11/1945 Referring Provider: Asencion Noble   Encounter Date: 08/26/2016      PT End of Session - 08/26/16 0912    Visit Number 5   Number of Visits 13   Date for PT Re-Evaluation 08/27/16   Authorization Type A[employer]na Medicare HMO/BCBS state health/UHC Medicare    Authorization Time Period 08/06/16 to 09/17/16   Authorization - Visit Number 5   Authorization - Number of Visits 10   PT Start Time 0904   PT Stop Time 0949   PT Time Calculation (min) 45 min   Equipment Utilized During Treatment Gait belt   Activity Tolerance Patient tolerated treatment well;No increased pain;Patient limited by fatigue   Behavior During Therapy [facility] for tasks assessed/performed      Past Medical History:  Diagnosis Date  . Arthritis    "hands" (11/04/2012)  . Cataract   . Diverticulosis 2006  . CBJSEGBT(517.6)    "probably monthly" 11/04/2012)  . Osteopenia 05/2015   T score -1.6 FRAX 10%/1.6% stable from prior DEXA  . Perforated diverticulitis 07/04/2012  . Umbilical hernia 1/60/7371  . Unsteady gait   . Urinary incontinence     Past Surgical History:  Procedure Laterality Date  . CERVICAL SPINE SURGERY  ~ 1997  . COLON SURGERY    . HERNIA REPAIR  11/04/2012  . LAPAROSCOPIC PARTIAL COLECTOMY N/A 11/04/2012   Procedure: LAPAROSCOPIC SIGMOID COLECTOMY;  Surgeon: Harl Bowie, MD;  Location: [facility];  Service: General;  Laterality: N/A;  . LAPAROSCOPIC SIGMOID COLECTOMY  11/04/2012   ABSCESS DIVERTICULAR     . LUMBAR SPINE SURGERY  ~ 32  . PROCTOPLASTY N/A 11/04/2012   Procedure: RIGID PROCTOSCOPY;  Surgeon: Harl Bowie, MD;  Location: [facility];  Service: General;  Laterality: N/A;    There were no vitals filed for this visit.      Subjective  Assessment - 08/26/16 0909    Subjective Pt stated she is feeling good today, no reports of recent falls or LOB.  Reports minor pain in neck.     Pertinent History hx cervical and lumbar surgeries, ostenopenia    Patient Stated Goals improve balance    Currently in Pain? Yes   Pain Score 2    Pain Location Neck   Pain Orientation Right   Pain Type Chronic pain   Pain Onset In the past 7 days   Pain Frequency Constant   Aggravating Factors  not sure   Pain Relieving Factors some movement   Effect of Pain on Daily Activities annoying                         [facility] Adult PT Treatment/Exercise - 08/26/16 0001      Knee/Hip Exercises: Standing   Heel Raises Both;1 set;20 reps   Heel Raises Limitations U heel raises, toe raises on incline    Lateral Step Up Both;1 set;15 reps;Hand Hold: 1   Lateral Step Up Limitations 6 inch box B HHA    Forward Step Up Both;1 set;15 reps;Hand Hold: 1   Forward Step Up Limitations 6 inch box B HHA    Step Down Both;15 reps;Hand Hold: 1   Rocker Board 2 minutes   Rocker Board Limitations lateral B HHA  SLS Lt 7", Rt 4"             Balance Exercises - 08/26/16 0942      Balance Exercises: Standing   Tandem Stance Eyes open;Foam/compliant surface;2 reps;30 secs   SLS Eyes open;Solid surface;3 reps;20 secs;Modified   Cone Rotation Foam/compliant surface;Right turn;Left turn  NBOS on foam both directions             PT Short Term Goals - 08/06/16 1001      PT SHORT TERM GOAL #1   Title Patient to be able to verbalize 5 ways to prevent a fall in order to improve general safety at home and in community    Time 3   Period Weeks   Status New     PT SHORT TERM GOAL #2   Title Patient to be able to stand for at least 15 minutes without holding onto objects around her and without fatigue to improve participation in chior at church    Time 3   Period Weeks   Status New     PT [place] #3   Title Patient to be  compliant with correct performance of HEP, to be updated PRN    Time 1   Period Weeks   Status New           PT Long Term Goals - 08/06/16 1002      PT LONG TERM GOAL #1   Title Patient to demonstrate functional strength as being 5/5 in order to improve balance and tolerance to functional activities    Time 6   Period Weeks   Status New     PT LONG TERM GOAL #2   Title Patient to be able to stand for 30 minutes without fatigue or needing to hold onto surrounding items in order to improve functional task participation and confidence    Time 6   Period Weeks   Status New     PT LONG TERM GOAL #3   Title Patient to score at least 22 on DGI in order to show improved balance and improved general safety    Time 6   Period Weeks   Status New     PT LONG TERM GOAL #4   Title Patient to be participatory in balance exercise program (cardio, flexibility, balance) in roder to improve general health status and prevent recurrence of condition    Time 6   Period Weeks   Status New               Plan - 08/26/16 4270    Clinical Impression Statement Continued session focus wiht functional strengthening and balance training.  Added cone rotation to improve static balance on dynamic surface while reaching outside BOS.  Pt continues to demonstrate moderate difficulty with balance activities requiring assistance and poor activity tolerance requiring seated rest breaks due to fatigue.     Rehab Potential Good   Clinical Impairments Affecting Rehab Potential (+) success with PT in the past, good general health; (-) return of condition    PT Frequency 2x / week   PT Duration 6 weeks   PT Treatment/Interventions ADLs/Self Care Home Management;DME Instruction;Gait training;Stair training;Functional mobility training;Therapeutic activities;Therapeutic exercise;Balance training;Neuromuscular re-education;Patient/family education;Manual techniques   PT Next Visit Plan continue focus on balance  and strength; continue gait training on TM and begin vector stance next session   PT Home Exercise Plan Eval: bridges, sidelying hip ABD, hip hikes, tandem stance; 08/26/16: SLS  Patient will benefit from skilled therapeutic intervention in order to improve the following deficits and impairments:  Abnormal gait, Decreased coordination, Decreased mobility, Decreased activity tolerance, Decreased strength, Decreased balance  Visit Diagnosis: Unsteadiness on feet  Muscle weakness (generalized)  Other symptoms and signs involving the musculoskeletal system     Problem List Patient Active Problem List   Diagnosis Date Noted  . Perforated diverticulitis 07/04/2012  . Osteopenia 07/04/2012  . Umbilical hernia 44/03/4740   Ihor Austin, LPTA; [place]  Aldona Lento 08/26/2016, 10:02 AM  [place], [place], [address] Phone: [phone]   Fax:  [phone]  Name: Emma Henderson MRN: [id number] Date of Birth: 1945-05-08

## 2016-08-26 NOTE — Patient Instructions (Signed)
POSITION: Single Leg Balance: Neck Laterally Flexed / Stance Side    Stand on right leg. Tilt head to stance side. Hold 30 seconds. 5 reps 1-2 times per day.  http://ggbe.exer.us/15   Copyright  VHI. All rights reserved.

## 2016-08-27 ENCOUNTER — Encounter (HOSPITAL_COMMUNITY): Payer: Medicare HMO

## 2016-08-29 ENCOUNTER — Encounter (HOSPITAL_COMMUNITY): Payer: Medicare HMO | Admitting: Physical Therapy

## 2016-08-29 ENCOUNTER — Ambulatory Visit (HOSPITAL_COMMUNITY): Payer: Medicare HMO | Admitting: Physical Therapy

## 2016-08-29 DIAGNOSIS — M6281 Muscle weakness (generalized): Secondary | ICD-10-CM

## 2016-08-29 DIAGNOSIS — R2681 Unsteadiness on feet: Secondary | ICD-10-CM | POA: Diagnosis not present

## 2016-08-29 DIAGNOSIS — R29898 Other symptoms and signs involving the musculoskeletal system: Secondary | ICD-10-CM

## 2016-08-29 NOTE — Therapy (Signed)
[place], [place], [address] Phone: [phone]   Fax:  [phone]  Physical Therapy Treatment (Re-Assessment)  Patient Details  Name: Emma Henderson MRN: [id number] Date of Birth: March 27, 1945 Referring Provider: Asencion Noble   Encounter Date: 08/29/2016      PT End of Session - 08/29/16 0941    Visit Number 6   Number of Visits 9   Date for PT Re-Evaluation 09/19/16   Authorization Type [employer]na Medicare HMO/BCBS state health/UHC Medicare  (G-codes done 6th session)   Authorization Time Period 08/06/16 to 09/17/16   Authorization - Visit Number 6   Authorization - Number of Visits 16   PT Start Time 0900   PT Stop Time 0938   PT Time Calculation (min) 38 min   Equipment Utilized During Treatment Gait belt   Activity Tolerance Patient tolerated treatment well   Behavior During Therapy [facility] for tasks assessed/performed      Past Medical History:  Diagnosis Date  . Arthritis    "hands" (11/04/2012)  . Cataract   . Diverticulosis 2006  . KVQQVZDG(387.5)    "probably monthly" 11/04/2012)  . Osteopenia 05/2015   T score -1.6 FRAX 10%/1.6% stable from prior DEXA  . Perforated diverticulitis 07/04/2012  . Umbilical hernia 6/43/3295  . Unsteady gait   . Urinary incontinence     Past Surgical History:  Procedure Laterality Date  . CERVICAL SPINE SURGERY  ~ 1997  . COLON SURGERY    . HERNIA REPAIR  11/04/2012  . LAPAROSCOPIC PARTIAL COLECTOMY N/A 11/04/2012   Procedure: LAPAROSCOPIC SIGMOID COLECTOMY;  Surgeon: Harl Bowie, MD;  Location: [place];  Service: General;  Laterality: N/A;  . LAPAROSCOPIC SIGMOID COLECTOMY  11/04/2012   ABSCESS DIVERTICULAR     . LUMBAR SPINE SURGERY  ~ 74  . PROCTOPLASTY N/A 11/04/2012   Procedure: RIGID PROCTOSCOPY;  Surgeon: Harl Bowie, MD;  Location: [place];  Service: General;  Laterality: N/A;    There were no vitals filed for this visit.      Subjective  Assessment - 08/29/16 0902    Subjective Patient states that her neck is still bothering her, she has still not talked to her MD about her neck yet. She states that in terms of her strength and balacne she feels like she is only making slow progress. She is unsure what she would rate herself on a 0-100 scale.    Pertinent History hx cervical and lumbar surgeries, ostenopenia    Patient Stated Goals improve balance    Currently in Pain? Yes   Pain Score 3    Pain Location Neck   Pain Orientation Right   Pain Descriptors / Indicators Aching   Pain Type Chronic pain   Pain Radiating Towards headache sometimes    Pain Onset In the past 7 days   Pain Frequency Constant   Aggravating Factors  not sure    Pain Relieving Factors some movement    Effect of Pain on Daily Activities annoying             [facility] PT Assessment - 08/29/16 0001      Observation/Other Assessments   Observations improved, very mild unsteadiness but significant fiatuge noted after 5 minutes, knee hyperextension      Strength   Right Hip Flexion 4/5   Right Hip Extension 4+/5   Right Hip ABduction 4/5   Left Hip Flexion 5/5   Left Hip Extension 4+/5  Left Hip ABduction 4-/5   Right Knee Flexion 5/5   Right Knee Extension 5/5   Left Knee Flexion 5/5   Left Knee Extension 5/5   Right Ankle Dorsiflexion 5/5   Left Ankle Dorsiflexion 5/5     Dynamic Gait Index   Level Surface Normal   Change in Gait Speed Normal   Gait with Horizontal Head Turns Normal   Gait with Vertical Head Turns Normal   Gait and Pivot Turn Normal   Step Over Obstacle Normal   Step Around Obstacles Mild Impairment   Steps Normal   Total Score 23   DGI comment: 96%                             PT Education - 08/29/16 0940    Education provided Yes   Education Details progress with PT, goals yet to meet moving forward, POC; specificity of training and how to improve still weak areas; safety education/fall  prevention; basic education regarding resting and target HR and how to find on body; POC moving forward    Person(s) Educated Patient   Methods Explanation   Comprehension Verbalized understanding          PT Short Term Goals - 08/29/16 0924      PT SHORT TERM GOAL #1   Title Patient to be able to verbalize 5 ways to prevent a fall in order to improve general safety at home and in community    Baseline 8/9- 0/5   Time 3   Period Weeks   Status On-going     PT SHORT TERM GOAL #2   Title Patient to be able to stand for at least 15 minutes without holding onto objects around her and without fatigue to improve participation in chior at church    Baseline 8/9- fatigued after 5 minutes, states she might be able to do 7    Time 3   Period Weeks   Status On-going     PT SHORT TERM GOAL #3   Title Patient to be compliant with correct performance of HEP, to be updated PRN    Baseline 8/9- compliant    Time 1   Period Weeks   Status Achieved           PT Long Term Goals - 08/29/16 0931      PT LONG TERM GOAL #1   Title Patient to demonstrate functional strength as being 5/5 in order to improve balance and tolerance to functional activities    Baseline 8/9- improving but has not met goal yet    Time 6   Period Weeks   Status Partially Met     PT LONG TERM GOAL #2   Title Patient to be able to stand for 30 minutes without fatigue or needing to hold onto surrounding items in order to improve functional task participation and confidence    Baseline 8/9- 5-7 minutes    Time 6   Period Weeks   Status On-going     PT LONG TERM GOAL #3   Title Patient to score at least 22 on DGI in order to show improved balance and improved general safety    Baseline 8/9- 23    Time 6   Period Weeks   Status Achieved     PT LONG TERM GOAL #4   Title Patient to be participatory in balance exercise program (cardio, flexibility, balance) in [place] to improve general  health status and prevent  recurrence of condition    Baseline 09-14-22- not started                Plan - 09/13/2016 1749    Clinical Impression Statement Re-assessment performed today. Patient appears to be making progress with skilled PT services but continues to demonstrate mild functional strength and balance deficits moving forward, note that she also demonstrates considerable muscle endurance deficits in B LEs as evidenced by difficulty with fatigue and shaking during static stance. Recommend short continuation of skilled PT services to address functional deficits moving forward.    Rehab Potential Good   Clinical Impairments Affecting Rehab Potential (+) success with PT in the past, good general health; (-) return of condition    PT Frequency Other (comment)  3 more visits then mini-reassess    PT Duration Other (comment)  3 more visits then mini-reassess    PT Treatment/Interventions ADLs/Self Care Home Management;DME Instruction;Gait training;Stair training;Functional mobility training;Therapeutic activities;Therapeutic exercise;Balance training;Neuromuscular re-education;Patient/family education;Manual techniques   PT Next Visit Plan continue focus on balance and strength; continue gait training on TM and begin vector stance next session. Update HEP next session. Mini reassess 8/30   PT Home Exercise Plan Eval: bridges, sidelying hip ABD, hip hikes, tandem stance; 08/26/16: SLS September 14, 2022 endurance stands progressive version    Consulted and Agree with Plan of Care Patient      Patient will benefit from skilled therapeutic intervention in order to improve the following deficits and impairments:  Abnormal gait, Decreased coordination, Decreased mobility, Decreased activity tolerance, Decreased strength, Decreased balance  Visit Diagnosis: Unsteadiness on feet  Muscle weakness (generalized)  Other symptoms and signs involving the musculoskeletal system       G-Codes - 2016/09/13 0944    Functional Assessment  Tool Used (Outpatient Only) Based on skilled PT assessment of strength, gait, balance, fall risk    Functional Limitation Mobility: Walking and moving around   Mobility: Walking and Moving Around Current Status (S4967) At least 1 percent but less than 20 percent impaired, limited or restricted   Mobility: Walking and Moving Around Goal Status [phone]) At least 1 percent but less than 20 percent impaired, limited or restricted      Problem List Patient Active Problem List   Diagnosis Date Noted  . Perforated diverticulitis 07/04/2012  . Osteopenia 07/04/2012  . Umbilical hernia 84/66/5993    Deniece Ree PT, DPT [place] [address] [place], [place], [address] Phone: [phone]   Fax:  [phone]  Name: Emma Henderson MRN: [id number] Date of Birth: Jan 23, 1945

## 2016-09-02 ENCOUNTER — Encounter (HOSPITAL_COMMUNITY): Payer: Medicare HMO | Admitting: Physical Therapy

## 2016-09-03 ENCOUNTER — Encounter (HOSPITAL_COMMUNITY): Payer: Medicare HMO | Admitting: Physical Therapy

## 2016-09-05 ENCOUNTER — Encounter (HOSPITAL_COMMUNITY): Payer: Medicare HMO

## 2016-09-09 ENCOUNTER — Ambulatory Visit (HOSPITAL_COMMUNITY): Payer: Medicare HMO | Admitting: Physical Therapy

## 2016-09-09 DIAGNOSIS — R2681 Unsteadiness on feet: Secondary | ICD-10-CM | POA: Diagnosis not present

## 2016-09-09 DIAGNOSIS — M6281 Muscle weakness (generalized): Secondary | ICD-10-CM

## 2016-09-09 DIAGNOSIS — R29898 Other symptoms and signs involving the musculoskeletal system: Secondary | ICD-10-CM

## 2016-09-09 NOTE — Therapy (Signed)
[place], [place], [address] Phone: [phone]   Fax:  [phone]  Physical Therapy Treatment  Patient Details  Name: Emma Henderson MRN: [id number] Date of Birth: September 15, 1945 Referring Provider: Asencion Noble   Encounter Date: 09/09/2016      PT End of Session - 09/09/16 0945    Visit Number 7   Number of Visits 9   Date for PT Re-Evaluation 09/19/16   Authorization Type [employer]na Medicare HMO/BCBS state health/UHC Medicare  (G-codes done 6th session)   Authorization Time Period 08/06/16 to 09/17/16   Authorization - Visit Number 7   Authorization - Number of Visits 16   PT Start Time 0902   PT Stop Time 0942   PT Time Calculation (min) 40 min   Activity Tolerance Patient tolerated treatment well   Behavior During Therapy [facility] for tasks assessed/performed      Past Medical History:  Diagnosis Date  . Arthritis    "hands" (11/04/2012)  . Cataract   . Diverticulosis 2006  . BSJGGEZM(629.4)    "probably monthly" 11/04/2012)  . Osteopenia 05/2015   T score -1.6 FRAX 10%/1.6% stable from prior DEXA  . Perforated diverticulitis 07/04/2012  . Umbilical hernia 7/65/4650  . Unsteady gait   . Urinary incontinence     Past Surgical History:  Procedure Laterality Date  . CERVICAL SPINE SURGERY  ~ 1997  . COLON SURGERY    . HERNIA REPAIR  11/04/2012  . LAPAROSCOPIC PARTIAL COLECTOMY N/A 11/04/2012   Procedure: LAPAROSCOPIC SIGMOID COLECTOMY;  Surgeon: Harl Bowie, MD;  Location: [place];  Service: General;  Laterality: N/A;  . LAPAROSCOPIC SIGMOID COLECTOMY  11/04/2012   ABSCESS DIVERTICULAR     . LUMBAR SPINE SURGERY  ~ 28  . PROCTOPLASTY N/A 11/04/2012   Procedure: RIGID PROCTOSCOPY;  Surgeon: Harl Bowie, MD;  Location: [place];  Service: General;  Laterality: N/A;    There were no vitals filed for this visit.      Subjective Assessment - 09/09/16 0904    Subjective Patient arrives stating she  had a great time at camp, she had a hard time keeping up with her her HEP during camp but did manage to do it 1x/day most days. She states she was walking upwards of 20,000 steps per day and was very active overall.    Pertinent History hx cervical and lumbar surgeries, ostenopenia    Patient Stated Goals improve balance    Currently in Pain? Yes   Pain Score 3    Pain Location Neck   Pain Orientation Right   Pain Descriptors / Indicators Aching   Pain Type Chronic pain   Pain Radiating Towards sometimes HA    Pain Onset In the past 7 days   Pain Frequency Constant   Aggravating Factors  not sure    Pain Relieving Factors some movement    Effect of Pain on Daily Activities annoying                          [facility] Adult PT Treatment/Exercise - 09/09/16 0001      Knee/Hip Exercises: Standing   Forward Lunges Both;1 set;10 reps   Forward Lunges Limitations floor    Other Standing Knee Exercises half squat holds x15 with 10 second holds and RTB around LEs for 2nd half of set    Other Standing Knee Exercises 3 way hip holds x10 each side  Knee/Hip Exercises: Seated   Other Seated Knee/Hip Exercises stool scoots approx 254f hams and quads              Balance Exercises - 09/09/16 0930      Balance Exercises: Standing   Tandem Stance Eyes open;Foam/compliant surface;3 reps;Other (comment)  UE bar raise x5 each set    SLS Eyes open;Solid surface;3 reps;10 secs  knee bent, cues to avoid knee hyperextension    Other Standing Exercises oppostie/alternate UE/LE raises with knee bent 2x10; tandem stance 4x30 seconds bodyblade vertical and horizontal            PT Education - 09/09/16 0943    Education provided Yes   Education Details encouraged to increase frequency and reps of exercise    Person(s) Educated Patient   Methods Explanation   Comprehension Verbalized understanding          PT Short Term Goals - 08/29/16 0924      PT SHORT TERM GOAL  #1   Title Patient to be able to verbalize 5 ways to prevent a fall in order to improve general safety at home and in community    Baseline 8/9- 0/5   Time 3   Period Weeks   Status On-going     PT SHORT TERM GOAL #2   Title Patient to be able to stand for at least 15 minutes without holding onto objects around her and without fatigue to improve participation in chior at church    Baseline 8/9- fatigued after 5 minutes, states she might be able to do 7    Time 3   Period Weeks   Status On-going     PT SHORT TERM GOAL #3   Title Patient to be compliant with correct performance of HEP, to be updated PRN    Baseline 8/9- compliant    Time 1   Period Weeks   Status Achieved           PT Long Term Goals - 08/29/16 0931      PT LONG TERM GOAL #1   Title Patient to demonstrate functional strength as being 5/5 in order to improve balance and tolerance to functional activities    Baseline 8/9- improving but has not met goal yet    Time 6   Period Weeks   Status Partially Met     PT LONG TERM GOAL #2   Title Patient to be able to stand for 30 minutes without fatigue or needing to hold onto surrounding items in order to improve functional task participation and confidence    Baseline 8/9- 5-7 minutes    Time 6   Period Weeks   Status On-going     PT LONG TERM GOAL #3   Title Patient to score at least 22 on DGI in order to show improved balance and improved general safety    Baseline 8/9- 23    Time 6   Period Weeks   Status Achieved     PT LONG TERM GOAL #4   Title Patient to be participatory in balance exercise program (cardio, flexibility, balance) in [place] to improve general health status and prevent recurrence of condition    Baseline 8/9- not started                Plan - 09/09/16 0945    Clinical Impression Statement Patient arrives stating she had a great time at camp but was only able to do her HEP about 1x/day. Continued working  on functional strength and  balance as well as increased work on endurance based exercises for LE musculature today. Patient continues to do well and is very pleasant but continues to demonstrate functional deficits moving forward. Noted shift off of R LE with squat holds requiring cues to correct as well as tendency to hyperextend LEs with functional tasks. Patient limited by fatigue this session.    Rehab Potential Good   Clinical Impairments Affecting Rehab Potential (+) success with PT in the past, good general health; (-) return of condition    PT Frequency Other (comment)  2 more sessions then mini-reassess    PT Duration Other (comment)  2 more sessions then mini-reassess    PT Treatment/Interventions ADLs/Self Care Home Management;DME Instruction;Gait training;Stair training;Functional mobility training;Therapeutic activities;Therapeutic exercise;Balance training;Neuromuscular re-education;Patient/family education;Manual techniques   PT Next Visit Plan continue working on balance/strength with focus on muscle endurance and avoiding LE hyperextension. Mini-reassess 8/30. Update HEP.    PT Home Exercise Plan Eval: bridges, sidelying hip ABD, hip hikes, tandem stance; 08/26/16: SLS 8/9 endurance stands progressive version    Consulted and Agree with Plan of Care Patient      Patient will benefit from skilled therapeutic intervention in order to improve the following deficits and impairments:  Abnormal gait, Decreased coordination, Decreased mobility, Decreased activity tolerance, Decreased strength, Decreased balance  Visit Diagnosis: Unsteadiness on feet  Muscle weakness (generalized)  Other symptoms and signs involving the musculoskeletal system     Problem List Patient Active Problem List   Diagnosis Date Noted  . Perforated diverticulitis 07/04/2012  . Osteopenia 07/04/2012  . Umbilical hernia 06/84/0335    Deniece Ree PT, DPT [place] [address] [place], [place], [address] Phone: [phone]   Fax:  [phone]  Name: Emma Henderson MRN: [id number] Date of Birth: 08-10-1945

## 2016-09-10 ENCOUNTER — Telehealth (HOSPITAL_COMMUNITY): Payer: Self-pay | Admitting: Internal Medicine

## 2016-09-10 ENCOUNTER — Encounter (HOSPITAL_COMMUNITY): Payer: Medicare HMO

## 2016-09-10 NOTE — Telephone Encounter (Signed)
09/10/16  patient came by and wanted to change her appt. to 8/24

## 2016-09-12 ENCOUNTER — Encounter (HOSPITAL_COMMUNITY): Payer: Medicare HMO | Admitting: Physical Therapy

## 2016-09-12 ENCOUNTER — Ambulatory Visit (HOSPITAL_COMMUNITY): Payer: Medicare HMO

## 2016-09-13 ENCOUNTER — Ambulatory Visit (HOSPITAL_COMMUNITY): Payer: Medicare HMO | Admitting: Physical Therapy

## 2016-09-13 DIAGNOSIS — M6281 Muscle weakness (generalized): Secondary | ICD-10-CM

## 2016-09-13 DIAGNOSIS — R2681 Unsteadiness on feet: Secondary | ICD-10-CM

## 2016-09-13 DIAGNOSIS — R29898 Other symptoms and signs involving the musculoskeletal system: Secondary | ICD-10-CM

## 2016-09-13 DIAGNOSIS — M79605 Pain in left leg: Secondary | ICD-10-CM

## 2016-09-13 NOTE — Therapy (Signed)
[place], [place], [address] Phone: [phone]   Fax:  [phone]  Physical Therapy Treatment  Patient Details  Name: Emma Henderson MRN: [id number] Date of Birth: November 05, 1945 Referring Provider: Asencion Noble   Encounter Date: 09/13/2016      PT End of Session - 09/13/16 1515    Visit Number 8   Number of Visits 9   Date for PT Re-Evaluation 09/19/16   Authorization Type [employer]na Medicare HMO/BCBS state health/UHC Medicare  (G-codes done 6th session)   Authorization Time Period 08/06/16 to 09/17/16   Authorization - Visit Number 8   Authorization - Number of Visits 16   PT Start Time 9574   PT Stop Time 1517   PT Time Calculation (min) 38 min   Activity Tolerance Patient tolerated treatment well   Behavior During Therapy [facility] for tasks assessed/performed      Past Medical History:  Diagnosis Date  . Arthritis    "hands" (11/04/2012)  . Cataract   . Diverticulosis 2006  . BBUYZJQD(643.8)    "probably monthly" 11/04/2012)  . Osteopenia 05/2015   T score -1.6 FRAX 10%/1.6% stable from prior DEXA  . Perforated diverticulitis 07/04/2012  . Umbilical hernia 3/81/8403  . Unsteady gait   . Urinary incontinence     Past Surgical History:  Procedure Laterality Date  . CERVICAL SPINE SURGERY  ~ 1997  . COLON SURGERY    . HERNIA REPAIR  11/04/2012  . LAPAROSCOPIC PARTIAL COLECTOMY N/A 11/04/2012   Procedure: LAPAROSCOPIC SIGMOID COLECTOMY;  Surgeon: Harl Bowie, MD;  Location: [place];  Service: General;  Laterality: N/A;  . LAPAROSCOPIC SIGMOID COLECTOMY  11/04/2012   ABSCESS DIVERTICULAR     . LUMBAR SPINE SURGERY  ~ 85  . PROCTOPLASTY N/A 11/04/2012   Procedure: RIGID PROCTOSCOPY;  Surgeon: Harl Bowie, MD;  Location: [place];  Service: General;  Laterality: N/A;    There were no vitals filed for this visit.      Subjective Assessment - 09/13/16 1435    Subjective Pt has no complaints    Pertinent History hx cervical and lumbar surgeries, ostenopenia    Patient Stated Goals improve balance    Currently in Pain? No/denies   Pain Onset In the past 7 days                              Balance Exercises - 09/13/16 1435      Balance Exercises: Standing   Tandem Stance Eyes open;Foam/compliant surface;3 reps;Other (comment)  head turns x 5/head nods x 5   SLS with Vectors Solid surface;3 reps;10 secs   Rockerboard Anterior/posterior;Lateral;Other time (comment)  x 1 minute each     Balance Beam over hurdles x 2 RT    Tandem Gait Forward;Retro;Foam/compliant surface;2 reps   Sidestepping Foam/compliant support;2 reps   Toe Raise Limitations sitting on ball LAQ and hip flexion x 10  each              PT Short Term Goals - 08/29/16 0924      PT SHORT TERM GOAL #1   Title Patient to be able to verbalize 5 ways to prevent a fall in order to improve general safety at home and in community    Baseline 8/9- 0/5   Time 3   Period Weeks   Status On-going     PT SHORT TERM GOAL #2  Title Patient to be able to stand for at least 15 minutes without holding onto objects around her and without fatigue to improve participation in chior at church    Baseline 8/9- fatigued after 5 minutes, states she might be able to do 7    Time 3   Period Weeks   Status On-going     PT SHORT TERM GOAL #3   Title Patient to be compliant with correct performance of HEP, to be updated PRN    Baseline 8/9- compliant    Time 1   Period Weeks   Status Achieved           PT Long Term Goals - 08/29/16 0931      PT LONG TERM GOAL #1   Title Patient to demonstrate functional strength as being 5/5 in order to improve balance and tolerance to functional activities    Baseline 8/9- improving but has not met goal yet    Time 6   Period Weeks   Status Partially Met     PT LONG TERM GOAL #2   Title Patient to be able to stand for 30 minutes without fatigue or needing  to hold onto surrounding items in order to improve functional task participation and confidence    Baseline 8/9- 5-7 minutes    Time 6   Period Weeks   Status On-going     PT LONG TERM GOAL #3   Title Patient to score at least 22 on DGI in order to show improved balance and improved general safety    Baseline 8/9- 23    Time 6   Period Weeks   Status Achieved     PT LONG TERM GOAL #4   Title Patient to be participatory in balance exercise program (cardio, flexibility, balance) in [place] to improve general health status and prevent recurrence of condition    Baseline 8/9- not started                Plan - 09/13/16 1516    Clinical Impression Statement Added vector stances and sitting ball exercises to engage core.  Pt needed constant verbal cuing and moderate assist with most exercises to keep her balance.    Rehab Potential Good   Clinical Impairments Affecting Rehab Potential (+) success with PT in the past, good general health; (-) return of condition    PT Frequency Other (comment)  2 more sessions then mini-reassess    PT Duration Other (comment)  2 more sessions then mini-reassess    PT Treatment/Interventions ADLs/Self Care Home Management;DME Instruction;Gait training;Stair training;Functional mobility training;Therapeutic activities;Therapeutic exercise;Balance training;Neuromuscular re-education;Patient/family education;Manual techniques   PT Next Visit Plan Reassess pt    PT Home Exercise Plan Eval: bridges, sidelying hip ABD, hip hikes, tandem stance; 08/26/16: SLS 8/9 endurance stands progressive version    Consulted and Agree with Plan of Care Patient      Patient will benefit from skilled therapeutic intervention in order to improve the following deficits and impairments:  Abnormal gait, Decreased coordination, Decreased mobility, Decreased activity tolerance, Decreased strength, Decreased balance  Visit Diagnosis: Unsteadiness on feet  Muscle weakness  (generalized)  Other symptoms and signs involving the musculoskeletal system  Pain in left leg     Problem List Patient Active Problem List   Diagnosis Date Noted  . Perforated diverticulitis 07/04/2012  . Osteopenia 07/04/2012  . Umbilical hernia 56/31/4970    Rayetta Humphrey, PT CLT [phone] 09/13/2016, 3:17 PM  [place], [place], [address] Phone: [phone]   Fax:  [phone]  Name: Emma Henderson MRN: [id number] Date of Birth: [birth year]

## 2016-09-16 ENCOUNTER — Encounter (HOSPITAL_COMMUNITY): Payer: Medicare HMO | Admitting: Physical Therapy

## 2016-09-17 ENCOUNTER — Encounter (HOSPITAL_COMMUNITY): Payer: Medicare HMO | Admitting: Physical Therapy

## 2016-09-19 ENCOUNTER — Ambulatory Visit (HOSPITAL_COMMUNITY): Payer: Medicare HMO | Admitting: Physical Therapy

## 2016-09-19 ENCOUNTER — Encounter (HOSPITAL_COMMUNITY): Payer: Medicare HMO | Admitting: Physical Therapy

## 2016-09-19 DIAGNOSIS — M6281 Muscle weakness (generalized): Secondary | ICD-10-CM

## 2016-09-19 DIAGNOSIS — R2681 Unsteadiness on feet: Secondary | ICD-10-CM

## 2016-09-19 DIAGNOSIS — R29898 Other symptoms and signs involving the musculoskeletal system: Secondary | ICD-10-CM

## 2016-09-19 NOTE — Therapy (Signed)
[place], [place], [address] Phone: [phone]   Fax:  [phone]  Physical Therapy Treatment (Discharge)  Patient Details  Name: Emma Henderson MRN: [id number] Date of Birth: 04-24-1945 Referring Provider: Asencion Noble   Encounter Date: 09/19/2016      PT End of Session - 09/19/16 1213    Visit Number 9   Number of Visits 9   Authorization Type Aetna Medicare HMO/BCBS state health/UHC Medicare  (G-codes done 9th session)   Authorization Time Period 3/97/67 to 3/41/93; recert done 7/90   Authorization - Visit Number 9   Authorization - Number of Visits 19   PT Start Time 0901   PT Stop Time 0940   PT Time Calculation (min) 39 min   Equipment Utilized During Treatment Gait belt   Activity Tolerance Patient tolerated treatment well   Behavior During Therapy [facility] for tasks assessed/performed      Past Medical History:  Diagnosis Date  . Arthritis    "hands" (11/04/2012)  . Cataract   . Diverticulosis 2006  . WIOXBDZH(299.2)    "probably monthly" 11/04/2012)  . Osteopenia 05/2015   T score -1.6 FRAX 10%/1.6% stable from prior DEXA  . Perforated diverticulitis 07/04/2012  . Umbilical hernia 05/17/8339  . Unsteady gait   . Urinary incontinence     Past Surgical History:  Procedure Laterality Date  . CERVICAL SPINE SURGERY  ~ 1997  . COLON SURGERY    . HERNIA REPAIR  11/04/2012  . LAPAROSCOPIC PARTIAL COLECTOMY N/A 11/04/2012   Procedure: LAPAROSCOPIC SIGMOID COLECTOMY;  Surgeon: Harl Bowie, MD;  Location: [place];  Service: General;  Laterality: N/A;  . LAPAROSCOPIC SIGMOID COLECTOMY  11/04/2012   ABSCESS DIVERTICULAR     . LUMBAR SPINE SURGERY  ~ 13  . PROCTOPLASTY N/A 11/04/2012   Procedure: RIGID PROCTOSCOPY;  Surgeon: Harl Bowie, MD;  Location: [place];  Service: General;  Laterality: N/A;    There were no vitals filed for this visit.      Subjective Assessment - 09/19/16 0903    Subjective Patient arrives stating she feels fine, her neck still hurts but she still has not talked to her MD regarding her neck however. She is still worried about her balance. She rates herself as being 85% with her neck pain and her balance being her main concerns.    Pertinent History hx cervical and lumbar surgeries, ostenopenia    Patient Stated Goals improve balance    Currently in Pain? Yes   Pain Score 3    Pain Location Neck   Pain Orientation Right   Pain Descriptors / Indicators Aching   Pain Type Chronic pain   Pain Onset In the past 7 days   Pain Frequency Constant   Aggravating Factors  turning head certain ways    Pain Relieving Factors nothing    Effect of Pain on Daily Activities annoying             [facility] PT Assessment - 09/19/16 0001      Assessment   Medical Diagnosis gait/imbalance    Referring Provider Asencion Noble    Onset Date/Surgical Date --  chronic    Next MD Visit Dr. Willey Blade next year    Prior Therapy PT last year      Precautions   Precautions Fall     Balance Screen   Has the patient fallen in the past 6 months No   Has the patient  had a decrease in activity level because of a fear of falling?  Yes   Is the patient reluctant to leave their home because of a fear of falling?  No     Prior Function   Level of Independence Independent;Independent with gait;Independent with transfers   Vocation Retired     Observation/Other Assessments   Observations about 5 minutes beofre shaking and fatigue, knee hyperextension      Strength   Right Hip Flexion 4-/5   Right Hip Extension 5/5   Right Hip ABduction 4+/5   Left Hip Flexion 5/5   Left Hip Extension 5/5   Left Hip ABduction 4+/5   Right Knee Flexion 5/5   Right Knee Extension 4+/5   Left Knee Flexion 5/5   Left Knee Extension 4+/5   Right Ankle Dorsiflexion 5/5   Left Ankle Dorsiflexion 5/5     Dynamic Gait Index   Level Surface Normal   Change in Gait Speed Normal   Gait with  Horizontal Head Turns Normal   Gait with Vertical Head Turns Normal   Gait and Pivot Turn Normal   Step Over Obstacle Normal   Step Around Obstacles Normal   Steps Mild Impairment   Total Score 23   DGI comment: 96%                          Balance Exercises - 09/19/16 0937      Balance Exercises: Standing   Standing Eyes Closed Narrow base of support (BOS);Solid surface;1 rep;20 secs   Tandem Stance Eyes open;1 rep;15 secs   Tandem Gait Forward;4 reps  approx 70f    Retro Gait 4 reps  approx 822f           PT Education - 09/19/16 1213    Education provided Yes   Education Details progress towards goals/objective findings this session, updated balance HEP, DC today   Person(s) Educated Patient   Methods Explanation;Demonstration;Handout   Comprehension Verbalized understanding;Returned demonstration          PT Short Term Goals - 08/29/16 0924      PT SHORT TERM GOAL #1   Title Patient to be able to verbalize 5 ways to prevent a fall in order to improve general safety at home and in community    Baseline 8/9- 0/5   Time 3   Period Weeks   Status On-going     PT SHORT TERM GOAL #2   Title Patient to be able to stand for at least 15 minutes without holding onto objects around her and without fatigue to improve participation in [employer] at [employer]    Baseline 8/9- fatigued after 5 minutes, states she might be able to do 7    Time 3   Period Weeks   Status On-going     PT SHORT TERM GOAL #3   Title Patient to be compliant with correct performance of HEP, to be updated PRN    Baseline 8/9- compliant    Time 1   Period Weeks   Status Achieved           PT Long Term Goals - 08/29/16 0931      PT LONG TERM GOAL #1   Title Patient to demonstrate functional strength as being 5/5 in order to improve balance and tolerance to functional activities    Baseline 8/9- improving but has not met goal yet    Time 6   Period Weeks  Status Partially  Met     PT LONG TERM GOAL #2   Title Patient to be able to stand for 30 minutes without fatigue or needing to hold onto surrounding items in order to improve functional task participation and confidence    Baseline 8/9- 5-7 minutes    Time 6   Period Weeks   Status On-going     PT LONG TERM GOAL #3   Title Patient to score at least 22 on DGI in order to show improved balance and improved general safety    Baseline 8/9- 23    Time 6   Period Weeks   Status Achieved     PT LONG TERM GOAL #4   Title Patient to be participatory in balance exercise program (cardio, flexibility, balance) in [place] to improve general health status and prevent recurrence of condition    Baseline 8/9- not started                Plan - 09/26/16 1214    Clinical Impression Statement Re-assessment performed today. Patient does not appear to have made significant functional progress since the time of last re-assess, and regardless continues to score quite high on objective testing regardless. She continues to have neck pain but has not spoken to her MD about this for possible referral yet. Practiced and discussed updated HEP including more challenging balance activities today. Recommend DC to advanced HEP and home exercise program at this point.    Rehab Potential Good   Clinical Impairments Affecting Rehab Potential (+) success with PT in the past, good general health; (-) return of condition    PT Next Visit Plan DC today    PT Home Exercise Plan Eval: bridges, sidelying hip ABD, hip hikes, tandem stance; 08/26/16: SLS 8/9 endurance stands progressive version 09/27/2022: tandem stance, tandem gait, retro gait, narrow BOS with eyes closed at counter    Consulted and Agree with Plan of Care Patient      Patient will benefit from skilled therapeutic intervention in order to improve the following deficits and impairments:  Abnormal gait, Decreased coordination, Decreased mobility, Decreased activity tolerance,  Decreased strength, Decreased balance  Visit Diagnosis: Unsteadiness on feet - Plan: PT plan of care cert/re-cert  Muscle weakness (generalized) - Plan: PT plan of care cert/re-cert  Other symptoms and signs involving the musculoskeletal system - Plan: PT plan of care cert/re-cert       G-Codes - 2016/09/26 1215    Functional Assessment Tool Used (Outpatient Only) Based on skilled PT assessment of strength, gait, balance, fall risk    Functional Limitation Mobility: Walking and moving around   Mobility: Walking and Moving Around Goal Status [phone]) At least 1 percent but less than 20 percent impaired, limited or restricted   Mobility: Walking and Moving Around Discharge Status [phone]) At least 1 percent but less than 20 percent impaired, limited or restricted      Problem List Patient Active Problem List   Diagnosis Date Noted  . Perforated diverticulitis 07/04/2012  . Osteopenia 07/04/2012  . Umbilical hernia 88/82/8003   PHYSICAL THERAPY DISCHARGE SUMMARY  Visits from Start of Care: 9  Current functional level related to goals / functional outcomes: See above    Remaining deficits: See above    Education / Equipment: See above  Plan: Patient agrees to discharge.  Patient goals were partially met. Patient is being discharged due to lack of progress.  ?????      Deniece Ree PT, DPT [phone]  [place], [place], [address] Phone: [phone]   Fax:  [phone]  Name: Emma Henderson MRN: [id number] Date of Birth: 04/06/1945

## 2016-09-19 NOTE — Patient Instructions (Signed)
   TANDEM STANCE BALANCE (AT KITCHEN COUNTER FOR BALANCE)  Stand and balnace in tandem stance. Hold this position as long as you can, then switch the position of your feet.  Repeat 3 times with your feet each way, twice a day.       TANDEM WALK (DO IN YOUR HALLWAY FOR SAFETY)  Stand with one foot directly in front of the other so that the toes of one foot touches the heel of the other as shown in the image.  Progress by taking steps with your heel touching your toes; Maintain your balance.   Repeat 3-4 full laps, twice a day.    RETRO GAIT (DO IN HALLWAY FOR SAFETY)  Walk backwards. As you take steps backward, leading with the toe touching the floor first and then rolling to a flat foot as shown.  Make sure you are taking as large of steps as possible.   Repeat 3 full laps, twice a day.    STANDING WITH NARROW BASE OF SUPPORT (AT [place])  Standing at your kitchen counter with your feet touching each other, hover your hands over the counter and close your eyes.  Count to 15 and open your eyes back up.  Repeat 3 times, twice a day.

## 2016-10-17 DIAGNOSIS — R69 Illness, unspecified: Secondary | ICD-10-CM | POA: Diagnosis not present

## 2016-11-20 DIAGNOSIS — M25511 Pain in right shoulder: Secondary | ICD-10-CM | POA: Diagnosis not present

## 2016-11-20 DIAGNOSIS — M5412 Radiculopathy, cervical region: Secondary | ICD-10-CM | POA: Diagnosis not present

## 2016-11-26 DIAGNOSIS — H5200 Hypermetropia, unspecified eye: Secondary | ICD-10-CM | POA: Diagnosis not present

## 2016-12-09 DIAGNOSIS — R69 Illness, unspecified: Secondary | ICD-10-CM | POA: Diagnosis not present

## 2016-12-26 DIAGNOSIS — R69 Illness, unspecified: Secondary | ICD-10-CM | POA: Diagnosis not present

## 2017-02-05 DIAGNOSIS — R69 Illness, unspecified: Secondary | ICD-10-CM | POA: Diagnosis not present

## 2017-05-05 ENCOUNTER — Other Ambulatory Visit (HOSPITAL_COMMUNITY): Payer: Self-pay | Admitting: Internal Medicine

## 2017-05-05 DIAGNOSIS — Z1231 Encounter for screening mammogram for malignant neoplasm of breast: Secondary | ICD-10-CM

## 2017-05-19 ENCOUNTER — Ambulatory Visit (INDEPENDENT_AMBULATORY_CARE_PROVIDER_SITE_OTHER): Payer: Medicare HMO | Admitting: Gynecology

## 2017-05-19 ENCOUNTER — Encounter: Payer: Self-pay | Admitting: Gynecology

## 2017-05-19 VITALS — BP 116/74 | Ht 63.0 in | Wt 151.0 lb

## 2017-05-19 DIAGNOSIS — M8580 Other specified disorders of bone density and structure, unspecified site: Secondary | ICD-10-CM

## 2017-05-19 DIAGNOSIS — N952 Postmenopausal atrophic vaginitis: Secondary | ICD-10-CM

## 2017-05-19 DIAGNOSIS — Z01411 Encounter for gynecological examination (general) (routine) with abnormal findings: Secondary | ICD-10-CM | POA: Diagnosis not present

## 2017-05-19 NOTE — Progress Notes (Signed)
    ELVIE PALOMO [birth year] [id number]        72 y.o.  [id number] for breast and pelvic exam.  Past medical history,surgical history, problem list, medications, allergies, family history and social history were all reviewed and documented as reviewed in the EPIC chart.  ROS:  Performed with pertinent positives and negatives included in the history, assessment and plan.   Additional significant findings : None   Exam: Caryn Bee assistant Vitals:   05/19/17 0928  BP: 116/74  Weight: 151 lb (68.5 kg)  Height: 5\' 3"  (1.6 m)   Body mass index is 26.75 kg/m.  General appearance:  Normal affect, orientation and appearance. Skin: Grossly normal HEENT: Without gross lesions.  No cervical or supraclavicular adenopathy. Thyroid normal.  Lungs:  Clear without wheezing, rales or rhonchi Cardiac: RR, without RMG Abdominal:  Soft, nontender, without masses, guarding, rebound, organomegaly or hernia Breasts:  Examined lying and sitting without masses, retractions, discharge or axillary adenopathy. Pelvic:  Ext, BUS, Vagina: With atrophic changes  Cervix: With atrophic changes  Uterus: Anteverted, normal size, shape and contour, midline and mobile nontender   Adnexa: Without masses or tenderness    Anus and perineum: Normal   Rectovaginal: Normal sphincter tone without palpated masses or tenderness.    Assessment/Plan:  72 y.o. [id number] female for breast and pelvic exam.   1. Postmenopausal/atrophic genital changes.  Using Vagifem 10 mcg twice weekly.  Doing well with this and wants to continue.  We have discussed the issues of absorption and systemic effects and risks.  I also reviewed options to try Imvexxy.  Patient interested in trying.  Prescription provided for 1 year supply twice weekly.  She will call if there is any issues and she needs the Vagifem instead of the Imvexxy. 2. Osteopenia.  DEXA 2017 T score -1.6.  FRAX 10% / 1.6%.  Patient is going to schedule her DEXA now in follow-up  with her mammogram. 3. Mammography due now and patient is in the process of arranging.  Breast exam normal today. 4. Pap smear/HPV 2014.  No Pap smear done today.  No history of abnormal Pap smears previously.  We reviewed current screening guidelines and she is comfortable with stop screening based on age. 5. Colonoscopy 2014.  Repeat at their recommended interval. 6. Health maintenance.  Patient does note some left upper leg discomfort with walking any distances.  I discussed differential to include musculoskeletal, neurologic and vascular such as intermittent claudication.  I recommended she follow-up with her primary physician for further evaluation and she agrees to arrange this.  No routine lab work done as patient does this elsewhere.  Follow-up 1 year, sooner as needed.   Anastasio Auerbach MD, 10:00 AM 05/19/2017

## 2017-05-19 NOTE — Patient Instructions (Signed)
Follow-up with your primary physician in reference to your leg discomfort.  Follow-up if any issues as far as the vaginal estrogen.  Follow-up in 1 year for annual exam.

## 2017-05-29 ENCOUNTER — Other Ambulatory Visit: Payer: Self-pay | Admitting: Gynecology

## 2017-05-29 MED ORDER — ESTRADIOL 10 MCG VA INST: 10.0000 ug | VAGINAL_INSERT | VAGINAL | 3 refills | Status: DC

## 2017-06-04 ENCOUNTER — Telehealth: Payer: Self-pay | Admitting: *Deleted

## 2017-06-04 DIAGNOSIS — M8580 Other specified disorders of bone density and structure, unspecified site: Secondary | ICD-10-CM

## 2017-06-04 NOTE — Telephone Encounter (Signed)
Patient called requesting order placed at [facility] for bone density, order placed. I told pt she can call and schedule.

## 2017-06-12 ENCOUNTER — Ambulatory Visit (HOSPITAL_COMMUNITY)
Admission: RE | Admit: 2017-06-12 | Discharge: 2017-06-12 | Disposition: A | Discharge status: Home / Self Care | Payer: Medicare HMO | Source: Ambulatory Visit | Attending: Gynecology | Admitting: Gynecology

## 2017-06-12 ENCOUNTER — Ambulatory Visit (HOSPITAL_COMMUNITY)
Admission: RE | Admit: 2017-06-12 | Discharge: 2017-06-12 | Disposition: A | Discharge status: Home / Self Care | Payer: Medicare HMO | Source: Ambulatory Visit | Attending: Internal Medicine | Admitting: Internal Medicine

## 2017-06-12 ENCOUNTER — Encounter (HOSPITAL_COMMUNITY): Payer: Self-pay

## 2017-06-12 DIAGNOSIS — Z1231 Encounter for screening mammogram for malignant neoplasm of breast: Secondary | ICD-10-CM | POA: Insufficient documentation

## 2017-06-12 DIAGNOSIS — Z780 Asymptomatic menopausal state: Secondary | ICD-10-CM | POA: Diagnosis not present

## 2017-06-12 DIAGNOSIS — M8580 Other specified disorders of bone density and structure, unspecified site: Secondary | ICD-10-CM

## 2017-06-12 DIAGNOSIS — M85851 Other specified disorders of bone density and structure, right thigh: Secondary | ICD-10-CM | POA: Diagnosis not present

## 2017-06-17 ENCOUNTER — Encounter: Payer: Self-pay | Admitting: Gynecology

## 2017-10-10 DIAGNOSIS — R69 Illness, unspecified: Secondary | ICD-10-CM | POA: Diagnosis not present

## 2017-10-22 DIAGNOSIS — L819 Disorder of pigmentation, unspecified: Secondary | ICD-10-CM | POA: Diagnosis not present

## 2017-10-22 DIAGNOSIS — D1801 Hemangioma of skin and subcutaneous tissue: Secondary | ICD-10-CM | POA: Diagnosis not present

## 2017-10-22 DIAGNOSIS — L720 Epidermal cyst: Secondary | ICD-10-CM | POA: Diagnosis not present

## 2017-10-22 DIAGNOSIS — D225 Melanocytic nevi of trunk: Secondary | ICD-10-CM | POA: Diagnosis not present

## 2017-10-22 DIAGNOSIS — L738 Other specified follicular disorders: Secondary | ICD-10-CM | POA: Diagnosis not present

## 2017-10-22 DIAGNOSIS — L821 Other seborrheic keratosis: Secondary | ICD-10-CM | POA: Diagnosis not present

## 2017-11-05 DIAGNOSIS — Z79899 Other long term (current) drug therapy: Secondary | ICD-10-CM | POA: Diagnosis not present

## 2017-11-05 DIAGNOSIS — M1990 Unspecified osteoarthritis, unspecified site: Secondary | ICD-10-CM | POA: Diagnosis not present

## 2018-01-09 DIAGNOSIS — Z0100 Encounter for examination of eyes and vision without abnormal findings: Secondary | ICD-10-CM | POA: Diagnosis not present

## 2018-01-09 DIAGNOSIS — H2512 Age-related nuclear cataract, left eye: Secondary | ICD-10-CM | POA: Diagnosis not present

## 2018-01-09 DIAGNOSIS — H04123 Dry eye syndrome of bilateral lacrimal glands: Secondary | ICD-10-CM | POA: Diagnosis not present

## 2018-01-09 DIAGNOSIS — H5200 Hypermetropia, unspecified eye: Secondary | ICD-10-CM | POA: Diagnosis not present

## 2018-01-09 DIAGNOSIS — H25811 Combined forms of age-related cataract, right eye: Secondary | ICD-10-CM | POA: Diagnosis not present

## 2018-01-27 DIAGNOSIS — E785 Hyperlipidemia, unspecified: Secondary | ICD-10-CM | POA: Diagnosis not present

## 2018-01-27 DIAGNOSIS — Z0001 Encounter for general adult medical examination with abnormal findings: Secondary | ICD-10-CM | POA: Diagnosis not present

## 2018-01-27 DIAGNOSIS — M5136 Other intervertebral disc degeneration, lumbar region: Secondary | ICD-10-CM | POA: Diagnosis not present

## 2018-01-27 DIAGNOSIS — R030 Elevated blood-pressure reading, without diagnosis of hypertension: Secondary | ICD-10-CM | POA: Diagnosis not present

## 2018-01-27 DIAGNOSIS — Z6827 Body mass index (BMI) 27.0-27.9, adult: Secondary | ICD-10-CM | POA: Diagnosis not present

## 2018-02-03 DIAGNOSIS — R69 Illness, unspecified: Secondary | ICD-10-CM | POA: Diagnosis not present

## 2018-02-18 DIAGNOSIS — R69 Illness, unspecified: Secondary | ICD-10-CM | POA: Diagnosis not present

## 2018-03-04 DIAGNOSIS — R69 Illness, unspecified: Secondary | ICD-10-CM | POA: Diagnosis not present

## 2018-03-18 DIAGNOSIS — R69 Illness, unspecified: Secondary | ICD-10-CM | POA: Diagnosis not present

## 2018-03-31 DIAGNOSIS — R69 Illness, unspecified: Secondary | ICD-10-CM | POA: Diagnosis not present

## 2018-05-05 DIAGNOSIS — R030 Elevated blood-pressure reading, without diagnosis of hypertension: Secondary | ICD-10-CM | POA: Diagnosis not present

## 2018-05-21 ENCOUNTER — Encounter: Payer: Medicare HMO | Admitting: Gynecology

## 2018-06-10 ENCOUNTER — Other Ambulatory Visit (HOSPITAL_COMMUNITY): Payer: Self-pay | Admitting: Internal Medicine

## 2018-06-10 DIAGNOSIS — Z1231 Encounter for screening mammogram for malignant neoplasm of breast: Secondary | ICD-10-CM

## 2018-07-21 ENCOUNTER — Encounter: Payer: Medicare HMO | Admitting: Gynecology

## 2018-09-16 ENCOUNTER — Ambulatory Visit (HOSPITAL_COMMUNITY): Payer: Medicare HMO

## 2018-10-08 DIAGNOSIS — R69 Illness, unspecified: Secondary | ICD-10-CM | POA: Diagnosis not present

## 2018-10-28 ENCOUNTER — Encounter: Payer: Self-pay | Admitting: Gynecology

## 2018-11-02 ENCOUNTER — Ambulatory Visit (HOSPITAL_COMMUNITY)
Admission: RE | Admit: 2018-11-02 | Discharge: 2018-11-02 | Disposition: A | Discharge status: Home / Self Care | Payer: Medicare HMO | Source: Ambulatory Visit | Attending: Internal Medicine | Admitting: Internal Medicine

## 2018-11-02 ENCOUNTER — Other Ambulatory Visit: Payer: Self-pay

## 2018-11-02 DIAGNOSIS — Z1231 Encounter for screening mammogram for malignant neoplasm of breast: Secondary | ICD-10-CM | POA: Diagnosis not present

## 2018-12-10 ENCOUNTER — Other Ambulatory Visit: Payer: Self-pay

## 2018-12-10 DIAGNOSIS — Z20822 Contact with and (suspected) exposure to covid-19: Secondary | ICD-10-CM

## 2018-12-13 LAB — NOVEL CORONAVIRUS, NAA: SARS-CoV-2, NAA: NOT DETECTED

## 2018-12-21 ENCOUNTER — Telehealth: Payer: Self-pay | Admitting: *Deleted

## 2018-12-21 NOTE — Telephone Encounter (Signed)
Pt given result of COVID test obtained 12/10/2018; she verbalized understanding; she asked about retesting; advised pt that recommendation is If you had a COVID-19 test with a negative result (COVID not detected) we recommend no further testing or retesting for 14 days from the date of the original test. There may be circumstance that require a retest prior to the end of the 14 day window. If you feel you need a COVID-19 retest please contact your primary care provider; she verbalized understanding, and will contact her PCP.

## 2019-01-06 DIAGNOSIS — L853 Xerosis cutis: Secondary | ICD-10-CM | POA: Diagnosis not present

## 2019-01-06 DIAGNOSIS — L84 Corns and callosities: Secondary | ICD-10-CM | POA: Diagnosis not present

## 2019-01-06 DIAGNOSIS — L218 Other seborrheic dermatitis: Secondary | ICD-10-CM | POA: Diagnosis not present

## 2019-01-06 DIAGNOSIS — L821 Other seborrheic keratosis: Secondary | ICD-10-CM | POA: Diagnosis not present

## 2019-01-06 DIAGNOSIS — D2261 Melanocytic nevi of right upper limb, including shoulder: Secondary | ICD-10-CM | POA: Diagnosis not present

## 2019-01-06 DIAGNOSIS — D1801 Hemangioma of skin and subcutaneous tissue: Secondary | ICD-10-CM | POA: Diagnosis not present

## 2019-02-08 DIAGNOSIS — M5136 Other intervertebral disc degeneration, lumbar region: Secondary | ICD-10-CM | POA: Diagnosis not present

## 2019-02-08 DIAGNOSIS — E785 Hyperlipidemia, unspecified: Secondary | ICD-10-CM | POA: Diagnosis not present

## 2019-02-08 DIAGNOSIS — R030 Elevated blood-pressure reading, without diagnosis of hypertension: Secondary | ICD-10-CM | POA: Diagnosis not present

## 2019-02-16 ENCOUNTER — Ambulatory Visit: Payer: Medicare HMO

## 2019-02-25 ENCOUNTER — Ambulatory Visit: Payer: Medicare HMO | Attending: Internal Medicine

## 2019-03-05 ENCOUNTER — Ambulatory Visit: Payer: Medicare HMO

## 2019-03-21 ENCOUNTER — Other Ambulatory Visit: Payer: Self-pay

## 2019-03-21 ENCOUNTER — Ambulatory Visit: Payer: Medicare HMO | Attending: Internal Medicine

## 2019-03-21 DIAGNOSIS — Z23 Encounter for immunization: Secondary | ICD-10-CM

## 2019-03-21 NOTE — Progress Notes (Signed)
   Covid-19 Vaccination Clinic  Name:  Emma Henderson    MRN: [id number] DOB: [birth year]  03/21/2019  Emma Henderson was observed post Covid-19 immunization for 15 minutes without incidence. She was provided with Vaccine Information Sheet and instruction to access the V-Safe system.   Emma Henderson was instructed to call 911 with any severe reactions post vaccine: Marland Kitchen Difficulty breathing  . Swelling of your face and throat  . A fast heartbeat  . A bad rash all over your body  . Dizziness and weakness    Immunizations Administered    Name Date Dose VIS Date Route   Moderna COVID-19 Vaccine 03/21/2019 11:02 AM 0.5 mL 12/22/2018 Intramuscular   Manufacturer: Moderna   Lot: [id number]   [id number]

## 2019-09-01 DIAGNOSIS — Z0100 Encounter for examination of eyes and vision without abnormal findings: Secondary | ICD-10-CM | POA: Diagnosis not present

## 2019-09-01 DIAGNOSIS — H5200 Hypermetropia, unspecified eye: Secondary | ICD-10-CM | POA: Diagnosis not present

## 2019-10-29 ENCOUNTER — Other Ambulatory Visit (HOSPITAL_COMMUNITY): Payer: Self-pay | Admitting: Internal Medicine

## 2019-10-29 DIAGNOSIS — Z1231 Encounter for screening mammogram for malignant neoplasm of breast: Secondary | ICD-10-CM

## 2019-11-10 ENCOUNTER — Ambulatory Visit (HOSPITAL_COMMUNITY)
Admission: RE | Admit: 2019-11-10 | Discharge: 2019-11-10 | Disposition: A | Discharge status: Home / Self Care | Payer: Medicare HMO | Source: Ambulatory Visit | Attending: Internal Medicine | Admitting: Internal Medicine

## 2019-11-10 ENCOUNTER — Other Ambulatory Visit: Payer: Self-pay

## 2019-11-10 DIAGNOSIS — Z1231 Encounter for screening mammogram for malignant neoplasm of breast: Secondary | ICD-10-CM | POA: Insufficient documentation

## 2020-01-18 ENCOUNTER — Other Ambulatory Visit (HOSPITAL_COMMUNITY): Payer: Self-pay | Admitting: Internal Medicine

## 2020-01-18 DIAGNOSIS — Z780 Asymptomatic menopausal state: Secondary | ICD-10-CM

## 2020-01-27 ENCOUNTER — Other Ambulatory Visit: Payer: Self-pay

## 2020-01-27 ENCOUNTER — Ambulatory Visit (HOSPITAL_COMMUNITY)
Admission: RE | Admit: 2020-01-27 | Discharge: 2020-01-27 | Disposition: A | Discharge status: Home / Self Care | Payer: Medicare HMO | Source: Ambulatory Visit | Attending: Internal Medicine | Admitting: Internal Medicine

## 2020-01-27 DIAGNOSIS — Z780 Asymptomatic menopausal state: Secondary | ICD-10-CM | POA: Diagnosis not present

## 2020-01-27 DIAGNOSIS — M8589 Other specified disorders of bone density and structure, multiple sites: Secondary | ICD-10-CM | POA: Diagnosis not present

## 2020-01-31 DIAGNOSIS — D1801 Hemangioma of skin and subcutaneous tissue: Secondary | ICD-10-CM | POA: Diagnosis not present

## 2020-01-31 DIAGNOSIS — L218 Other seborrheic dermatitis: Secondary | ICD-10-CM | POA: Diagnosis not present

## 2020-01-31 DIAGNOSIS — L821 Other seborrheic keratosis: Secondary | ICD-10-CM | POA: Diagnosis not present

## 2020-01-31 DIAGNOSIS — L570 Actinic keratosis: Secondary | ICD-10-CM | POA: Diagnosis not present

## 2020-01-31 DIAGNOSIS — D225 Melanocytic nevi of trunk: Secondary | ICD-10-CM | POA: Diagnosis not present

## 2020-02-10 DIAGNOSIS — E785 Hyperlipidemia, unspecified: Secondary | ICD-10-CM | POA: Diagnosis not present

## 2020-02-10 DIAGNOSIS — M859 Disorder of bone density and structure, unspecified: Secondary | ICD-10-CM | POA: Diagnosis not present

## 2020-02-10 DIAGNOSIS — Z79899 Other long term (current) drug therapy: Secondary | ICD-10-CM | POA: Diagnosis not present

## 2020-02-17 DIAGNOSIS — R7301 Impaired fasting glucose: Secondary | ICD-10-CM | POA: Diagnosis not present

## 2020-02-17 DIAGNOSIS — Z0001 Encounter for general adult medical examination with abnormal findings: Secondary | ICD-10-CM | POA: Diagnosis not present

## 2020-02-17 DIAGNOSIS — Z6825 Body mass index (BMI) 25.0-25.9, adult: Secondary | ICD-10-CM | POA: Diagnosis not present

## 2020-02-17 DIAGNOSIS — E785 Hyperlipidemia, unspecified: Secondary | ICD-10-CM | POA: Diagnosis not present

## 2020-02-17 DIAGNOSIS — M5136 Other intervertebral disc degeneration, lumbar region: Secondary | ICD-10-CM | POA: Diagnosis not present

## 2020-02-24 ENCOUNTER — Ambulatory Visit: Payer: Medicare HMO | Admitting: Neurology

## 2020-02-24 ENCOUNTER — Other Ambulatory Visit: Payer: Self-pay

## 2020-02-24 ENCOUNTER — Encounter: Payer: Self-pay | Admitting: Neurology

## 2020-02-24 VITALS — BP 165/75 | HR 78 | Ht 62.0 in | Wt 140.8 lb

## 2020-02-24 DIAGNOSIS — E538 Deficiency of other specified B group vitamins: Secondary | ICD-10-CM

## 2020-02-24 DIAGNOSIS — R269 Unspecified abnormalities of gait and mobility: Secondary | ICD-10-CM

## 2020-02-24 DIAGNOSIS — M4155 Other secondary scoliosis, thoracolumbar region: Secondary | ICD-10-CM | POA: Diagnosis not present

## 2020-02-24 NOTE — Progress Notes (Addendum)
Reason for visit: Gait disturbance  Referring physician: Dr. Doyne Keel is a 75 y.o. female  History of present illness:  Emma Henderson is a 75 year old right-handed white female with a history of a mild gait disorder that has been present for at least 7 or 8 years.  She has had cervical spine surgery done 20 years ago and lumbosacral spine surgery done 40 years ago.  The patient has had some residual burning discomfort down the left leg to the foot.  She reports no definite weakness of the lower extremities or with arms.  She does report some urinary frequency and incontinence.  She does have occasional falls.  She reports that she walks at least 3 to 4 miles each day, she has no balance issues while walking but when she is standing still she feels a sensation of imbalance.  She feels as if she is going forward slightly and feels that she has to curl her toes under to keep her balance.  She does not feel comfortable until she leans up against something.  She has been noted to have a significant drop of the right shoulder as compared to the left.  MRI of the brain done 7 years ago shows a mild to moderate level of small vessel ischemic changes.  The patient does not believe there has been severe or significant worsening of her balance issues over time.  She denies any headaches, vision changes, or difficulty with speech or swallowing.  She is sent to this office for further evaluation.  Past Medical History:  Diagnosis Date  . Arthritis    "hands" (11/04/2012)  . Cataract   . Diverticulosis 2006  . [id number])    "probably monthly" 11/04/2012)  . Osteopenia 05/2017   T score -1.8 FRAX 11.6% / 2.3%  . Perforated diverticulitis 07/04/2012  . Umbilical hernia [id number]  . Unsteady gait   . Urinary incontinence     Past Surgical History:  Procedure Laterality Date  . CERVICAL SPINE SURGERY  ~ 1997  . COLON SURGERY    . HERNIA REPAIR  11/04/2012  . LAPAROSCOPIC PARTIAL  COLECTOMY N/A 11/04/2012   Procedure: LAPAROSCOPIC SIGMOID COLECTOMY;  Surgeon: Harl Bowie, MD;  Location: [place];  Service: General;  Laterality: N/A;  . LAPAROSCOPIC SIGMOID COLECTOMY  11/04/2012   ABSCESS DIVERTICULAR     . LUMBAR SPINE SURGERY  ~ 6  . PROCTOPLASTY N/A 11/04/2012   Procedure: RIGID PROCTOSCOPY;  Surgeon: Harl Bowie, MD;  Location[facility];  Service: General;  Laterality: N/A;    Family History  Problem Relation Age of Onset  . Heart disease Mother        heart attacks died with second one  . Stroke Father        died of a stroke  . Heart disease Father        had two MI's  . Other Father        nonmalignant brain tumor  . Heart disease Brother     Social history:  reports that she quit smoking about 50 years ago. Her smoking use included cigarettes. She has a 20.00 pack-year smoking history. She has never used smokeless tobacco. She reports current alcohol use. She reports that she does not use drugs.  Medications:  Prior to Admission medications   Medication Sig Start Date End Date Taking? Authorizing Provider  acetaminophen (TYLENOL) 500 MG tablet Take 500 mg by mouth every 6 (six) hours as needed  for pain.   Yes [provider]  ibuprofen (ADVIL) 200 MG tablet Take 200 mg by mouth every 6 (six) hours as needed.   Yes [provider]  Cholecalciferol (VITAMIN D PO) Take by mouth.    [provider]  Estradiol (IMVEXXY STARTER PACK) 10 MCG INST Place 10 mcg vaginally 2 (two) times a week. 05/29/17   Fontaine, Belinda Block, MD     No Known Allergies  ROS:  Out of a complete 14 system review of symptoms, the patient complains only of the following symptoms, and all other reviewed systems are negative.  Walking difficulty Left leg discomfort  Blood pressure (!) 165/75, pulse 78, height 5\' 2"  (1.575 m), weight 140 lb 12.8 oz (63.9 kg), last menstrual period 01/21/1998.  Physical Exam  General: The patient is alert  and cooperative at the time of the examination.  Eyes: Pupils are equal, round, and reactive to light. Discs are flat bilaterally.  Neck: The neck is supple, no carotid bruits are noted.  Respiratory: The respiratory examination is clear.  Cardiovascular: The cardiovascular examination reveals a regular rate and rhythm, no obvious murmurs or rubs are noted.  Neuromuscular: With standing, the patient has elevation of the left shoulder as compared to the right with rightward rotation of the upper body and slight flexion forward.  Skin: Extremities are without significant edema.  Neurologic Exam  Mental status: The patient is alert and oriented x 3 at the time of the examination. The patient has apparent normal recent and remote memory, with an apparently normal attention span and concentration ability.  Cranial nerves: Facial symmetry is present. There is good sensation of the face to pinprick and soft touch bilaterally. The strength of the facial muscles and the muscles to head turning and shoulder shrug are normal bilaterally. Speech is well enunciated, no aphasia or dysarthria is noted. Extraocular movements are full. Visual fields are full. The tongue is midline, and the patient has symmetric elevation of the soft palate. No obvious hearing deficits are noted.  Motor: The motor testing reveals 5 over 5 strength of all 4 extremities. Good symmetric motor tone is noted throughout.  Sensory: Sensory testing is intact to pinprick, soft touch, vibration sensation, and position sense on all 4 extremities. No evidence of extinction is noted.  Coordination: Cerebellar testing reveals good finger-nose-finger and heel-to-shin bilaterally.  A mild intention tremor seen with finger-nose-finger bilaterally.  Gait and station: Gait is normal, the patient can walk independently. Tandem gait is slightly unsteady. Romberg is negative. No drift is seen.  Reflexes: Deep tendon reflexes are symmetric, but  are somewhat brisk in the arms and legs with exception that the left ankle jerk reflex is absent. Toes are downgoing bilaterally.   MRI brain 11/04/11:  IMPRESSION:  Chronic microvascular ischemia in the white matter. No acute  Abnormality.  * MRI scan images were reviewed online. I agree with the written report.    Assessment/Plan:  1.  Mild gait disorder  2.  Probable scoliosis  The patient has significant postural changes associated with what appears to be scoliosis.  The patient will undergo x-rays of the lumbar and thoracic spine for this reason.  The patient will undergo MRI of the brain and compared to the prior study looking for progression of the small vessel changes.  She will undergo MRI of the cervical spine given the hyperreflexia of the lower extremities and a prior history of cervical spine surgery.  She will have blood work done  today.  She claims that she has had physical therapy in the past that offered no significant benefit with her walking.  She will follow up here in about 4 months.   Jill Alexanders MD 02/24/2020 4:23 PM  [facility] [address] [place], [place] [address]  Phone [phone] Fax [phone]

## 2020-02-25 ENCOUNTER — Other Ambulatory Visit: Payer: Self-pay

## 2020-02-25 ENCOUNTER — Ambulatory Visit (HOSPITAL_COMMUNITY)
Admission: RE | Admit: 2020-02-25 | Discharge: 2020-02-25 | Disposition: A | Discharge status: Home / Self Care | Payer: Medicare HMO | Source: Ambulatory Visit | Attending: Neurology | Admitting: Neurology

## 2020-02-25 DIAGNOSIS — M79605 Pain in left leg: Secondary | ICD-10-CM | POA: Insufficient documentation

## 2020-02-25 DIAGNOSIS — M5136 Other intervertebral disc degeneration, lumbar region: Secondary | ICD-10-CM | POA: Insufficient documentation

## 2020-02-25 DIAGNOSIS — M4155 Other secondary scoliosis, thoracolumbar region: Secondary | ICD-10-CM

## 2020-02-26 ENCOUNTER — Telehealth: Payer: Self-pay | Admitting: Neurology

## 2020-02-26 LAB — SEDIMENTATION RATE: Sed Rate: 6 mm/hr (ref 0–40)

## 2020-02-26 LAB — VITAMIN B12: Vitamin B-12: 549 pg/mL (ref 232–1245)

## 2020-02-26 LAB — RPR: RPR Ser Ql: NONREACTIVE

## 2020-02-26 LAB — COPPER, SERUM: Copper: 101 ug/dL (ref 80–158)

## 2020-02-26 NOTE — Telephone Encounter (Signed)
  I called the patient.  X-ray of the low back shows severe degenerative changes, this may be what is causing some of the rotational movement and tilting to the right for the patient.  No true scoliosis is seen.  XR lumbar 02/25/20:  IMPRESSION: Severe multilevel degenerative disc disease. No acute abnormality seen in the lumbar spine.   XR thoracic 02/25/20:  IMPRESSION: Negative.

## 2020-02-28 ENCOUNTER — Telehealth: Payer: Self-pay | Admitting: Neurology

## 2020-02-28 NOTE — Telephone Encounter (Signed)
aetna medicare order sent to GI. They will obtain the auth and reach out to the patient to schedule.  °

## 2020-02-29 NOTE — Telephone Encounter (Signed)
[employer] medicare auth: [id number] & [id number]. Patient is scheduled at [facility] for Tuesday 02/23/20 to arrive at 11:30 AM. Patient is aware of time and day. I also gave her their number of [phone] incase she needed to r/s.

## 2020-02-29 NOTE — Telephone Encounter (Signed)
BCBS Josem Kaufmann: [id number] (exp. 02/29/20 to 08/26/20) [facility] will reach out to the patient to scheduled and obtain the aetna medicare auth.

## 2020-03-14 ENCOUNTER — Other Ambulatory Visit (HOSPITAL_COMMUNITY): Payer: BC Managed Care – PPO

## 2020-03-14 ENCOUNTER — Ambulatory Visit (HOSPITAL_COMMUNITY): Payer: BC Managed Care – PPO

## 2020-03-15 ENCOUNTER — Ambulatory Visit (HOSPITAL_COMMUNITY): Payer: BC Managed Care – PPO

## 2020-03-16 ENCOUNTER — Ambulatory Visit (HOSPITAL_COMMUNITY): Payer: BC Managed Care – PPO

## 2020-03-23 ENCOUNTER — Other Ambulatory Visit: Payer: Self-pay

## 2020-03-23 ENCOUNTER — Ambulatory Visit (HOSPITAL_COMMUNITY)
Admission: RE | Admit: 2020-03-23 | Discharge: 2020-03-23 | Disposition: A | Discharge status: Home / Self Care | Payer: Medicare HMO | Source: Ambulatory Visit | Attending: Neurology | Admitting: Neurology

## 2020-03-23 ENCOUNTER — Telehealth: Payer: Self-pay | Admitting: Neurology

## 2020-03-23 DIAGNOSIS — R269 Unspecified abnormalities of gait and mobility: Secondary | ICD-10-CM

## 2020-03-23 DIAGNOSIS — M542 Cervicalgia: Secondary | ICD-10-CM | POA: Diagnosis not present

## 2020-03-23 DIAGNOSIS — H748X3 Other specified disorders of middle ear and mastoid, bilateral: Secondary | ICD-10-CM | POA: Diagnosis not present

## 2020-03-23 DIAGNOSIS — M4155 Other secondary scoliosis, thoracolumbar region: Secondary | ICD-10-CM

## 2020-03-23 DIAGNOSIS — M4802 Spinal stenosis, cervical region: Secondary | ICD-10-CM | POA: Diagnosis not present

## 2020-03-23 DIAGNOSIS — M47812 Spondylosis without myelopathy or radiculopathy, cervical region: Secondary | ICD-10-CM | POA: Diagnosis not present

## 2020-03-23 DIAGNOSIS — M2548 Effusion, other site: Secondary | ICD-10-CM | POA: Diagnosis not present

## 2020-03-23 NOTE — Telephone Encounter (Signed)
I called the patient.  The MRI of the brain does show at least a moderate level small vessel disease which may be an additive factor with her balance.  MRI of the cervical spine shows some spinal stenosis at the C5-6 level, just above her surgical site.  The formal report indicates mild spinal stenosis at this level, no clear evidence of spinal cord compression.  I think that the main issue with her balance is the rotational component to the spine and hips that are noted, pulling the patient's body towards the right with rotation and slightly forward, when she stops walking, she has the sensation that she is going to fall over.  She indicates that physical therapy previously did not help her, if she decides that she would like to try another session of physical therapy I will try to get this set up.   MRI brain 03/23/20:  IMPRESSION: 1. No acute intracranial abnormality. 2. Scattered and confluent foci of T2 hyperintensity within the white matter of the cerebral hemispheres, nonspecific but may represent moderate microvascular ischemic changes. 3. Mild bilateral mastoid effusion.   MRI cervical 03/23/20:  IMPRESSION: 1. Multilevel degenerative changes of the cervical spine as above, most prominent at C5-6 where there is mild spinal canal stenosis with moderate right and severe left neural foraminal narrowing. 2. Moderate neural foraminal narrowing on the right at C3-4 and C4-5.

## 2020-03-27 NOTE — Addendum Note (Signed)
Addended by: Kathrynn Ducking on: 03/27/2020 04:28 PM   Modules accepted: Orders

## 2020-03-27 NOTE — Telephone Encounter (Signed)
Pt. states that she has questions regarding vm left by Dr. She states it was also hard to understand the message. Please advise.

## 2020-03-27 NOTE — Telephone Encounter (Signed)
I called the patient.  I went over the MRI results.  I do not think that the small vessel disease is the primary cause of her walking problem.  I will get her into physical therapy, need to work on her posture and doing stretching and exercises to help correct this.  She will monitor her blood pressures, if they remain over 179 systolic consistently, then she should contact her primary care physician to get blood pressure control.  She will go on a low-dose aspirin daily.  When compared to prior study done in 2013 I believe that there has been some progression in the small vessel disease.

## 2020-03-27 NOTE — Telephone Encounter (Signed)
Called patient back, went over Dr. Tobey Grim findings regarding MRI's.  Patient had a couple questions, discussed the importance of a health diet, exercise and keeping brain busy with puzzles and memory games.    Patient is requesting balance therapy to be done in [place] if it is possible.  Message sent to Dr. Jannifer Franklin regarding information given and request.  Patient denied further questions, verbalized understanding and expressed appreciation for the phone call.

## 2020-04-06 ENCOUNTER — Other Ambulatory Visit: Payer: Self-pay

## 2020-04-06 ENCOUNTER — Encounter (HOSPITAL_COMMUNITY): Payer: Self-pay | Admitting: Physical Therapy

## 2020-04-06 ENCOUNTER — Ambulatory Visit (HOSPITAL_COMMUNITY): Payer: Medicare HMO | Attending: Neurology | Admitting: Physical Therapy

## 2020-04-06 DIAGNOSIS — R293 Abnormal posture: Secondary | ICD-10-CM | POA: Insufficient documentation

## 2020-04-06 DIAGNOSIS — M6281 Muscle weakness (generalized): Secondary | ICD-10-CM | POA: Insufficient documentation

## 2020-04-06 NOTE — Therapy (Signed)
[facility], [place], [address] Phone: [phone]   Fax:  [phone]  Physical Therapy Evaluation  Patient Details  Name: Emma Henderson MRN: [id number] Date of Birth: Sep 14, 1945 Referring Provider (PT): Margette Fast   Encounter Date: 04/06/2020   PT End of Session - 04/06/20 1041    Visit Number 1    Number of Visits 12    Date for PT Re-Evaluation 05/18/20    Authorization Type BCBS    PT Start Time [phone]    PT Stop Time 0920    PT Time Calculation (min) 42 min    Activity Tolerance Patient tolerated treatment well    Behavior During Therapy [facility] for tasks assessed/performed           Past Medical History:  Diagnosis Date  . Arthritis    "hands" (11/04/2012)  . Cataract   . Diverticulosis 2006  . XHBZJIRC(789.3)    "probably monthly" 11/04/2012)  . Osteopenia 05/2017   T score -1.8 FRAX 11.6% / 2.3%  . Perforated diverticulitis 07/04/2012  . Umbilical hernia 08/30/1749  . Unsteady gait   . Urinary incontinence     Past Surgical History:  Procedure Laterality Date  . CERVICAL SPINE SURGERY  ~ 1997  . COLON SURGERY    . HERNIA REPAIR  11/04/2012  . LAPAROSCOPIC PARTIAL COLECTOMY N/A 11/04/2012   Procedure: LAPAROSCOPIC SIGMOID COLECTOMY;  Surgeon: Harl Bowie, MD;  Location: [place];  Service: General;  Laterality: N/A;  . LAPAROSCOPIC SIGMOID COLECTOMY  11/04/2012   ABSCESS DIVERTICULAR     . LUMBAR SPINE SURGERY  ~ 37  . PROCTOPLASTY N/A 11/04/2012   Procedure: RIGID PROCTOSCOPY;  Surgeon: Harl Bowie, MD;  Location: [place];  Service: General;  Laterality: N/A;    There were no vitals filed for this visit.    Subjective Assessment - 04/06/20 0833    Subjective Emma Henderson states that she has been having difficulty with her gt for years.  She has been to therapy before and she does not feel that it helped.  She states that she does well walking but she has difficulty standing.  She  feels very unsteady and leans forward and twists.    She has been referred to therapy to assist with this.    Pertinent History OA; cervical and lumbar surgery    How long can you sit comfortably? no problem    How long can you stand comfortably? no time at all sits down to brushes her teeth.  Leans forward to lean against the counter    How long can you walk comfortably? Pt able to walk for 10,000 to 12,000 steps    Patient Stated Goals to be able to stand longer    Currently in Pain? No/denies   has back pain; MRI stenosis             [facility] PT Assessment - 04/06/20 0001      Assessment   Medical Diagnosis Gait abnormality    Referring Provider (PT) Margette Fast    Onset Date/Surgical Date --   chronic   Prior Therapy yes      Precautions   Precautions None      Restrictions   Weight Bearing Restrictions No      Balance Screen   Has the patient fallen in the past 6 months No    Has the patient had a decrease in activity level because of a fear of  falling?  No    Is the patient reluctant to leave their home because of a fear of falling?  No      Home Environment   Living Environment Private residence    Type of [place] Access Stairs to enter      Prior Function   Level of Independence Independent      Cognition   Overall Cognitive Status Within Functional Limits for tasks assessed      Functional Tests   Functional tests Single leg stance;Sit to Stand      Single Leg Stance   Comments Rt 2"; Lt 1      Sit to Stand   Comments 13 in 30 seconds      Posture/Postural Control   Posture/Postural Control Postural limitations    Postural Limitations Decreased lumbar lordosis;Decreased thoracic kyphosis;Posterior pelvic tilt;Left pelvic obliquity;Flexed trunk;Weight shift left      ROM / Strength   AROM / PROM / Strength Strength;AROM      AROM   AROM Assessment Site Lumbar    Lumbar Flexion fingers 13" from floor    Lumbar Extension 25   reps no  change   Lumbar - Right Side Bend 25    Lumbar - Left Side Bend 8      Strength   Strength Assessment Site Hip;Knee;Ankle    Right/Left Hip Right;Left    Right Hip Flexion 5/5    Right Hip Extension 4/5    Right Hip ABduction 5/5    Left Hip Flexion 5/5    Left Hip Extension 4/5    Left Hip ABduction 5/5    Right/Left Knee Right;Left    Right Knee Flexion 5/5    Right Knee Extension 5/5    Left Knee Flexion 5/5    Left Knee Extension 4/5    Right/Left Ankle Left;Right    Right Ankle Dorsiflexion 3-/5    Right Ankle Plantar Flexion 5/5    Left Ankle Dorsiflexion 3-/5    Left Ankle Plantar Flexion 5/5                      Objective measurements completed on examination: See above findings.       [facility] Adult PT Treatment/Exercise - 04/06/20 0001      Exercises   Exercises Lumbar      Lumbar Exercises: Stretches   Single Knee to Chest Stretch Left;Right;3 reps;30 seconds    Prone on Elbows Stretch 30 seconds;3 reps      Lumbar Exercises: Standing   Other Standing Lumbar Exercises shift RT hip to the side x 5                  PT Education - 04/06/20 1040    Education Details HEP    Person(s) Educated Patient    Methods Explanation;Demonstration;Tactile cues;Verbal cues;Handout    Comprehension Returned demonstration;Verbalized understanding            PT Short Term Goals - 04/06/20 1056      PT SHORT TERM GOAL #1   Title Pt to be I in HEp to improve posture    Time 3    Period Weeks    Status New    Target Date 04/20/20      PT SHORT TERM GOAL #2   Title Pt to be able to stand for 2 minutes    Time 2    Period Weeks    Status New  PT SHORT TERM GOAL #3   Title Pt to be able to single leg stance for 10 seconds to reduce risk of falling    Time 3    Period Weeks    Status New             PT Long Term Goals - 04/06/20 1058      PT LONG TERM GOAL #1   Title PT to be I in advance HEP in order to be able to stand in an  erect position.    Period Weeks    Status New      PT LONG TERM GOAL #2   Title PT to be able to stand for 10 minutes in order to complete self hygeine at sink, shower,    Time 6    Period Weeks    Status New      PT LONG TERM GOAL #3   Title PT to be able to single leg stance for 15 seconds in order to decrease risk of falling    Time 6    Period Weeks    Status New      PT LONG TERM GOAL #4   Title PT hip extension and ankle dorsiflexion strength to be 4+/5 to allow ease of walking up inclines.    Time 6    Period Weeks    Status New                  Plan - 04/06/20 1042    Clinical Impression Statement Emma Henderson is a 75 yo female who is concerned and referred to skilled PT due to her inability to stand.  She state that she startes to lose her balance and therefore bends forward and then her  legs begin to shake if she attempts to stand for any length of time.  Evaluation shows significant postural changes, decreased balance and decreased strength.  She will benefit from skilled PT to improve her posture and standing ability.  At this point the pt almost immediately looks for a place to sit down if she is unable walk.    Personal Factors and Comorbidities Comorbidity 2    Comorbidities cervical and lumbar surgery; cervical stenosis    Examination-Activity Limitations Bathing;Dressing;Hygiene/Grooming;Lift;Reach Overhead;Stand    Examination-Participation Restrictions Cleaning;Meal Prep;Laundry;Shop    Stability/Clinical Decision Making Evolving/Moderate complexity    Clinical Decision Making Moderate    Rehab Potential Good    PT Frequency 2x / week    PT Duration 6 weeks    PT Treatment/Interventions ADLs/Self Care Home Management;Functional mobility training;Therapeutic activities;Therapeutic exercise;Balance training;Neuromuscular re-education;Patient/family education;Manual techniques    PT Next Visit Plan begin decompressive exercises, mad cat/ old horse.  Begin  standing with mirrior and verbal cuing to correct postural changes pt begins to make when standing, manual to back to decrease tightness and improve mobility    PT Home Exercise Plan POE, knee to chest and standng RT hip shift.           Patient will benefit from skilled therapeutic intervention in order to improve the following deficits and impairments:  Abnormal gait,Decreased activity tolerance,Decreased balance,Decreased range of motion,Decreased strength,Hypomobility,Increased fascial restricitons,Postural dysfunction,Pain  Visit Diagnosis: Abnormal posture  Muscle weakness (generalized)     Problem List Patient Active Problem List   Diagnosis Date Noted  . Perforated diverticulitis 07/04/2012  . Osteopenia 07/04/2012  . Umbilical hernia 40/97/3532    Rayetta Humphrey, PT CLT [phone] 04/06/2020, 11:08 AM  [facility]  [place], [place], [address] Phone: [phone]   Fax:  [phone]  Name: Emma Henderson MRN: [id number] Date of Birth: 1945/09/18

## 2020-04-11 ENCOUNTER — Ambulatory Visit (HOSPITAL_COMMUNITY): Payer: Medicare HMO

## 2020-04-11 ENCOUNTER — Encounter (HOSPITAL_COMMUNITY): Payer: Self-pay

## 2020-04-11 ENCOUNTER — Other Ambulatory Visit: Payer: Self-pay

## 2020-04-11 DIAGNOSIS — M6281 Muscle weakness (generalized): Secondary | ICD-10-CM | POA: Diagnosis not present

## 2020-04-11 DIAGNOSIS — R293 Abnormal posture: Secondary | ICD-10-CM

## 2020-04-11 NOTE — Therapy (Signed)
[place], [place], [address] Phone: [phone]   Fax:  [phone]  Physical Therapy Treatment  Patient Details  Name: Emma Henderson MRN: [id number] Date of Birth: [birth year] Referring Provider (PT): Margette Fast   Encounter Date: 04/11/2020   PT End of Session - 04/11/20 1658    Visit Number 2    Number of Visits 12    Date for PT Re-Evaluation 05/18/20    Authorization Type BCBS    PT Start Time 4132    PT Stop Time 1740    PT Time Calculation (min) 42 min    Activity Tolerance Patient tolerated treatment well    Behavior During Therapy [facility] for tasks assessed/performed           Past Medical History:  Diagnosis Date  . Arthritis    "hands" (11/04/2012)  . Cataract   . Diverticulosis 2006  . GMWNUUVO(536.6)    "probably monthly" 11/04/2012)  . Osteopenia 05/2017   T score -1.8 FRAX 11.6% / 2.3%  . Perforated diverticulitis 07/04/2012  . Umbilical hernia 4/40/3474  . Unsteady gait   . Urinary incontinence     Past Surgical History:  Procedure Laterality Date  . CERVICAL SPINE SURGERY  ~ 1997  . COLON SURGERY    . HERNIA REPAIR  11/04/2012  . LAPAROSCOPIC PARTIAL COLECTOMY N/A 11/04/2012   Procedure: LAPAROSCOPIC SIGMOID COLECTOMY;  Surgeon: Harl Bowie, MD;  Location: [facility];  Service: General;  Laterality: N/A;  . LAPAROSCOPIC SIGMOID COLECTOMY  11/04/2012   ABSCESS DIVERTICULAR     . LUMBAR SPINE SURGERY  ~ 17  . PROCTOPLASTY N/A 11/04/2012   Procedure: RIGID PROCTOSCOPY;  Surgeon: Harl Bowie, MD;  Location: [facility];  Service: General;  Laterality: N/A;    There were no vitals filed for this visit.   Subjective Assessment - 04/11/20 1658    Subjective No reports of pain.  Reports she has been committed towards the current HEP daily.    Pertinent History OA; cervical and lumbar surgery    Patient Stated Goals to be able to stand longer    Currently in Pain? No/denies                              [facility] Adult PT Treatment/Exercise - 04/11/20 0001      Posture/Postural Control   Posture/Postural Control Postural limitations    Postural Limitations Decreased lumbar lordosis;Decreased thoracic kyphosis;Posterior pelvic tilt;Left pelvic obliquity;Flexed trunk;Weight shift left      Lumbar Exercises: Stretches   Single Knee to Chest Stretch Left;Right;3 reps;30 seconds    Prone on Elbows Stretch 2 reps;60 seconds    Prone on Elbows Stretch Limitations modified with pillow under hips      Lumbar Exercises: Standing   Other Standing Lumbar Exercises shift RT hip to the side x 5      Lumbar Exercises: Seated   Other Seated Lumbar Exercises Sitting posture with lumbar support      Lumbar Exercises: Supine   Other Supine Lumbar Exercises Decompression 2-5 holding 3-5"      Lumbar Exercises: Quadruped   Madcat/Old Horse 5 reps    Madcat/Old Horse Limitations 10" holds    Other Quadruped Lumbar Exercises Child's pose 1x30"                  PT Education - 04/11/20 1703    Education Details Reviewed  goals, educated importance of HEP compliance.  Pt reports some pain wiht POE, modified form with reports of relief    Person(s) Educated Patient    Methods Explanation;Demonstration    Comprehension Verbalized understanding;Returned demonstration            PT Short Term Goals - 04/06/20 1056      PT SHORT TERM GOAL #1   Title Pt to be I in HEp to improve posture    Time 3    Period Weeks    Status New    Target Date 04/20/20      PT SHORT TERM GOAL #2   Title Pt to be able to stand for 2 minutes    Time 2    Period Weeks    Status New      PT SHORT TERM GOAL #3   Title Pt to be able to single leg stance for 10 seconds to reduce risk of falling    Time 3    Period Weeks    Status New             PT Long Term Goals - 04/06/20 1058      PT LONG TERM GOAL #1   Title PT to be I in advance HEP in order to be able  to stand in an erect position.    Period Weeks    Status New      PT LONG TERM GOAL #2   Title PT to be able to stand for 10 minutes in order to complete self hygeine at sink, shower,    Time 6    Period Weeks    Status New      PT LONG TERM GOAL #3   Title PT to be able to single leg stance for 15 seconds in order to decrease risk of falling    Time 6    Period Weeks    Status New      PT LONG TERM GOAL #4   Title PT hip extension and ankle dorsiflexion strength to be 4+/5 to allow ease of walking up inclines.    Time 6    Period Weeks    Status New                 Plan - 04/11/20 1712    Clinical Impression Statement Reviewed goals and educated importance of HEP compliance for maximal benefits.  Pt reports she has been compliant with HEP, did ask for review with weight shifting and reports pain wiht POE.  Modified POE wiht additional pillow under hips with reports of relief following.  Session focus on spinal mobility and postural strengthening.  Pt able to complete all exercises with good form following initial cueing for form.  Mirror feedback helpful while working on equal weight shifting.  Discussed importance of posture for spinal alignment and pain control, shown lumbar support roll to assist wiht sitting for long periods of time.  Encouraged pt to adjust mirrors in car before leaving parking lot for improved postural habits.    Personal Factors and Comorbidities Comorbidity 2    Comorbidities cervical and lumbar surgery; cervical stenosis    Examination-Activity Limitations Bathing;Dressing;Hygiene/Grooming;Lift;Reach Overhead;Stand    Examination-Participation Restrictions Cleaning;Meal Prep;Laundry;Shop    Stability/Clinical Decision Making Evolving/Moderate complexity    Clinical Decision Making Moderate    Rehab Potential Good    PT Frequency 2x / week    PT Duration 6 weeks    PT Treatment/Interventions ADLs/Self Care Home Management;Functional  mobility  training;Therapeutic activities;Therapeutic exercise;Balance training;Neuromuscular re-education;Patient/family education;Manual techniques    PT Next Visit Plan Begin theraband decompression exercises and LTR.  Continue standing with mirrior and verbal cuing to correct postural changes pt begins to make when standing, manual to back to decrease tightness and improve mobility    PT Home Exercise Plan POE, knee to chest and standng RT hip shift.; 3/22: Decompression; cat/camel           Patient will benefit from skilled therapeutic intervention in order to improve the following deficits and impairments:  Abnormal gait,Decreased activity tolerance,Decreased balance,Decreased range of motion,Decreased strength,Hypomobility,Increased fascial restricitons,Postural dysfunction,Pain  Visit Diagnosis: Abnormal posture  Muscle weakness (generalized)     Problem List Patient Active Problem List   Diagnosis Date Noted  . Perforated diverticulitis 07/04/2012  . Osteopenia 07/04/2012  . Umbilical hernia 84/16/6063   Ihor Austin, LPTA/CLT; CBIS [phone]  Aldona Lento 04/11/2020, 5:49 PM  [facility] [address] [place], [place], [address] Phone: [phone]   Fax:  [phone]  Name: Emma Henderson MRN: [id number] Date of Birth: 1945/10/10

## 2020-04-13 ENCOUNTER — Encounter (HOSPITAL_COMMUNITY): Payer: BC Managed Care – PPO | Admitting: Physical Therapy

## 2020-04-18 ENCOUNTER — Other Ambulatory Visit: Payer: Self-pay

## 2020-04-18 ENCOUNTER — Ambulatory Visit (HOSPITAL_COMMUNITY): Payer: Medicare HMO | Admitting: Physical Therapy

## 2020-04-18 DIAGNOSIS — R293 Abnormal posture: Secondary | ICD-10-CM | POA: Diagnosis not present

## 2020-04-18 DIAGNOSIS — M6281 Muscle weakness (generalized): Secondary | ICD-10-CM | POA: Diagnosis not present

## 2020-04-18 NOTE — Therapy (Signed)
[place], [place], [address] Phone: [phone]   Fax:  [phone]  Physical Therapy Treatment  Patient Details  Name: Emma Henderson MRN: [id number] Date of Birth: 1945-09-01 Referring Provider (PT): Margette Fast   Encounter Date: 04/18/2020   PT End of Session - 04/18/20 0935    Visit Number 3    Number of Visits 12    Date for PT Re-Evaluation 05/18/20    Authorization Type BCBS    PT Start Time 0835    PT Stop Time 0917    PT Time Calculation (min) 42 min    Activity Tolerance Patient tolerated treatment well    Behavior During Therapy [facility] for tasks assessed/performed           Past Medical History:  Diagnosis Date  . Arthritis    "hands" (11/04/2012)  . Cataract   . Diverticulosis 2006  . WFUXNATF(573.2)    "probably monthly" 11/04/2012)  . Osteopenia 05/2017   T score -1.8 FRAX 11.6% / 2.3%  . Perforated diverticulitis 07/04/2012  . Umbilical hernia 02/22/5425  . Unsteady gait   . Urinary incontinence     Past Surgical History:  Procedure Laterality Date  . CERVICAL SPINE SURGERY  ~ 1997  . COLON SURGERY    . HERNIA REPAIR  11/04/2012  . LAPAROSCOPIC PARTIAL COLECTOMY N/A 11/04/2012   Procedure: LAPAROSCOPIC SIGMOID COLECTOMY;  Surgeon: Harl Bowie, MD;  Location: [place];  Service: General;  Laterality: N/A;  . LAPAROSCOPIC SIGMOID COLECTOMY  11/04/2012   ABSCESS DIVERTICULAR     . LUMBAR SPINE SURGERY  ~ 27  . PROCTOPLASTY N/A 11/04/2012   Procedure: RIGID PROCTOSCOPY;  Surgeon: Harl Bowie, MD;  Location: [place];  Service: General;  Laterality: N/A;    There were no vitals filed for this visit.   Subjective Assessment - 04/18/20 0834    Subjective pt states she can tell she's really shaky today.  States she's doing her exercises and trying to build her core.    Currently in Pain? No/denies                             [facility] Adult PT  Treatment/Exercise - 04/18/20 0001      Lumbar Exercises: Stretches   Single Knee to Chest Stretch Left;Right;3 reps;30 seconds    Prone on Elbows Stretch 2 reps;60 seconds    Prone on Elbows Stretch Limitations modified with pillow under hips      Lumbar Exercises: Seated   Other Seated Lumbar Exercises scapular retractions in good posture 10X3"      Lumbar Exercises: Supine   Bridge 15 reps    Straight Leg Raise 10 reps    Straight Leg Raises Limitations slow controlled    Other Supine Lumbar Exercises Decompression 2-5 holding 3-5"    Other Supine Lumbar Exercises decompression with theraband 5X each                    PT Short Term Goals - 04/06/20 1056      PT SHORT TERM GOAL #1   Title Pt to be I in HEp to improve posture    Time 3    Period Weeks    Status New    Target Date 04/20/20      PT SHORT TERM GOAL #2   Title Pt to be able to stand for 2 minutes  Time 2    Period Weeks    Status New      PT SHORT TERM GOAL #3   Title Pt to be able to single leg stance for 10 seconds to reduce risk of falling    Time 3    Period Weeks    Status New             PT Long Term Goals - 04/06/20 1058      PT LONG TERM GOAL #1   Title PT to be I in advance HEP in order to be able to stand in an erect position.    Period Weeks    Status New      PT LONG TERM GOAL #2   Title PT to be able to stand for 10 minutes in order to complete self hygeine at sink, shower,    Time 6    Period Weeks    Status New      PT LONG TERM GOAL #3   Title PT to be able to single leg stance for 15 seconds in order to decrease risk of falling    Time 6    Period Weeks    Status New      PT LONG TERM GOAL #4   Title PT hip extension and ankle dorsiflexion strength to be 4+/5 to allow ease of walking up inclines.    Time 6    Period Weeks    Status New                 Plan - 04/18/20 0932    Clinical Impression Statement Continued with decompression and core  strengthening focus.  Added decompression theraband exercises with good form and little cues needed.  Given handout and RTB to add to HEP this session.  Core stabilization in supine completed with addition of bridging and SLR.  Quadruped alternating UE added with noted weakness in upper body, shakiness.  Pt with questions regarding HEP but no further issues.    Personal Factors and Comorbidities Comorbidity 2    Comorbidities cervical and lumbar surgery; cervical stenosis    Examination-Activity Limitations Bathing;Dressing;Hygiene/Grooming;Lift;Reach Overhead;Stand    Examination-Participation Restrictions Cleaning;Meal Prep;Laundry;Shop    Stability/Clinical Decision Making Evolving/Moderate complexity    Rehab Potential Good    PT Frequency 2x / week    PT Duration 6 weeks    PT Treatment/Interventions ADLs/Self Care Home Management;Functional mobility training;Therapeutic activities;Therapeutic exercise;Balance training;Neuromuscular re-education;Patient/family education;Manual techniques    PT Next Visit Plan Continue standing with feedback and verbal cuing to correct postural changes.  Next session begin standing against wall UE arch and flexion to help improve posture.    PT Home Exercise Plan POE, knee to chest and standng RT hip shift.; 3/22: Decompression; cat/camel  3/29: decompression RTB           Patient will benefit from skilled therapeutic intervention in order to improve the following deficits and impairments:  Abnormal gait,Decreased activity tolerance,Decreased balance,Decreased range of motion,Decreased strength,Hypomobility,Increased fascial restricitons,Postural dysfunction,Pain  Visit Diagnosis: Abnormal posture  Muscle weakness (generalized)     Problem List Patient Active Problem List   Diagnosis Date Noted  . Perforated diverticulitis 07/04/2012  . Osteopenia 07/04/2012  . Umbilical hernia 50/09/3816   Emma Henderson, PTA/CLT [phone]  Emma Henderson 04/18/2020, 9:36 AM  [place] [address] [place], [place], [address] Phone: [phone]   Fax:  [phone]  Name: Emma Henderson MRN: [id number]  Date of Birth: [birth year]

## 2020-04-20 ENCOUNTER — Ambulatory Visit (HOSPITAL_COMMUNITY): Payer: Medicare HMO | Admitting: Physical Therapy

## 2020-04-20 ENCOUNTER — Other Ambulatory Visit: Payer: Self-pay

## 2020-04-20 DIAGNOSIS — M6281 Muscle weakness (generalized): Secondary | ICD-10-CM | POA: Diagnosis not present

## 2020-04-20 DIAGNOSIS — R293 Abnormal posture: Secondary | ICD-10-CM

## 2020-04-20 NOTE — Therapy (Signed)
[place], [place], [address] Phone: [phone]   Fax:  [phone]  Physical Therapy Treatment  Patient Details  Name: Emma Henderson MRN: [id number] Date of Birth: 1945-01-30 Referring Provider (PT): Margette Fast   Encounter Date: 04/20/2020   PT End of Session - 04/20/20 0941    Visit Number 4    Number of Visits 12    Date for PT Re-Evaluation 05/18/20    Authorization Type BCBS    PT Start Time [id number]    PT Stop Time 0920    PT Time Calculation (min) 44 min    Activity Tolerance Patient tolerated treatment well    Behavior During Therapy [facility] for tasks assessed/performed           Past Medical History:  Diagnosis Date  . Arthritis    "hands" (11/04/2012)  . Cataract   . Diverticulosis 2006  . MVEHMCNO(709.6)    "probably monthly" 11/04/2012)  . Osteopenia 05/2017   T score -1.8 FRAX 11.6% / 2.3%  . Perforated diverticulitis 07/04/2012  . Umbilical hernia 2/83/6629  . Unsteady gait   . Urinary incontinence     Past Surgical History:  Procedure Laterality Date  . CERVICAL SPINE SURGERY  ~ 1997  . COLON SURGERY    . HERNIA REPAIR  11/04/2012  . LAPAROSCOPIC PARTIAL COLECTOMY N/A 11/04/2012   Procedure: LAPAROSCOPIC SIGMOID COLECTOMY;  Surgeon: Harl Bowie, MD;  Location: [place];  Service: General;  Laterality: N/A;  . LAPAROSCOPIC SIGMOID COLECTOMY  11/04/2012   ABSCESS DIVERTICULAR     . LUMBAR SPINE SURGERY  ~ 57  . PROCTOPLASTY N/A 11/04/2012   Procedure: RIGID PROCTOSCOPY;  Surgeon: Harl Bowie, MD;  Location: [place];  Service: General;  Laterality: N/A;    There were no vitals filed for this visit.   Subjective Assessment - 04/20/20 0840    Subjective pt states she is less shaky today and having no pain.    Currently in Pain? No/denies                             [facility] Adult PT Treatment/Exercise - 04/20/20 0001      Lumbar Exercises: Stretches    Single Knee to Chest Stretch Left;Right;3 reps;30 seconds      Lumbar Exercises: Standing   Row Both;10 reps;Theraband    Theraband Level (Row) Level 2 (Red)    Shoulder Extension Both;10 reps    Theraband Level (Shoulder Extension) Level 2 (Red)    Other Standing Lumbar Exercises UE flexion against wall 10X, wall arches 10X      Lumbar Exercises: Supine   Clam 10 reps    Bridge 15 reps    Straight Leg Raise 10 reps    Straight Leg Raises Limitations slow controlled    Other Supine Lumbar Exercises decompression with theraband 5X each      Lumbar Exercises: Quadruped   Madcat/Old Horse 5 reps    Madcat/Old Horse Limitations 10" holds    Single Arm Raise 10 reps                    PT Short Term Goals - 04/06/20 1056      PT SHORT TERM GOAL #1   Title Pt to be I in HEp to improve posture    Time 3    Period Weeks    Status New  Target Date 04/20/20      PT SHORT TERM GOAL #2   Title Pt to be able to stand for 2 minutes    Time 2    Period Weeks    Status New      PT SHORT TERM GOAL #3   Title Pt to be able to single leg stance for 10 seconds to reduce risk of falling    Time 3    Period Weeks    Status New             PT Long Term Goals - 04/06/20 1058      PT LONG TERM GOAL #1   Title PT to be I in advance HEP in order to be able to stand in an erect position.    Period Weeks    Status New      PT LONG TERM GOAL #2   Title PT to be able to stand for 10 minutes in order to complete self hygeine at sink, shower,    Time 6    Period Weeks    Status New      PT LONG TERM GOAL #3   Title PT to be able to single leg stance for 15 seconds in order to decrease risk of falling    Time 6    Period Weeks    Status New      PT LONG TERM GOAL #4   Title PT hip extension and ankle dorsiflexion strength to be 4+/5 to allow ease of walking up inclines.    Time 6    Period Weeks    Status New                 Plan - 04/20/20 0959     Clinical Impression Statement Reviewed decompression theraband exercise to ensure correct form.  Noted reduction in UE shakiness with prone stretch/exercises today.  Added clams to work on core as well as standing postural TB exercises.  pt required tactile and verbal cues to maintain upright posturing with activities and required rest breaks due to fatigue.  Standing UE flexion against wall and arches added to further stretch/work postural muscles.  Noted fatigue at end of session.  Encouraged to continue ambulating and HEP. pt will be gone all next week for the beach.    Personal Factors and Comorbidities Comorbidity 2    Comorbidities cervical and lumbar surgery; cervical stenosis    Examination-Activity Limitations Bathing;Dressing;Hygiene/Grooming;Lift;Reach Overhead;Stand    Examination-Participation Restrictions Cleaning;Meal Prep;Laundry;Shop    Stability/Clinical Decision Making Evolving/Moderate complexity    Rehab Potential Good    PT Frequency 2x / week    PT Duration 6 weeks    PT Treatment/Interventions ADLs/Self Care Home Management;Functional mobility training;Therapeutic activities;Therapeutic exercise;Balance training;Neuromuscular re-education;Patient/family education;Manual techniques    PT Next Visit Plan Continue standing with feedback and verbal cuing to correct postural changes.  continue to progress standing tolerance and work on postural corrections.    PT Home Exercise Plan POE, knee to chest and standng RT hip shift.; 3/22: Decompression; cat/camel  3/29: decompression RTB           Patient will benefit from skilled therapeutic intervention in order to improve the following deficits and impairments:  Abnormal gait,Decreased activity tolerance,Decreased balance,Decreased range of motion,Decreased strength,Hypomobility,Increased fascial restricitons,Postural dysfunction,Pain  Visit Diagnosis: Abnormal posture  Muscle weakness (generalized)     Problem  List Patient Active Problem List   Diagnosis Date Noted  . Perforated diverticulitis 07/04/2012  .  Osteopenia 07/04/2012  . Umbilical hernia 73/40/3709   Teena Irani, PTA/CLT [phone] Teena Irani 04/20/2020, 10:02 AM  [place], [place], [address] Phone: [phone]   Fax:  [phone]  Name: Emma Henderson MRN: [id number] Date of Birth: [birth year]

## 2020-05-01 ENCOUNTER — Other Ambulatory Visit: Payer: Self-pay

## 2020-05-01 ENCOUNTER — Ambulatory Visit (HOSPITAL_COMMUNITY): Payer: Medicare HMO | Attending: Neurology | Admitting: Physical Therapy

## 2020-05-01 DIAGNOSIS — M6281 Muscle weakness (generalized): Secondary | ICD-10-CM | POA: Diagnosis not present

## 2020-05-01 DIAGNOSIS — R293 Abnormal posture: Secondary | ICD-10-CM | POA: Insufficient documentation

## 2020-05-01 NOTE — Therapy (Signed)
[place], [place], [address] Phone: [phone]   Fax:  [phone]  Physical Therapy Treatment  Patient Details  Name: Emma Henderson MRN: [id number] Date of Birth: [birth year] Referring Provider (PT): Margette Fast   Encounter Date: 05/01/2020   PT End of Session - 05/01/20 1705    Visit Number 5    Number of Visits 12    Date for PT Re-Evaluation 05/18/20    Authorization Type BCBS    PT Start Time 1618    PT Stop Time 1702    PT Time Calculation (min) 44 min    Activity Tolerance Patient tolerated treatment well    Behavior During Therapy [facility] for tasks assessed/performed           Past Medical History:  Diagnosis Date  . Arthritis    "hands" (11/04/2012)  . Cataract   . Diverticulosis 2006  . QMGQQPYP(950.9)    "probably monthly" 11/04/2012)  . Osteopenia 05/2017   T score -1.8 FRAX 11.6% / 2.3%  . Perforated diverticulitis 07/04/2012  . Umbilical hernia 04/16/7122  . Unsteady gait   . Urinary incontinence     Past Surgical History:  Procedure Laterality Date  . CERVICAL SPINE SURGERY  ~ 1997  . COLON SURGERY    . HERNIA REPAIR  11/04/2012  . LAPAROSCOPIC PARTIAL COLECTOMY N/A 11/04/2012   Procedure: LAPAROSCOPIC SIGMOID COLECTOMY;  Surgeon: Harl Bowie, MD;  Location: [place];  Service: General;  Laterality: N/A;  . LAPAROSCOPIC SIGMOID COLECTOMY  11/04/2012   ABSCESS DIVERTICULAR     . LUMBAR SPINE SURGERY  ~ 63  . PROCTOPLASTY N/A 11/04/2012   Procedure: RIGID PROCTOSCOPY;  Surgeon: Harl Bowie, MD;  Location: [place];  Service: General;  Laterality: N/A;    There were no vitals filed for this visit.                      [facility] Adult PT Treatment/Exercise - 05/01/20 0001      Lumbar Exercises: Stretches   Active Hamstring Stretch Right;Left;3 reps;30 seconds    Active Hamstring Stretch Limitations long sitting    Single Knee to Chest Stretch Left;Right;3  reps;30 seconds      Lumbar Exercises: Standing   Row Both;Theraband;20 reps    Theraband Level (Row) Level 2 (Red)    Row Limitations seated today due to instability    Other Standing Lumbar Exercises in corner tandem stance and SLS each LE X 3    Other Standing Lumbar Exercises gait using SPC      Lumbar Exercises: Supine   Bridge 20 reps    Bridge Limitations 5" isometric holds    Straight Leg Raise 10 reps    Straight Leg Raises Limitations slow controlled                  PT Education - 05/01/20 1706    Education Details Work on improving her tolerance; ability to remain upright.  Added stretches and standing stability to HEP.    Person(s) Educated Patient    Methods Explanation;Demonstration;Tactile cues;Verbal cues;Handout    Comprehension Verbalized understanding;Returned demonstration;Verbal cues required;Tactile cues required            PT Short Term Goals - 05/01/20 1632      PT SHORT TERM GOAL #1   Title Pt to be I in HEp to improve posture    Time 3    Period Weeks  Status Achieved    Target Date 04/20/20      PT SHORT TERM GOAL #2   Title Pt to be able to stand for 2 minutes    Time 2    Period Weeks    Status On-going      PT SHORT TERM GOAL #3   Title Pt to be able to single leg stance for 10 seconds to reduce risk of falling    Time 3    Period Weeks    Status On-going             PT Long Term Goals - 05/01/20 1633      PT LONG TERM GOAL #1   Title PT to be I in advance HEP in order to be able to stand in an erect position.    Period Weeks    Status On-going      PT LONG TERM GOAL #2   Title PT to be able to stand for 10 minutes in order to complete self hygeine at sink, shower,    Time 6    Period Weeks    Status On-going      PT LONG TERM GOAL #3   Title PT to be able to single leg stance for 15 seconds in order to decrease risk of falling    Time 6    Period Weeks    Status On-going      PT LONG TERM GOAL #4    Title PT hip extension and ankle dorsiflexion strength to be 4+/5 to allow ease of walking up inclines.    Time 6    Period Weeks    Status On-going                 Plan - 05/01/20 1707    Clinical Impression Statement Pt with increased shakiness today after having a "bump up" at the post office. Pt with noted difficulty with ambulation reaching for objects and unstable.  Introduced [facility] for pt to use at times like these and trained how to use to negotiate stairs as well.  Pt reported feeling more secure and plans on obtaining one to use during these times. Completed most exercise in seated and supine today per request due to shakiness.  Instructed with new stretches for hamstring and piriformis as she is also tight.  Did work on static balance in the corner and instructed to work on this at home.  Updated HEP to include these as well.    Personal Factors and Comorbidities Comorbidity 2    Comorbidities cervical and lumbar surgery; cervical stenosis    Examination-Activity Limitations Bathing;Dressing;Hygiene/Grooming;Lift;Reach Overhead;Stand    Examination-Participation Restrictions Cleaning;Meal Prep;Laundry;Shop    Stability/Clinical Decision Making Evolving/Moderate complexity    Rehab Potential Good    PT Frequency 2x / week    PT Duration 6 weeks    PT Treatment/Interventions ADLs/Self Care Home Management;Functional mobility training;Therapeutic activities;Therapeutic exercise;Balance training;Neuromuscular re-education;Patient/family education;Manual techniques    PT Next Visit Plan Continue standing with feedback and verbal cuing to correct postural changes.  continue to progress standing tolerance and work on postural corrections.    PT Home Exercise Plan POE, knee to chest and standng RT hip shift.; 3/22: Decompression; cat/camel  3/29: decompression RTB           Patient will benefit from skilled therapeutic intervention in order to improve the following deficits and  impairments:  Abnormal gait,Decreased activity tolerance,Decreased balance,Decreased range of motion,Decreased strength,Hypomobility,Increased fascial restricitons,Postural dysfunction,Pain  Visit  Diagnosis: Abnormal posture  Muscle weakness (generalized)     Problem List Patient Active Problem List   Diagnosis Date Noted  . Perforated diverticulitis 07/04/2012  . Osteopenia 07/04/2012  . Umbilical hernia 37/34/2876   Teena Irani, PTA/CLT [phone]  Teena Irani 05/01/2020, 5:08 PM  [facility] [address] [place], [place], [address] Phone: [phone]   Fax:  [phone]  Name: METTE SOUTHGATE MRN: [id number] Date of Birth: October 14, 1945

## 2020-05-01 NOTE — Patient Instructions (Signed)
Tandem Stance    Right foot in front of left, heel touching toe both feet "straight ahead". Stand on Foot Triangle of Support with both feet. Balance in this position _30__ seconds. Do with left foot in front of right. Repeat 3 times each leg   Hamstring Stretch (Sitting)    Sitting, extend one leg and place hands on same thigh for support. Keeping torso straight, lean forward, sliding hands down leg, until a stretch is felt in back of thigh. Hold _30__ seconds. Repeat with other leg.  Repeat 3 times each leg

## 2020-05-04 ENCOUNTER — Ambulatory Visit (HOSPITAL_COMMUNITY): Payer: Medicare HMO | Admitting: Physical Therapy

## 2020-05-04 ENCOUNTER — Other Ambulatory Visit: Payer: Self-pay

## 2020-05-04 DIAGNOSIS — M6281 Muscle weakness (generalized): Secondary | ICD-10-CM | POA: Diagnosis not present

## 2020-05-04 DIAGNOSIS — R293 Abnormal posture: Secondary | ICD-10-CM | POA: Diagnosis not present

## 2020-05-04 NOTE — Therapy (Signed)
[place], [place], [address] Phone: [phone]   Fax:  [phone]  Physical Therapy Treatment  Patient Details  Name: Emma Henderson MRN: [id number] Date of Birth: 10/30/1945 Referring Provider (PT): Margette Fast   Encounter Date: 05/04/2020   PT End of Session - 05/04/20 0945    Visit Number 6    Number of Visits 12    Date for PT Re-Evaluation 05/18/20    Authorization Type BCBS    PT Start Time 0915    PT Stop Time 1000    PT Time Calculation (min) 45 min    Activity Tolerance Patient tolerated treatment well    Behavior During Therapy [facility] for tasks assessed/performed           Past Medical History:  Diagnosis Date  . Arthritis    "hands" (11/04/2012)  . Cataract   . Diverticulosis 2006  . XMIWOEHO(122.4)    "probably monthly" 11/04/2012)  . Osteopenia 05/2017   T score -1.8 FRAX 11.6% / 2.3%  . Perforated diverticulitis 07/04/2012  . Umbilical hernia 09/15/35  . Unsteady gait   . Urinary incontinence     Past Surgical History:  Procedure Laterality Date  . CERVICAL SPINE SURGERY  ~ 1997  . COLON SURGERY    . HERNIA REPAIR  11/04/2012  . LAPAROSCOPIC PARTIAL COLECTOMY N/A 11/04/2012   Procedure: LAPAROSCOPIC SIGMOID COLECTOMY;  Surgeon: Harl Bowie, MD;  Location: [place];  Service: General;  Laterality: N/A;  . LAPAROSCOPIC SIGMOID COLECTOMY  11/04/2012   ABSCESS DIVERTICULAR     . LUMBAR SPINE SURGERY  ~ 65  . PROCTOPLASTY N/A 11/04/2012   Procedure: RIGID PROCTOSCOPY;  Surgeon: Harl Bowie, MD;  Location: [place];  Service: General;  Laterality: N/A;    There were no vitals filed for this visit.   Subjective Assessment - 05/04/20 0917    Subjective PT states that she stopped doing the basic exercises and is trying to do the more difficult ones    Pertinent History OA; cervical and lumbar surgery    How long can you sit comfortably? no problem    How long can you  stand comfortably? no time at all sits down to brushes her teeth.  Leans forward to lean against the counter    How long can you walk comfortably? Pt able to walk for 10,000 to 12,000 steps    Patient Stated Goals to be able to stand longer    Currently in Pain? Yes    Pain Score 2     Pain Location Back    Pain Orientation Lower    Pain Descriptors / Indicators Tightness    Pain Type Chronic pain    Pain Onset More than a month ago    Pain Frequency Constant    Aggravating Factors  weight bearing    Pain Relieving Factors sitting                             [facility] Adult PT Treatment/Exercise - 05/04/20 0001      Exercises   Exercises Lumbar      Lumbar Exercises: Standing   Other Standing Lumbar Exercises in corner tandem stance and SLS each LE X 3   with head turns   Other Standing Lumbar Exercises shift hip to obtain good posture and hold x 5 seconds no UE assisst; badk to wall B UE flexion x  5      Lumbar Exercises: Seated   Other Seated Lumbar Exercises using blue ball for back stretching forward, RT and LT.      Manual Therapy   Manual Therapy Soft tissue mobilization    Manual therapy comments completed seperate from all other aspects of treatment    Soft tissue mobilization to imporve back mobility, tightness and decrease pain               Balance Exercises - 05/04/20 0001      Balance Exercises: Standing   Sidestepping 2 reps    Marching Solid surface;Static;10 reps    Sit to Stand Standard surface               PT Short Term Goals - 05/01/20 1632      PT SHORT TERM GOAL #1   Title Pt to be I in HEp to improve posture    Time 3    Period Weeks    Status Achieved    Target Date 04/20/20      PT SHORT TERM GOAL #2   Title Pt to be able to stand for 2 minutes    Time 2    Period Weeks    Status On-going      PT SHORT TERM GOAL #3   Title Pt to be able to single leg stance for 10 seconds to reduce risk of falling    Time 3     Period Weeks    Status On-going             PT Long Term Goals - 05/01/20 1633      PT LONG TERM GOAL #1   Title PT to be I in advance HEP in order to be able to stand in an erect position.    Period Weeks    Status On-going      PT LONG TERM GOAL #2   Title PT to be able to stand for 10 minutes in order to complete self hygeine at sink, shower,    Time 6    Period Weeks    Status On-going      PT LONG TERM GOAL #3   Title PT to be able to single leg stance for 15 seconds in order to decrease risk of falling    Time 6    Period Weeks    Status On-going      PT LONG TERM GOAL #4   Title PT hip extension and ankle dorsiflexion strength to be 4+/5 to allow ease of walking up inclines.    Time 6    Period Weeks    Status On-going                 Plan - 05/04/20 0945    Clinical Impression Statement Session continued to work on posture, pt continues to keep hips offset to the left.  Completed balance and strengthenting with manual completed at end of treatment to assist in decreasing lumbar tightness.    Personal Factors and Comorbidities Comorbidity 2    Comorbidities cervical and lumbar surgery; cervical stenosis    Examination-Activity Limitations Bathing;Dressing;Hygiene/Grooming;Lift;Reach Overhead;Stand    Examination-Participation Restrictions Cleaning;Meal Prep;Laundry;Shop    Stability/Clinical Decision Making Evolving/Moderate complexity    Rehab Potential Good    PT Frequency 2x / week    PT Duration 6 weeks    PT Treatment/Interventions ADLs/Self Care Home Management;Functional mobility training;Therapeutic activities;Therapeutic exercise;Balance training;Neuromuscular re-education;Patient/family education;Manual techniques    PT Next  Visit Plan Continue standing with feedback and verbal cuing to correct postural changes.  continue to progress standing tolerance and work on postural corrections. manual for relaxation of mm    PT Home Exercise Plan POE,  knee to chest and standng RT hip shift.; 3/22: Decompression; cat/camel  3/29: decompression RTB           Patient will benefit from skilled therapeutic intervention in order to improve the following deficits and impairments:  Abnormal gait,Decreased activity tolerance,Decreased balance,Decreased range of motion,Decreased strength,Hypomobility,Increased fascial restricitons,Postural dysfunction,Pain  Visit Diagnosis: Muscle weakness (generalized)  Abnormal posture     Problem List Patient Active Problem List   Diagnosis Date Noted  . Perforated diverticulitis 07/04/2012  . Osteopenia 07/04/2012  . Umbilical hernia 16/07/3708   Rayetta Humphrey, PT CLT [phone] 05/04/2020, 10:07 AM  [place] [address] [place], [place], [address] Phone: [phone]   Fax:  [phone]  Name: MAIRE GOVAN MRN: [id number] Date of Birth: 1945/04/01

## 2020-05-08 ENCOUNTER — Other Ambulatory Visit: Payer: Self-pay

## 2020-05-08 ENCOUNTER — Encounter (HOSPITAL_COMMUNITY): Payer: Self-pay | Admitting: Physical Therapy

## 2020-05-08 ENCOUNTER — Ambulatory Visit (HOSPITAL_COMMUNITY): Payer: Medicare HMO | Admitting: Physical Therapy

## 2020-05-08 DIAGNOSIS — M6281 Muscle weakness (generalized): Secondary | ICD-10-CM | POA: Diagnosis not present

## 2020-05-08 DIAGNOSIS — R293 Abnormal posture: Secondary | ICD-10-CM | POA: Diagnosis not present

## 2020-05-08 NOTE — Therapy (Signed)
[place], [place], [address] Phone: [phone]   Fax:  [phone]  Physical Therapy Treatment  Patient Details  Name: Emma Henderson MRN: [id number] Date of Birth: [birth year] Referring Provider (PT): Margette Fast   Encounter Date: 05/08/2020   PT End of Session - 05/08/20 1614    Visit Number 7    Number of Visits 12    Date for PT Re-Evaluation 05/18/20    Authorization Type BCBS    PT Start Time 3235    PT Stop Time 1658    PT Time Calculation (min) 44 min    Activity Tolerance Patient tolerated treatment well    Behavior During Therapy [facility] for tasks assessed/performed           Past Medical History:  Diagnosis Date  . Arthritis    "hands" (11/04/2012)  . Cataract   . Diverticulosis 2006  . TDDUKGUR(427.0)    "probably monthly" 11/04/2012)  . Osteopenia 05/2017   T score -1.8 FRAX 11.6% / 2.3%  . Perforated diverticulitis 07/04/2012  . Umbilical hernia 07/14/7626  . Unsteady gait   . Urinary incontinence     Past Surgical History:  Procedure Laterality Date  . CERVICAL SPINE SURGERY  ~ 1997  . COLON SURGERY    . HERNIA REPAIR  11/04/2012  . LAPAROSCOPIC PARTIAL COLECTOMY N/A 11/04/2012   Procedure: LAPAROSCOPIC SIGMOID COLECTOMY;  Surgeon: Harl Bowie, MD;  Location: [place];  Service: General;  Laterality: N/A;  . LAPAROSCOPIC SIGMOID COLECTOMY  11/04/2012   ABSCESS DIVERTICULAR     . LUMBAR SPINE SURGERY  ~ 73  . PROCTOPLASTY N/A 11/04/2012   Procedure: RIGID PROCTOSCOPY;  Surgeon: Harl Bowie, MD;  Location: [place];  Service: General;  Laterality: N/A;    There were no vitals filed for this visit.   Subjective Assessment - 05/09/20 0756    Subjective Patient reports she has been doing her exercises. States she feels like she is focusing on her hip but it continues to jut out to the side.    Pertinent History OA; cervical and lumbar surgery    How long can you sit  comfortably? no problem    How long can you stand comfortably? no time at all sits down to brushes her teeth.  Leans forward to lean against the counter    How long can you walk comfortably? Pt able to walk for 10,000 to 12,000 steps    Patient Stated Goals to be able to stand longer    Pain Onset More than a month ago              [facility] PT Assessment - 05/09/20 0001      Assessment   Medical Diagnosis Gait abnormality    Referring Provider (PT) Margette Fast                         [facility] Adult PT Treatment/Exercise - 05/09/20 0001      Lumbar Exercises: Standing   Other Standing Lumbar Exercises left standing hip flexor stretch with glute isometric x10 5" holds Left- holding of to bars; lumbar side bending left 4x5 5" holds; walking with weight in left hand 10#    Other Standing Lumbar Exercises hip hike with black foam - vcisaul cues 4x5 bilateral 5-10 second holds      Lumbar Exercises: Seated   Long Arc Quad on Chair Strengthening;Both;3 sets;10 reps  while seated on blue dyno disc   Other Seated Lumbar Exercises trunk rotations to left seated - 3x5 5" holds;    Other Seated Lumbar Exercises seated on dyno disc: marching 2x15, bilateral                  PT Education - 05/09/20 0755    Education Details educated patient on use of walking stick to improve ability to maintain posture and work on core strength. on anatomy and rationale for exercises.    Person(s) Educated Patient    Methods Explanation    Comprehension Verbalized understanding            PT Short Term Goals - 05/01/20 1632      PT SHORT TERM GOAL #1   Title Pt to be I in HEp to improve posture    Time 3    Period Weeks    Status Achieved    Target Date 04/20/20      PT SHORT TERM GOAL #2   Title Pt to be able to stand for 2 minutes    Time 2    Period Weeks    Status On-going      PT SHORT TERM GOAL #3   Title Pt to be able to single leg stance for 10 seconds to reduce  risk of falling    Time 3    Period Weeks    Status On-going             PT Long Term Goals - 05/01/20 1633      PT LONG TERM GOAL #1   Title PT to be I in advance HEP in order to be able to stand in an erect position.    Period Weeks    Status On-going      PT LONG TERM GOAL #2   Title PT to be able to stand for 10 minutes in order to complete self hygeine at sink, shower,    Time 6    Period Weeks    Status On-going      PT LONG TERM GOAL #3   Title PT to be able to single leg stance for 15 seconds in order to decrease risk of falling    Time 6    Period Weeks    Status On-going      PT LONG TERM GOAL #4   Title PT hip extension and ankle dorsiflexion strength to be 4+/5 to allow ease of walking up inclines.    Time 6    Period Weeks    Status On-going                 Plan - 05/08/20 1614    Clinical Impression Statement Patient tolerated session well. Observed patient to collapse into right trunk rotation and side bending, focused on improving this with hip and core strengthening as well as posture exercises. Fatigue noted end of session. Instructed patient to use walking stick with walking to improve overall gait strategies. Will continue to focus on this next session.    Personal Factors and Comorbidities Comorbidity 2    Comorbidities cervical and lumbar surgery; cervical stenosis    Examination-Activity Limitations Bathing;Dressing;Hygiene/Grooming;Lift;Reach Overhead;Stand    Examination-Participation Restrictions Cleaning;Meal Prep;Laundry;Shop    Stability/Clinical Decision Making Evolving/Moderate complexity    Rehab Potential Good    PT Frequency 2x / week    PT Duration 6 weeks    PT Treatment/Interventions ADLs/Self Care Home Management;Functional mobility training;Therapeutic activities;Therapeutic exercise;Balance training;Neuromuscular  re-education;Patient/family education;Manual techniques    PT Next Visit Plan Continue standing with feedback  and verbal cuing to correct postural changes.  continue to progress standing tolerance and work on postural corrections. manual for relaxation of mm    PT Home Exercise Plan POE, knee to chest and standng RT hip shift.; 3/22: Decompression; cat/camel  3/29: decompression RTB; 4/19 trunk SB, trunk ROT, walking with walking stick           Patient will benefit from skilled therapeutic intervention in order to improve the following deficits and impairments:  Abnormal gait,Decreased activity tolerance,Decreased balance,Decreased range of motion,Decreased strength,Hypomobility,Increased fascial restricitons,Postural dysfunction,Pain  Visit Diagnosis: Muscle weakness (generalized)  Abnormal posture     Problem List Patient Active Problem List   Diagnosis Date Noted  . Perforated diverticulitis 07/04/2012  . Osteopenia 07/04/2012  . Umbilical hernia 56/72/0919    8:07 AM, 05/09/20 Jerene Pitch, DPT Physical Therapy with [facility]  [phone] office  [place] [address] [place], [place], [address] Phone: [phone]   Fax:  [phone]  Name: Emma Henderson MRN: [id number] Date of Birth: 01/31/1945

## 2020-05-11 ENCOUNTER — Encounter (HOSPITAL_COMMUNITY): Payer: BC Managed Care – PPO | Admitting: Physical Therapy

## 2020-05-15 ENCOUNTER — Ambulatory Visit (HOSPITAL_COMMUNITY): Payer: Medicare HMO | Admitting: Physical Therapy

## 2020-05-15 ENCOUNTER — Encounter (HOSPITAL_COMMUNITY): Payer: Self-pay | Admitting: Physical Therapy

## 2020-05-15 ENCOUNTER — Encounter (HOSPITAL_COMMUNITY): Payer: BC Managed Care – PPO | Admitting: Physical Therapy

## 2020-05-15 ENCOUNTER — Other Ambulatory Visit: Payer: Self-pay

## 2020-05-15 DIAGNOSIS — M6281 Muscle weakness (generalized): Secondary | ICD-10-CM

## 2020-05-15 DIAGNOSIS — R293 Abnormal posture: Secondary | ICD-10-CM | POA: Diagnosis not present

## 2020-05-15 NOTE — Therapy (Signed)
[place], [place], [address] Phone: [phone]   Fax:  [phone]  Physical Therapy Treatment  Patient Details  Name: Emma Henderson MRN: [id number] Date of Birth: [birth year] Referring Provider (PT): Margette Fast   Encounter Date: 05/15/2020   PT End of Session - 05/15/20 0744    Visit Number 8    Number of Visits 12    Date for PT Re-Evaluation 05/18/20    Authorization Type BCBS    PT Start Time 0745    PT Stop Time 0825    PT Time Calculation (min) 40 min    Activity Tolerance Patient tolerated treatment well    Behavior During Therapy [facility] for tasks assessed/performed           Past Medical History:  Diagnosis Date  . Arthritis    "hands" (11/04/2012)  . Cataract   . Diverticulosis 2006  . CBJSEGBT(517.6)    "probably monthly" 11/04/2012)  . Osteopenia 05/2017   T score -1.8 FRAX 11.6% / 2.3%  . Perforated diverticulitis 07/04/2012  . Umbilical hernia 1/60/7371  . Unsteady gait   . Urinary incontinence     Past Surgical History:  Procedure Laterality Date  . CERVICAL SPINE SURGERY  ~ 1997  . COLON SURGERY    . HERNIA REPAIR  11/04/2012  . LAPAROSCOPIC PARTIAL COLECTOMY N/A 11/04/2012   Procedure: LAPAROSCOPIC SIGMOID COLECTOMY;  Surgeon: Harl Bowie, MD;  Location: [place];  Service: General;  Laterality: N/A;  . LAPAROSCOPIC SIGMOID COLECTOMY  11/04/2012   ABSCESS DIVERTICULAR     . LUMBAR SPINE SURGERY  ~ 86  . PROCTOPLASTY N/A 11/04/2012   Procedure: RIGID PROCTOSCOPY;  Surgeon: Harl Bowie, MD;  Location: [place];  Service: General;  Laterality: N/A;    There were no vitals filed for this visit.   Subjective Assessment - 05/15/20 0749    Subjective States she got back from the beach and is ready to work. States she tried to do her exercises but didn't do it as frequently as she would have liked.    Pertinent History OA; cervical and lumbar surgery    How long can you  sit comfortably? no problem    How long can you stand comfortably? no time at all sits down to brushes her teeth.  Leans forward to lean against the counter    How long can you walk comfortably? Pt able to walk for 10,000 to 12,000 steps    Patient Stated Goals to be able to stand longer    Currently in Pain? No/denies    Pain Onset More than a month ago              [facility] PT Assessment - 05/15/20 0001      Assessment   Medical Diagnosis Gait abnormality    Referring Provider (PT) Margette Fast                         [facility] Adult PT Treatment/Exercise - 05/15/20 0001      Lumbar Exercises: Standing   Other Standing Lumbar Exercises walking - fast focus on form x2 laps of 226 feet no AD    Other Standing Lumbar Exercises R hip hike (left stance leg) with 2" step - Visual cues 4x5 bilateral 5-10 second holds      Lumbar Exercises: Seated   Other Seated Lumbar Exercises seated on ball - too challenging- big ball with  PT assist---> seated on ball chair - pelvic tilts lateral and anterior/posterior 4x5 5" holds bilteral, then posture holds with SBA and 30" holds x3  narrow BOS                    PT Short Term Goals - 05/01/20 1632      PT SHORT TERM GOAL #1   Title Pt to be I in HEp to improve posture    Time 3    Period Weeks    Status Achieved    Target Date 04/20/20      PT SHORT TERM GOAL #2   Title Pt to be able to stand for 2 minutes    Time 2    Period Weeks    Status On-going      PT SHORT TERM GOAL #3   Title Pt to be able to single leg stance for 10 seconds to reduce risk of falling    Time 3    Period Weeks    Status On-going             PT Long Term Goals - 05/01/20 1633      PT LONG TERM GOAL #1   Title PT to be I in advance HEP in order to be able to stand in an erect position.    Period Weeks    Status On-going      PT LONG TERM GOAL #2   Title PT to be able to stand for 10 minutes in order to complete self hygeine at  sink, shower,    Time 6    Period Weeks    Status On-going      PT LONG TERM GOAL #3   Title PT to be able to single leg stance for 15 seconds in order to decrease risk of falling    Time 6    Period Weeks    Status On-going      PT LONG TERM GOAL #4   Title PT hip extension and ankle dorsiflexion strength to be 4+/5 to allow ease of walking up inclines.    Time 6    Period Weeks    Status On-going                 Plan - 05/15/20 0745    Clinical Impression Statement Improved walking mechanics noted with walking today. Discussed not using cane/walking stick moving forward. Focused on pelvic mobility on ball which was challenging, able to perform  on ball chair with SBA and increased safety. Took longer rest breaks during session but fatigue in legs noted end of session. Will follow up with posture next session.    Personal Factors and Comorbidities Comorbidity 2    Comorbidities cervical and lumbar surgery; cervical stenosis    Examination-Activity Limitations Bathing;Dressing;Hygiene/Grooming;Lift;Reach Overhead;Stand    Examination-Participation Restrictions Cleaning;Meal Prep;Laundry;Shop    Stability/Clinical Decision Making Evolving/Moderate complexity    Rehab Potential Good    PT Frequency 2x / week    PT Duration 6 weeks    PT Treatment/Interventions ADLs/Self Care Home Management;Functional mobility training;Therapeutic activities;Therapeutic exercise;Balance training;Neuromuscular re-education;Patient/family education;Manual techniques    PT Next Visit Plan Continue standing with feedback and verbal cuing to correct postural changes.  continue to progress standing tolerance and work on postural corrections. manual for relaxation of mm    PT Home Exercise Plan POE, knee to chest and standng RT hip shift.; 3/22: Decompression; cat/camel  3/29: decompression RTB; 4/19 trunk SB, trunk ROT, walking  with walking stick           Patient will benefit from skilled  therapeutic intervention in order to improve the following deficits and impairments:  Abnormal gait,Decreased activity tolerance,Decreased balance,Decreased range of motion,Decreased strength,Hypomobility,Increased fascial restricitons,Postural dysfunction,Pain  Visit Diagnosis: Muscle weakness (generalized)  Abnormal posture     Problem List Patient Active Problem List   Diagnosis Date Noted  . Perforated diverticulitis 07/04/2012  . Osteopenia 07/04/2012  . Umbilical hernia 28/31/5176    8:29 AM, 05/15/20 Jerene Pitch, DPT Physical Therapy with [facility]  [phone] office  [place] [address] [place], [place], [address] Phone: [phone]   Fax:  [phone]  Name: Emma Henderson MRN: [id number] Date of Birth: [birth year]

## 2020-05-17 ENCOUNTER — Ambulatory Visit (HOSPITAL_COMMUNITY): Payer: Medicare HMO | Admitting: Physical Therapy

## 2020-05-17 ENCOUNTER — Other Ambulatory Visit: Payer: Self-pay

## 2020-05-17 ENCOUNTER — Encounter (HOSPITAL_COMMUNITY): Payer: Self-pay | Admitting: Physical Therapy

## 2020-05-17 DIAGNOSIS — M6281 Muscle weakness (generalized): Secondary | ICD-10-CM

## 2020-05-17 DIAGNOSIS — R293 Abnormal posture: Secondary | ICD-10-CM | POA: Diagnosis not present

## 2020-05-17 NOTE — Therapy (Signed)
[facility] [address] [place], [place], [address] Phone: [phone]   Fax:  [phone]  Physical Therapy Treatment and Progress Note and RECERT  Patient Details  Name: Emma Henderson MRN: [id number] Date of Birth: [birth year] Referring Provider (PT): Margette Fast  Progress Note Reporting Period 04/06/20 to 05/17/20  See note below for Objective Data and Assessment of Progress/Goals.       Encounter Date: 05/17/2020   PT End of Session - 05/17/20 0832    Visit Number 9    Number of Visits 15    Date for PT Re-Evaluation 06/28/20    Authorization Type BCBS    Progress Note Due on Visit 30    PT Start Time 0832    PT Stop Time 0912    PT Time Calculation (min) 40 min    Activity Tolerance Patient tolerated treatment well    Behavior During Therapy [facility] for tasks assessed/performed           Past Medical History:  Diagnosis Date  . Arthritis    "hands" (11/04/2012)  . Cataract   . Diverticulosis 2006  . ONGEXBMW(413.2)    "probably monthly" 11/04/2012)  . Osteopenia 05/2017   T score -1.8 FRAX 11.6% / 2.3%  . Perforated diverticulitis 07/04/2012  . Umbilical hernia 4/40/1027  . Unsteady gait   . Urinary incontinence     Past Surgical History:  Procedure Laterality Date  . CERVICAL SPINE SURGERY  ~ 1997  . COLON SURGERY    . HERNIA REPAIR  11/04/2012  . LAPAROSCOPIC PARTIAL COLECTOMY N/A 11/04/2012   Procedure: LAPAROSCOPIC SIGMOID COLECTOMY;  Surgeon: Harl Bowie, MD;  Location: [facility];  Service: General;  Laterality: N/A;  . LAPAROSCOPIC SIGMOID COLECTOMY  11/04/2012   ABSCESS DIVERTICULAR     . LUMBAR SPINE SURGERY  ~ 26  . PROCTOPLASTY N/A 11/04/2012   Procedure: RIGID PROCTOSCOPY;  Surgeon: Harl Bowie, MD;  Location: [facility];  Service: General;  Laterality: N/A;    There were no vitals filed for this visit.   Subjective Assessment - 05/17/20 0834    Subjective States that overall she feels  about 50% better since the start of PT because she still can't raise one foot. States she tries to do her exercises every day.    Pertinent History OA; cervical and lumbar surgery    How long can you sit comfortably? no problem    How long can you stand comfortably? no time at all sits down to brushes her teeth.  Leans forward to lean against the counter    How long can you walk comfortably? Pt able to walk for 10,000 to 12,000 steps    Patient Stated Goals to be able to stand longer    Pain Onset More than a month ago              [facility] PT Assessment - 05/17/20 0001      Assessment   Medical Diagnosis Gait abnormality    Referring Provider (PT) Margette Fast      Single Leg Stance   Comments 6 seconds on either leg   previously Rt 2 seconds lt 1 second     Strength   Right Hip Flexion 5/5    Left Hip Flexion 5/5    Left Hip Extension 4+/5    Right Knee Flexion 5/5    Right Knee Extension 5/5    Left Knee Flexion 5/5    Left Knee  Extension 5/5    Right Ankle Dorsiflexion 5/5    Left Ankle Dorsiflexion 5/5                         [facility] Adult PT Treatment/Exercise - 05/17/20 0001      Lumbar Exercises: Seated   Other Seated Lumbar Exercises deadbug seated isometric 2x8 5" holds      Lumbar Exercises: Prone   Other Prone Lumbar Exercises knee flexion x15 5" holds bilaterally                  PT Education - 05/17/20 0950    Education Details on current presentation and progress made while in therapy and continued progress to be made, HEP.    Person(s) Educated Patient    Methods Explanation    Comprehension Verbalized understanding            PT Short Term Goals - 05/17/20 0836      PT SHORT TERM GOAL #1   Title Pt to be I in HEp to improve posture    Time 3    Period Weeks    Status Achieved    Target Date 04/20/20      PT SHORT TERM GOAL #2   Title Pt to be able to stand for 2 minutes    Time 2    Period Weeks    Status Achieved       PT SHORT TERM GOAL #3   Title Pt to be able to single leg stance for 10 seconds to reduce risk of falling    Time 3    Period Weeks    Status On-going             PT Long Term Goals - 05/17/20 0849      PT LONG TERM GOAL #1   Title PT to be I in advance HEP in order to be able to stand in an erect position.    Period Weeks    Status On-going      PT LONG TERM GOAL #2   Title PT to be able to stand for 10 minutes in order to complete self hygeine at sink, shower,    Time 6    Period Weeks    Status On-going      PT LONG TERM GOAL #3   Title PT to be able to single leg stance for 15 seconds in order to decrease risk of falling    Time 6    Period Weeks    Status On-going      PT LONG TERM GOAL #4   Title PT hip extension and ankle dorsiflexion strength to be 4+/5 to allow ease of walking up inclines.    Time 6    Period Weeks    Status On-going                 Plan - 05/17/20 0834    Clinical Impression Statement Progress note performed on this date and progress in strength noted and in ability to stand on one leg. Two short term goals met and progressing towards remaining goals. Patient is working on her posture and home exercises diligently at home and would continue to benefit from skilled physical therapy to improve overall function. Extending POC to work towards remaining goals.    Personal Factors and Comorbidities Comorbidity 2    Comorbidities cervical and lumbar surgery; cervical stenosis    Examination-Activity Limitations Bathing;Dressing;Hygiene/Grooming;Lift;Reach Overhead;Stand  Examination-Participation Restrictions Cleaning;Meal Prep;Laundry;Shop    Stability/Clinical Decision Making Evolving/Moderate complexity    Rehab Potential Good    PT Frequency Other (comment)   1-2x/week for total of 6 visits over the next 6 weeks   PT Duration 6 weeks    PT Treatment/Interventions ADLs/Self Care Home Management;Functional mobility  training;Therapeutic activities;Therapeutic exercise;Balance training;Neuromuscular re-education;Patient/family education;Manual techniques    PT Next Visit Plan Continue standing with feedback and verbal cuing to correct postural changes.  continue to progress standing tolerance and work on postural corrections. manual for relaxation of mm    PT Home Exercise Plan POE, knee to chest and standng RT hip shift.; 3/22: Decompression; cat/camel  3/29: decompression RTB; 4/19 trunk SB, trunk ROT, walking with walking stick           Patient will benefit from skilled therapeutic intervention in order to improve the following deficits and impairments:  Abnormal gait,Decreased activity tolerance,Decreased balance,Decreased range of motion,Decreased strength,Hypomobility,Increased fascial restricitons,Postural dysfunction,Pain  Visit Diagnosis: Muscle weakness (generalized)  Abnormal posture     Problem List Patient Active Problem List   Diagnosis Date Noted  . Perforated diverticulitis 07/04/2012  . Osteopenia 07/04/2012  . Umbilical hernia 30/12/3797    9:51 AM, 05/17/20 Jerene Pitch, DPT Physical Therapy with [facility]  [phone] office  [facility] [address] [place], [place], [address] Phone: [phone]   Fax:  [phone]  Name: Emma Henderson MRN: [id number] Date of Birth: May 03, 1945

## 2020-05-18 ENCOUNTER — Encounter (HOSPITAL_COMMUNITY): Payer: BC Managed Care – PPO | Admitting: Physical Therapy

## 2020-05-22 ENCOUNTER — Encounter (HOSPITAL_COMMUNITY): Payer: BC Managed Care – PPO | Admitting: Physical Therapy

## 2020-05-23 ENCOUNTER — Other Ambulatory Visit: Payer: Self-pay

## 2020-05-23 ENCOUNTER — Ambulatory Visit (HOSPITAL_COMMUNITY): Payer: Medicare HMO | Attending: Neurology | Admitting: Physical Therapy

## 2020-05-23 ENCOUNTER — Encounter (HOSPITAL_COMMUNITY): Payer: Self-pay | Admitting: Physical Therapy

## 2020-05-23 DIAGNOSIS — M6281 Muscle weakness (generalized): Secondary | ICD-10-CM

## 2020-05-23 DIAGNOSIS — R293 Abnormal posture: Secondary | ICD-10-CM | POA: Diagnosis not present

## 2020-05-23 NOTE — Therapy (Signed)
[facility] [place], [place], [address] Phone: [phone]   Fax:  [phone]  Physical Therapy Treatment  Patient Details  Name: Emma Henderson MRN: [id number] Date of Birth: Oct 17, 1945 Referring Provider (PT): Margette Fast   Encounter Date: 05/23/2020   PT End of Session - 05/23/20 0832    Visit Number 10    Number of Visits 15    Date for PT Re-Evaluation 06/28/20    Authorization Type BCBS    Progress Note Due on Visit 19    PT Start Time 0832    PT Stop Time 0915    PT Time Calculation (min) 43 min    Activity Tolerance Patient tolerated treatment well    Behavior During Therapy [facility] for tasks assessed/performed           Past Medical History:  Diagnosis Date  . Arthritis    "hands" (11/04/2012)  . Cataract   . Diverticulosis 2006  . IOEVOJJK(093.8)    "probably monthly" 11/04/2012)  . Osteopenia 05/2017   T score -1.8 FRAX 11.6% / 2.3%  . Perforated diverticulitis 07/04/2012  . Umbilical hernia 1/82/9937  . Unsteady gait   . Urinary incontinence     Past Surgical History:  Procedure Laterality Date  . CERVICAL SPINE SURGERY  ~ 1997  . COLON SURGERY    . HERNIA REPAIR  11/04/2012  . LAPAROSCOPIC PARTIAL COLECTOMY N/A 11/04/2012   Procedure: LAPAROSCOPIC SIGMOID COLECTOMY;  Surgeon: Harl Bowie, MD;  Location: [facility];  Service: General;  Laterality: N/A;  . LAPAROSCOPIC SIGMOID COLECTOMY  11/04/2012   ABSCESS DIVERTICULAR     . LUMBAR SPINE SURGERY  ~ 50  . PROCTOPLASTY N/A 11/04/2012   Procedure: RIGID PROCTOSCOPY;  Surgeon: Harl Bowie, MD;  Location: [facility];  Service: General;  Laterality: N/A;    There were no vitals filed for this visit.   Subjective Assessment - 05/23/20 0835    Subjective States her exercises are going alright but she has been busy.    Pertinent History OA; cervical and lumbar surgery    How long can you sit comfortably? no problem    How long can you stand  comfortably? no time at all sits down to brushes her teeth.  Leans forward to lean against the counter    How long can you walk comfortably? Pt able to walk for 10,000 to 12,000 steps    Patient Stated Goals to be able to stand longer    Currently in Pain? No/denies    Pain Onset More than a month ago              [facility] PT Assessment - 05/23/20 0001      Assessment   Medical Diagnosis Gait abnormality    Referring Provider (PT) Margette Fast                         [facility] Adult PT Treatment/Exercise - 05/23/20 0001      Lumbar Exercises: Standing   Other Standing Lumbar Exercises SLS with 1 UE suppoet with visual cues x3 30" holds - very challenging    Other Standing Lumbar Exercises standing planks at table - very challenging on arms - transitioned to regular planks on forarms - standing self mobilization with tennis ball to hips and back - very challenging on legs - transitioned to percussion gun      Lumbar Exercises: Seated   Other Seated  Lumbar Exercises self mobilization to hips/back with percussion gun with flat side attachment - 5 minutes - felt good      Lumbar Exercises: Sidelying   Other Sidelying Lumbar Exercises s/l over bosu - with overhead reah for stretch - ten isometric hold x4 5" holds B in each position      Lumbar Exercises: Prone   Other Prone Lumbar Exercises planks on elbows 10 seconds x3 - cues to shift weight right - trialed hip dips in plank position and this casued back pain      Manual Therapy   Manual Therapy Soft tissue mobilization    Manual therapy comments completed seperate from all other aspects of treatment    Soft tissue mobilization to B glute meds, QLs - tolerated well - no pain with transition to sitting upright from prone afterwards                  PT Education - 05/23/20 0922    Education Details on benefits of self mobilization, on use of percussion gun, on how and when to use it    Person(s) Educated Patient     Methods Explanation    Comprehension Verbalized understanding            PT Short Term Goals - 05/17/20 0836      PT SHORT TERM GOAL #1   Title Pt to be I in HEp to improve posture    Time 3    Period Weeks    Status Achieved    Target Date 04/20/20      PT SHORT TERM GOAL #2   Title Pt to be able to stand for 2 minutes    Time 2    Period Weeks    Status Achieved      PT SHORT TERM GOAL #3   Title Pt to be able to single leg stance for 10 seconds to reduce risk of falling    Time 3    Period Weeks    Status On-going             PT Long Term Goals - 05/17/20 0849      PT LONG TERM GOAL #1   Title PT to be I in advance HEP in order to be able to stand in an erect position.    Period Weeks    Status On-going      PT LONG TERM GOAL #2   Title PT to be able to stand for 10 minutes in order to complete self hygeine at sink, shower,    Time 6    Period Weeks    Status On-going      PT LONG TERM GOAL #3   Title PT to be able to single leg stance for 15 seconds in order to decrease risk of falling    Time 6    Period Weeks    Status On-going      PT LONG TERM GOAL #4   Title PT hip extension and ankle dorsiflexion strength to be 4+/5 to allow ease of walking up inclines.    Time 6    Period Weeks    Status On-going                 Plan - 05/23/20 8295    Clinical Impression Statement Patient with good tolerance to side planks and side stretch over bosu. Continued pain with transition from prone to sitting edge of bed. Performed STM to lumbar spine and  pain with transition abolished. Trialed self mobilization with tennis ball but this was too challenging for patient. Transitioned to self mobilization with percussion gun and this was tolerated very well with patient with interest in getting one as it would be less then getting regular massages. Educated patient on when to use percussion gun (pain managements and prior to performing stretches/  strengthening exercises). Answers all questions, will continue with current POC.    Personal Factors and Comorbidities Comorbidity 2    Comorbidities cervical and lumbar surgery; cervical stenosis    Examination-Activity Limitations Bathing;Dressing;Hygiene/Grooming;Lift;Reach Overhead;Stand    Examination-Participation Restrictions Cleaning;Meal Prep;Laundry;Shop    Stability/Clinical Decision Making Evolving/Moderate complexity    Rehab Potential Good    PT Frequency Other (comment)   1-2x/week for total of 6 visits over the next 6 weeks   PT Duration 6 weeks    PT Treatment/Interventions ADLs/Self Care Home Management;Functional mobility training;Therapeutic activities;Therapeutic exercise;Balance training;Neuromuscular re-education;Patient/family education;Manual techniques    PT Next Visit Plan oblique strengthening, Continue standing with feedback and verbal cuing to correct postural changes.  continue to progress standing tolerance and work on postural corrections. manual for relaxation of mm    PT Home Exercise Plan POE, knee to chest and standng RT hip shift.; 3/22: Decompression; cat/camel  3/29: decompression RTB; 4/19 trunk SB, trunk ROT, walking with walking stick           Patient will benefit from skilled therapeutic intervention in order to improve the following deficits and impairments:  Abnormal gait,Decreased activity tolerance,Decreased balance,Decreased range of motion,Decreased strength,Hypomobility,Increased fascial restricitons,Postural dysfunction,Pain  Visit Diagnosis: Muscle weakness (generalized)  Abnormal posture     Problem List Patient Active Problem List   Diagnosis Date Noted  . Perforated diverticulitis 07/04/2012  . Osteopenia 07/04/2012  . Umbilical hernia 21/11/5518    9:27 AM, 05/23/20 Jerene Pitch, DPT Physical Therapy with [facility]  [phone] office  [facility] [address] [place], [place], [address] Phone: [phone]   Fax:  [phone]  Name: Emma Henderson MRN: [id number] Date of Birth: 1945/11/23

## 2020-05-25 ENCOUNTER — Encounter (HOSPITAL_COMMUNITY): Payer: Self-pay | Admitting: Physical Therapy

## 2020-05-25 ENCOUNTER — Ambulatory Visit (HOSPITAL_COMMUNITY): Payer: Medicare HMO | Admitting: Physical Therapy

## 2020-05-25 ENCOUNTER — Other Ambulatory Visit: Payer: Self-pay

## 2020-05-25 DIAGNOSIS — R293 Abnormal posture: Secondary | ICD-10-CM

## 2020-05-25 DIAGNOSIS — M6281 Muscle weakness (generalized): Secondary | ICD-10-CM | POA: Diagnosis not present

## 2020-05-25 NOTE — Therapy (Signed)
[facility], [place], [address] Phone: [phone]   Fax:  [phone]  Physical Therapy Treatment  Patient Details  Name: Emma Henderson MRN: [id number] Date of Birth: [birth year] Referring Provider (PT): Margette Fast   Encounter Date: 05/25/2020   PT End of Session - 05/25/20 0832    Visit Number 11    Number of Visits 15    Date for PT Re-Evaluation 06/28/20    Authorization Type BCBS    Progress Note Due on Visit 19    PT Start Time 0832    PT Stop Time 0911    PT Time Calculation (min) 39 min    Activity Tolerance Patient tolerated treatment well    Behavior During Therapy [facility] for tasks assessed/performed           Past Medical History:  Diagnosis Date  . Arthritis    "hands" (11/04/2012)  . Cataract   . Diverticulosis 2006  . OEVOJJKK(938.1)    "probably monthly" 11/04/2012)  . Osteopenia 05/2017   T score -1.8 FRAX 11.6% / 2.3%  . Perforated diverticulitis 07/04/2012  . Umbilical hernia 09/18/9369  . Unsteady gait   . Urinary incontinence     Past Surgical History:  Procedure Laterality Date  . CERVICAL SPINE SURGERY  ~ 1997  . COLON SURGERY    . HERNIA REPAIR  11/04/2012  . LAPAROSCOPIC PARTIAL COLECTOMY N/A 11/04/2012   Procedure: LAPAROSCOPIC SIGMOID COLECTOMY;  Surgeon: Harl Bowie, MD;  Location: [facility];  Service: General;  Laterality: N/A;  . LAPAROSCOPIC SIGMOID COLECTOMY  11/04/2012   ABSCESS DIVERTICULAR     . LUMBAR SPINE SURGERY  ~ 74  . PROCTOPLASTY N/A 11/04/2012   Procedure: RIGID PROCTOSCOPY;  Surgeon: Harl Bowie, MD;  Location: [facility];  Service: General;  Laterality: N/A;    There were no vitals filed for this visit.   Subjective Assessment - 05/25/20 0900    Subjective States today she feels a little off balance. States she looked at the different models and plans on buying one.    Pertinent History OA; cervical and lumbar surgery    How long can you sit  comfortably? no problem    How long can you stand comfortably? no time at all sits down to brushes her teeth.  Leans forward to lean against the counter    How long can you walk comfortably? Pt able to walk for 10,000 to 12,000 steps    Patient Stated Goals to be able to stand longer    Currently in Pain? No/denies    Pain Onset More than a month ago              [facility] PT Assessment - 05/25/20 0001      Assessment   Medical Diagnosis Gait abnormality    Referring Provider (PT) Margette Fast                         [facility] Adult PT Treatment/Exercise - 05/25/20 0001      Lumbar Exercises: Seated   Other Seated Lumbar Exercises SB trunk movements - with holds x15 5" holds; trunk rotations x8 5" holds Bilateral.    Other Seated Lumbar Exercises trunk rotation lawn mower pul 1 weight plate 2x15 5" bilateral - verbal and tactile cues      Manual Therapy   Manual Therapy Soft tissue mobilization    Manual therapy comments completed seperate from  all other aspects of treatment    Soft tissue mobilization IASTM to glutes and lumbar paraspinals - tolerated well.                  PT Education - 05/25/20 0911    Education Details on use of walking stick when she fels more off balanced for safety    Person(s) Educated Patient    Methods Explanation    Comprehension Verbalized understanding            PT Short Term Goals - 05/17/20 0836      PT SHORT TERM GOAL #1   Title Pt to be I in HEp to improve posture    Time 3    Period Weeks    Status Achieved    Target Date 04/20/20      PT SHORT TERM GOAL #2   Title Pt to be able to stand for 2 minutes    Time 2    Period Weeks    Status Achieved      PT SHORT TERM GOAL #3   Title Pt to be able to single leg stance for 10 seconds to reduce risk of falling    Time 3    Period Weeks    Status On-going             PT Long Term Goals - 05/17/20 0849      PT LONG TERM GOAL #1   Title PT to be I in  advance HEP in order to be able to stand in an erect position.    Period Weeks    Status On-going      PT LONG TERM GOAL #2   Title PT to be able to stand for 10 minutes in order to complete self hygeine at sink, shower,    Time 6    Period Weeks    Status On-going      PT LONG TERM GOAL #3   Title PT to be able to single leg stance for 15 seconds in order to decrease risk of falling    Time 6    Period Weeks    Status On-going      PT LONG TERM GOAL #4   Title PT hip extension and ankle dorsiflexion strength to be 4+/5 to allow ease of walking up inclines.    Time 6    Period Weeks    Status On-going                 Plan - 05/25/20 0912    Clinical Impression Statement Patient with increased off balance today. Discussed using walking stick with walking secondary to increased balance difficulties today and searching for furniture as she walked. Added trunk rotations with resistance, tolerated well but required verbal and tactile cues to perform with good form. Performed soft tissue mobilization and patient reported reduced difficulties walking afterwards. Will continue with trunk rotations and manual mobilizations as tolerated.    Personal Factors and Comorbidities Comorbidity 2    Comorbidities cervical and lumbar surgery; cervical stenosis    Examination-Activity Limitations Bathing;Dressing;Hygiene/Grooming;Lift;Reach Overhead;Stand    Examination-Participation Restrictions Cleaning;Meal Prep;Laundry;Shop    Stability/Clinical Decision Making Evolving/Moderate complexity    Rehab Potential Good    PT Frequency Other (comment)   1-2x/week for total of 6 visits over the next 6 weeks   PT Duration 6 weeks    PT Treatment/Interventions ADLs/Self Care Home Management;Functional mobility training;Therapeutic activities;Therapeutic exercise;Balance training;Neuromuscular re-education;Patient/family education;Manual techniques  PT Next Visit Plan oblique strengthening, Continue  standing with feedback and verbal cuing to correct postural changes.  continue to progress standing tolerance and work on postural corrections. manual for relaxation of mm    PT Home Exercise Plan POE, knee to chest and standng RT hip shift.; 3/22: Decompression; cat/camel  3/29: decompression RTB; 4/19 trunk SB, trunk ROT, walking with walking stick           Patient will benefit from skilled therapeutic intervention in order to improve the following deficits and impairments:  Abnormal gait,Decreased activity tolerance,Decreased balance,Decreased range of motion,Decreased strength,Hypomobility,Increased fascial restricitons,Postural dysfunction,Pain  Visit Diagnosis: Muscle weakness (generalized)  Abnormal posture     Problem List Patient Active Problem List   Diagnosis Date Noted  . Perforated diverticulitis 07/04/2012  . Osteopenia 07/04/2012  . Umbilical hernia 31/49/7026    9:13 AM, 05/25/20 Jerene Pitch, DPT Physical Therapy with [facility]  [phone] office   [facility] [address] [place], [place], [address] Phone: [phone]   Fax:  [phone]  Name: Emma Henderson MRN: [id number] Date of Birth: 1946-01-18

## 2020-05-29 ENCOUNTER — Encounter (HOSPITAL_COMMUNITY): Payer: BC Managed Care – PPO | Admitting: Physical Therapy

## 2020-06-01 ENCOUNTER — Other Ambulatory Visit: Payer: Self-pay

## 2020-06-01 ENCOUNTER — Ambulatory Visit (HOSPITAL_COMMUNITY): Payer: Medicare HMO | Admitting: Physical Therapy

## 2020-06-01 DIAGNOSIS — R293 Abnormal posture: Secondary | ICD-10-CM

## 2020-06-01 DIAGNOSIS — M6281 Muscle weakness (generalized): Secondary | ICD-10-CM | POA: Diagnosis not present

## 2020-06-01 NOTE — Therapy (Signed)
[place], [place], [address] Phone: [phone]   Fax:  [phone]  Physical Therapy Treatment  Patient Details  Name: Emma Henderson MRN: [id number] Date of Birth: 27-Jun-1945 Referring Provider (PT): Margette Fast   Encounter Date: 06/01/2020   PT End of Session - 06/01/20 1352    Visit Number 12    Number of Visits 15    Date for PT Re-Evaluation 06/28/20    Authorization Type BCBS    Progress Note Due on Visit 15    PT Start Time 0830    PT Stop Time 0920    PT Time Calculation (min) 50 min    Activity Tolerance Patient tolerated treatment well    Behavior During Therapy [facility] for tasks assessed/performed           Past Medical History:  Diagnosis Date  . Arthritis    "hands" (11/04/2012)  . Cataract   . Diverticulosis 2006  . FIEPPIRJ(188.4)    "probably monthly" 11/04/2012)  . Osteopenia 05/2017   T score -1.8 FRAX 11.6% / 2.3%  . Perforated diverticulitis 07/04/2012  . Umbilical hernia 1/66/0630  . Unsteady gait   . Urinary incontinence     Past Surgical History:  Procedure Laterality Date  . CERVICAL SPINE SURGERY  ~ 1997  . COLON SURGERY    . HERNIA REPAIR  11/04/2012  . LAPAROSCOPIC PARTIAL COLECTOMY N/A 11/04/2012   Procedure: LAPAROSCOPIC SIGMOID COLECTOMY;  Surgeon: Harl Bowie, MD;  Location: [place];  Service: General;  Laterality: N/A;  . LAPAROSCOPIC SIGMOID COLECTOMY  11/04/2012   ABSCESS DIVERTICULAR     . LUMBAR SPINE SURGERY  ~ 69  . PROCTOPLASTY N/A 11/04/2012   Procedure: RIGID PROCTOSCOPY;  Surgeon: Harl Bowie, MD;  Location: [place];  Service: General;  Laterality: N/A;    There were no vitals filed for this visit.   Subjective Assessment - 06/01/20 0832    Subjective Pt states that she is alittle stiff but no pain.    Pertinent History OA; cervical and lumbar surgery    How long can you sit comfortably? no problem    How long can you stand  comfortably? no time at all sits down to brushes her teeth.  Leans forward to lean against the counter    How long can you walk comfortably? Pt able to walk for 10,000 to 12,000 steps    Patient Stated Goals to be able to stand longer    Currently in Pain? No/denies    Pain Onset More than a month ago                   [facility] Adult PT Treatment/Exercise - 06/01/20 0001      Ambulation/Gait   Ambulation/Gait Yes    Ambulation/Gait Assistance --   walking stick   Ambulation Distance (Feet) 226 Feet    Gait Comments 113 without walking stick               Balance Exercises - 06/01/20 0001      Balance Exercises: Standing   SLS Eyes open;3 reps    Marching Solid surface;Static;10 reps    Other Standing Exercises UE flexion back to wall x 10; wall push up x 10             PT Education - 06/01/20 0910    Education Details Pt brought walking stick in to work on actually does better without; Pt brought  the percussion gun that she bought to review how she is to use it, updated HEP    Person(s) Educated Patient    Methods Explanation;Handout    Comprehension Verbalized understanding            PT Short Term Goals - 05/17/20 0836      PT SHORT TERM GOAL #1   Title Pt to be I in HEp to improve posture    Time 3    Period Weeks    Status Achieved    Target Date 04/20/20      PT SHORT TERM GOAL #2   Title Pt to be able to stand for 2 minutes    Time 2    Period Weeks    Status Achieved      PT SHORT TERM GOAL #3   Title Pt to be able to single leg stance for 10 seconds to reduce risk of falling    Time 3    Period Weeks    Status On-going             PT Long Term Goals - 05/17/20 0849      PT LONG TERM GOAL #1   Title PT to be I in advance HEP in order to be able to stand in an erect position.    Period Weeks    Status On-going      PT LONG TERM GOAL #2   Title PT to be able to stand for 10 minutes in order to complete self hygeine at sink,  shower,    Time 6    Period Weeks    Status On-going      PT LONG TERM GOAL #3   Title PT to be able to single leg stance for 15 seconds in order to decrease risk of falling    Time 6    Period Weeks    Status On-going      PT LONG TERM GOAL #4   Title PT hip extension and ankle dorsiflexion strength to be 4+/5 to allow ease of walking up inclines.    Time 6    Period Weeks    Status On-going                 Plan - 06/01/20 1352    Clinical Impression Statement Pt comes with walking stick and percussion gun today wanting to know how to use both.  PT very tense using the walking stick and states that she feels that she walks better without it and is going to hold off using it for now.  PT has numerous questions about percussion gun which were attempted to be answered by therapist.  Jamal Maes stressed that the gun is just a tool to allow pt to move with less pain by warming up the mm but it itself will not change her gait or ability to stand.  Added new exercises and HEP.    Personal Factors and Comorbidities Comorbidity 2    Comorbidities cervical and lumbar surgery; cervical stenosis    Examination-Activity Limitations Bathing;Dressing;Hygiene/Grooming;Lift;Reach Overhead;Stand    Examination-Participation Restrictions Cleaning;Meal Prep;Laundry;Shop    Stability/Clinical Decision Making Evolving/Moderate complexity    Rehab Potential Good    PT Frequency Other (comment)   1-2x/week for total of 6 visits over the next 6 weeks   PT Duration 6 weeks    PT Treatment/Interventions ADLs/Self Care Home Management;Functional mobility training;Therapeutic activities;Therapeutic exercise;Balance training;Neuromuscular re-education;Patient/family education;Manual techniques    PT Next Visit Plan oblique strengthening,  Continue standing with feedback and verbal cuing to correct postural changes.  continue to progress standing tolerance and work on postural corrections. manual for relaxation  of mm    PT Home Exercise Plan POE, knee to chest and standng RT hip shift.; 3/22: Decompression; cat/camel  3/29: decompression RTB; 4/19 trunk SB, trunk ROT, walking with walking stick; 5/12:  SLS in corner, Lt sidelying with roll above hip and abduction of RT arm to stretch Lt side, marching in place.           Patient will benefit from skilled therapeutic intervention in order to improve the following deficits and impairments:  Abnormal gait,Decreased activity tolerance,Decreased balance,Decreased range of motion,Decreased strength,Hypomobility,Increased fascial restricitons,Postural dysfunction,Pain  Visit Diagnosis: Muscle weakness (generalized)  Abnormal posture     Problem List Patient Active Problem List   Diagnosis Date Noted  . Perforated diverticulitis 07/04/2012  . Osteopenia 07/04/2012  . Umbilical hernia 60/63/0160  Rayetta Humphrey, PT CLT [phone] 06/01/2020, 1:58 PM  [place], [place], [address] Phone: [phone]   Fax:  [phone]  Name: Emma Henderson MRN: [id number] Date of Birth: [birth year]

## 2020-06-07 ENCOUNTER — Ambulatory Visit (HOSPITAL_COMMUNITY): Payer: Medicare HMO | Admitting: Physical Therapy

## 2020-06-07 ENCOUNTER — Encounter (HOSPITAL_COMMUNITY): Payer: Self-pay | Admitting: Physical Therapy

## 2020-06-07 ENCOUNTER — Other Ambulatory Visit: Payer: Self-pay

## 2020-06-07 DIAGNOSIS — R293 Abnormal posture: Secondary | ICD-10-CM

## 2020-06-07 DIAGNOSIS — M6281 Muscle weakness (generalized): Secondary | ICD-10-CM

## 2020-06-07 NOTE — Therapy (Signed)
[place], [place], [address] Phone: [phone]   Fax:  [phone]  Physical Therapy Treatment  Patient Details  Name: Emma Henderson MRN: [id number] Date of Birth: 12-17-1945 Referring Provider (PT): Margette Fast   Encounter Date: 06/07/2020   PT End of Session - 06/07/20 0832    Visit Number 13    Number of Visits 15    Date for PT Re-Evaluation 06/28/20    Authorization Type BCBS    Progress Note Due on Visit 15    PT Start Time 0832    PT Stop Time 0914    PT Time Calculation (min) 42 min    Activity Tolerance Patient tolerated treatment well    Behavior During Therapy [facility] for tasks assessed/performed           Past Medical History:  Diagnosis Date  . Arthritis    "hands" (11/04/2012)  . Cataract   . Diverticulosis 2006  . CZYSAYTK(160.1)    "probably monthly" 11/04/2012)  . Osteopenia 05/2017   T score -1.8 FRAX 11.6% / 2.3%  . Perforated diverticulitis 07/04/2012  . Umbilical hernia 0/93/2355  . Unsteady gait   . Urinary incontinence     Past Surgical History:  Procedure Laterality Date  . CERVICAL SPINE SURGERY  ~ 1997  . COLON SURGERY    . HERNIA REPAIR  11/04/2012  . LAPAROSCOPIC PARTIAL COLECTOMY N/A 11/04/2012   Procedure: LAPAROSCOPIC SIGMOID COLECTOMY;  Surgeon: Harl Bowie, MD;  Location: [place];  Service: General;  Laterality: N/A;  . LAPAROSCOPIC SIGMOID COLECTOMY  11/04/2012   ABSCESS DIVERTICULAR     . LUMBAR SPINE SURGERY  ~ 17  . PROCTOPLASTY N/A 11/04/2012   Procedure: RIGID PROCTOSCOPY;  Surgeon: Harl Bowie, MD;  Location: [place];  Service: General;  Laterality: N/A;    There were no vitals filed for this visit.       [facility] PT Assessment - 06/07/20 0001      Assessment   Medical Diagnosis Gait abnormality    Referring Provider (PT) Margette Fast                         [facility] Adult PT Treatment/Exercise - 06/07/20 0001       Lumbar Exercises: Standing   Other Standing Lumbar Exercises TRUNK SB at wall with verbal and tactile cues to reduce flexion/rotation - 5 minutes      Lumbar Exercises: Seated   Other Seated Lumbar Exercises seated on exercise ball sitting tall with narrow BOS, then with horizontal shoulder abd working on posture - 15 minutes total    Other Seated Lumbar Exercises cross body reaches and holds x3 for 1.5 minutes; towel shoulder extension x20 5" holds                  PT Education - 06/07/20 0914    Education Details on current presentation, on plan next session and reviewing HEP next session.    Person(s) Educated Patient    Methods Explanation    Comprehension Verbalized understanding            PT Short Term Goals - 05/17/20 0836      PT SHORT TERM GOAL #1   Title Pt to be I in HEp to improve posture    Time 3    Period Weeks    Status Achieved    Target Date 04/20/20  PT SHORT TERM GOAL #2   Title Pt to be able to stand for 2 minutes    Time 2    Period Weeks    Status Achieved      PT SHORT TERM GOAL #3   Title Pt to be able to single leg stance for 10 seconds to reduce risk of falling    Time 3    Period Weeks    Status On-going             PT Long Term Goals - 05/17/20 0849      PT LONG TERM GOAL #1   Title PT to be I in advance HEP in order to be able to stand in an erect position.    Period Weeks    Status On-going      PT LONG TERM GOAL #2   Title PT to be able to stand for 10 minutes in order to complete self hygeine at sink, shower,    Time 6    Period Weeks    Status On-going      PT LONG TERM GOAL #3   Title PT to be able to single leg stance for 15 seconds in order to decrease risk of falling    Time 6    Period Weeks    Status On-going      PT LONG TERM GOAL #4   Title PT hip extension and ankle dorsiflexion strength to be 4+/5 to allow ease of walking up inclines.    Time 6    Period Weeks    Status On-going                  Plan - 06/07/20 0915    Clinical Impression Statement Patient continues to favor trunk flexion with exercises and requires tactile and verbal cues to correct. Fatigue in legs after seated in exercise ball chair. Overall patients walking improved with exercises but overall unsteadiness increased. Discussed using walking stick ONLY when she feels like she is going to fall as with new exercises ROM comes first and then strength so unsteadiness is expected. Will review HEP next session.    Personal Factors and Comorbidities Comorbidity 2    Comorbidities cervical and lumbar surgery; cervical stenosis    Examination-Activity Limitations Bathing;Dressing;Hygiene/Grooming;Lift;Reach Overhead;Stand    Examination-Participation Restrictions Cleaning;Meal Prep;Laundry;Shop    Stability/Clinical Decision Making Evolving/Moderate complexity    Rehab Potential Good    PT Frequency Other (comment)   1-2x/week for total of 6 visits over the next 6 weeks   PT Duration 6 weeks    PT Treatment/Interventions ADLs/Self Care Home Management;Functional mobility training;Therapeutic activities;Therapeutic exercise;Balance training;Neuromuscular re-education;Patient/family education;Manual techniques    PT Next Visit Plan reassess next session, review HEP. oblique strengthening, Continue standing with feedback and verbal cuing to correct postural changes.  continue to progress standing tolerance and work on postural corrections. manual for relaxation of mm    PT Home Exercise Plan POE, knee to chest and standng RT hip shift.; 3/22: Decompression; cat/camel  3/29: decompression RTB; 4/19 trunk SB, trunk ROT, walking with walking stick; 5/12:  SLS in corner, Lt sidelying with roll above hip and abduction of RT arm to stretch Lt side, marching in place.           Patient will benefit from skilled therapeutic intervention in order to improve the following deficits and impairments:  Abnormal gait,Decreased  activity tolerance,Decreased balance,Decreased range of motion,Decreased strength,Hypomobility,Increased fascial restricitons,Postural dysfunction,Pain  Visit Diagnosis: Muscle weakness (generalized)  Abnormal posture     Problem List Patient Active Problem List   Diagnosis Date Noted  . Perforated diverticulitis 07/04/2012  . Osteopenia 07/04/2012  . Umbilical hernia 38/75/6433   9:16 AM, 06/07/20 Jerene Pitch, DPT Physical Therapy with [facility]  [phone] office  [place] [address] [place], [place], [address] Phone: [phone]   Fax:  [phone]  Name: ESABELLA STOCKINGER MRN: [id number] Date of Birth: 1945-04-28

## 2020-06-08 ENCOUNTER — Encounter (HOSPITAL_COMMUNITY): Payer: BC Managed Care – PPO | Admitting: Physical Therapy

## 2020-06-14 ENCOUNTER — Other Ambulatory Visit: Payer: Self-pay

## 2020-06-14 ENCOUNTER — Ambulatory Visit (HOSPITAL_COMMUNITY): Payer: Medicare HMO | Admitting: Physical Therapy

## 2020-06-14 DIAGNOSIS — R293 Abnormal posture: Secondary | ICD-10-CM | POA: Diagnosis not present

## 2020-06-14 DIAGNOSIS — M6281 Muscle weakness (generalized): Secondary | ICD-10-CM | POA: Diagnosis not present

## 2020-06-14 NOTE — Therapy (Signed)
[place], [place], [address] Phone: [phone]   Fax:  [phone]  Physical Therapy Treatment  Patient Details  Name: Emma Henderson MRN: [id number] Date of Birth: 01/28/1945 Referring Provider (PT): Margette Fast  PHYSICAL THERAPY DISCHARGE SUMMARY  Visits from Start of Care: 14  Current functional level related to goals / functional outcomes: PT has better posture awareness but still shifts hips to the Lt with walking    Remaining deficits: Shifting with prolong walking or standing    Education / Equipment: HEP explained that it will take months for these mm to strengthen.  Plan: Patient agrees to discharge.  Patient goals were not met. Patient is being discharged due to meeting the stated rehab goals.  ?????       Encounter Date: 06/14/2020   PT End of Session - 06/14/20 1104    Visit Number 14    Number of Visits 14    Date for PT Re-Evaluation 06/28/20    Authorization Type BCBS    PT Stop Time 0915    Activity Tolerance Patient tolerated treatment well    Behavior During Therapy [name] for tasks assessed/performed           Past Medical History:  Diagnosis Date  . Arthritis    "hands" (11/04/2012)  . Cataract   . Diverticulosis 2006  . ZSWFUXNA(355.7)    "probably monthly" 11/04/2012)  . Osteopenia 05/2017   T score -1.8 FRAX 11.6% / 2.3%  . Perforated diverticulitis 07/04/2012  . Umbilical hernia 04/11/252  . Unsteady gait   . Urinary incontinence     Past Surgical History:  Procedure Laterality Date  . CERVICAL SPINE SURGERY  ~ 1997  . COLON SURGERY    . HERNIA REPAIR  11/04/2012  . LAPAROSCOPIC PARTIAL COLECTOMY N/A 11/04/2012   Procedure: LAPAROSCOPIC SIGMOID COLECTOMY;  Surgeon: Harl Bowie, MD;  Location: [place];  Service: General;  Laterality: N/A;  . LAPAROSCOPIC SIGMOID COLECTOMY  11/04/2012   ABSCESS DIVERTICULAR     . LUMBAR SPINE SURGERY  ~ 82  .  PROCTOPLASTY N/A 11/04/2012   Procedure: RIGID PROCTOSCOPY;  Surgeon: Harl Bowie, MD;  Location: [place];  Service: General;  Laterality: N/A;    There were no vitals filed for this visit.   Subjective Assessment - 06/14/20 0842    Subjective Pt feels that she is about 60% improved in her walking but her balance is no more than 10% improved    Pertinent History OA; cervical and lumbar surgery    How long can you sit comfortably? no problem    How long can you stand comfortably? able to stand for 2 minutes properly; no time at all sits down to brushes her teeth.  Leans forward to lean against the counter    How long can you walk comfortably? Pt able to walk for 10,000 to 12,000 steps    Patient Stated Goals to be able to stand longer    Currently in Pain? No/denies              [facility] PT Assessment - 06/14/20 0001      Assessment   Medical Diagnosis Gait abnormality    Referring Provider (PT) Margette Fast      Single Leg Stance   Comments --   4/27:  was 6 seconds B;  previously Rt 2 seconds lt 1 second     Strength   Right Hip Flexion 5/5  Left Hip Flexion 5/5    Left Hip Extension 5/5    Right Knee Flexion 5/5    Right Knee Extension 5/5    Left Knee Flexion 5/5    Left Knee Extension 5/5    Right Ankle Dorsiflexion 5/5    Left Ankle Dorsiflexion 5/5                         [facility] Adult PT Treatment/Exercise - 06/14/20 0001      Ambulation/Gait   Ambulation/Gait Yes    Ambulation Distance (Feet) 226 Feet    Gait Comments manual resistance to LT hip to keep pt in line      Lumbar Exercises: Stretches   Prone on Elbows Stretch 1 rep   5 minutes   Press Ups 5 reps      Lumbar Exercises: Standing   Other Standing Lumbar Exercises SLS with 1 UE suppoet with visual cues x3 30" holds - very challenging    Other Standing Lumbar Exercises Posture awareness with both prolong standing and walking      Lumbar Exercises: Sidelying   Other Sidelying  Lumbar Exercises LT plank x 3 forr 5 seconds                    PT Short Term Goals - 06/14/20 1105      PT SHORT TERM GOAL #1   Title Pt to be I in HEp to improve posture    Time 3    Period Weeks    Status Achieved    Target Date 04/20/20      PT SHORT TERM GOAL #2   Title Pt to be able to stand for 2 minutes    Time 2    Period Weeks    Status Achieved      PT SHORT TERM GOAL #3   Title Pt to be able to single leg stance for 10 seconds to reduce risk of falling    Time 3    Period Weeks    Status On-going             PT Long Term Goals - 06/14/20 1106      PT LONG TERM GOAL #1   Title PT to be I in advance HEP in order to be able to stand in an erect position.    Period Weeks    Status Achieved      PT LONG TERM GOAL #2   Title PT to be able to stand for 10 minutes in order to complete self hygeine at sink, shower,    Time 6    Period Weeks    Status Not Met      PT LONG TERM GOAL #3   Title PT to be able to single leg stance for 15 seconds in order to decrease risk of falling    Time 6    Period Weeks    Status Not Met      PT LONG TERM GOAL #4   Title PT hip extension and ankle dorsiflexion strength to be 4+/5 to allow ease of walking up inclines.    Time 6    Period Weeks    Status Achieved                  Patient will benefit from skilled therapeutic intervention in order to improve the following deficits and impairments:     Visit Diagnosis: Abnormal posture  Problem List Patient Active Problem List   Diagnosis Date Noted  . Perforated diverticulitis 07/04/2012  . Osteopenia 07/04/2012  . Umbilical hernia 39/79/5369   Emma Henderson, PT CLT [phone] 06/14/2020, 11:07 AM  [place] [address] [place], [place], [address] Phone: [phone]   Fax:  [phone]  Name: Emma Henderson MRN: [id number] Date of Birth: 1945-05-09

## 2020-06-27 ENCOUNTER — Ambulatory Visit (INDEPENDENT_AMBULATORY_CARE_PROVIDER_SITE_OTHER): Payer: Medicare HMO | Admitting: Neurology

## 2020-06-27 ENCOUNTER — Encounter: Payer: Self-pay | Admitting: Neurology

## 2020-06-27 DIAGNOSIS — R269 Unspecified abnormalities of gait and mobility: Secondary | ICD-10-CM

## 2020-06-27 HISTORY — DX: Unspecified abnormalities of gait and mobility: R26.9

## 2020-06-27 NOTE — Progress Notes (Signed)
Reason for visit: Gait disorder  Emma Henderson is an 75 y.o. female  History of present illness:  Emma Henderson is a 75 year old right-handed white female with a history of a mild gait disorder.  The patient was noted to have an abnormal posture with rotation of the upper body to the right and a drop of the right shoulder.  The patient is undergoing physical therapy which has helped some.  The patient has also been noted to have a moderate level of small vessel disease by MRI of the brain, she is now on low-dose aspirin.  She has monitor her blood pressures which rarely go over 720 systolic.  The patient does not smoke, but she does have a family history of cerebrovascular disease.  The patient has not had any falls since last seen.  She continues to walk on a regular basis, for 5 miles a day.  She has no balance issues while walking, but when she stands still she feels unsteady.  She comes to the office today for further evaluation.  Past Medical History:  Diagnosis Date  . Arthritis    "hands" (11/04/2012)  . Cataract   . Diverticulosis 2006  . NOBSJGGE(366.2)    "probably monthly" 11/04/2012)  . Osteopenia 05/2017   T score -1.8 FRAX 11.6% / 2.3%  . Perforated diverticulitis 07/04/2012  . Umbilical hernia 9/47/6546  . Unsteady gait   . Urinary incontinence     Past Surgical History:  Procedure Laterality Date  . CERVICAL SPINE SURGERY  ~ 1997  . COLON SURGERY    . HERNIA REPAIR  11/04/2012  . LAPAROSCOPIC PARTIAL COLECTOMY N/A 11/04/2012   Procedure: LAPAROSCOPIC SIGMOID COLECTOMY;  Surgeon: Harl Bowie, MD;  Location: [facility];  Service: General;  Laterality: N/A;  . LAPAROSCOPIC SIGMOID COLECTOMY  11/04/2012   ABSCESS DIVERTICULAR     . LUMBAR SPINE SURGERY  ~ 3  . PROCTOPLASTY N/A 11/04/2012   Procedure: RIGID PROCTOSCOPY;  Surgeon: Harl Bowie, MD;  Location: [facility];  Service: General;  Laterality: N/A;    Family History  Problem Relation Age of Onset  .  Heart disease Mother        heart attacks died with second one  . Stroke Father        died of a stroke  . Heart disease Father        had two MI's  . Other Father        nonmalignant brain tumor  . Heart disease Brother     Social history:  reports that she quit smoking about 50 years ago. Her smoking use included cigarettes. She has a 20.00 pack-year smoking history. She has never used smokeless tobacco. She reports current alcohol use. She reports that she does not use drugs.   No Known Allergies  Medications:  Prior to Admission medications   Medication Sig Start Date End Date Taking? Authorizing Provider  acetaminophen (TYLENOL) 500 MG tablet Take 500 mg by mouth every 6 (six) hours as needed for pain.   Yes [provider]  aspirin EC 81 MG tablet Take 81 mg by mouth daily. Swallow whole.   Yes [provider]  ibuprofen (ADVIL) 200 MG tablet Take 200 mg by mouth every 6 (six) hours as needed.   Yes [provider]    ROS:  Out of a complete 14 system review of symptoms, the patient complains only of the following symptoms, and all other reviewed systems are negative.  Walking difficulty  Blood pressure 128/75, pulse 72, height 5\' 3"  (1.6 m), weight 141 lb 9.6 oz (64.2 kg), last menstrual period 01/21/1998.  Physical Exam  General: The patient is alert and cooperative at the time of the examination.  Skin: No significant peripheral edema is noted.   Neurologic Exam  Mental status: The patient is alert and oriented x 3 at the time of the examination. The patient has apparent normal recent and remote memory, with an apparently normal attention span and concentration ability.   Cranial nerves: Facial symmetry is present. Speech is normal, no aphasia or dysarthria is noted. Extraocular movements are full. Visual fields are full.  Motor: The patient has good strength in all 4 extremities.  Sensory examination: Soft touch sensation is  symmetric on the face, arms, and legs.  Coordination: The patient has good finger-nose-finger and heel-to-shin bilaterally.  Gait and station: The patient has a slightly wide-based gait.  Tandem gait is slightly unsteady.  Romberg is negative.  With standing, the patient again has slight rotation of the upper body to the right.  Reflexes: Deep tendon reflexes are symmetric.   XR lumbar 02/25/20:  IMPRESSION: Severe multilevel degenerative disc disease. No acute abnormality seen in the lumbar spine.   XR thoracic 02/25/20:  IMPRESSION: Negative.   MRI brain 03/23/20:  IMPRESSION: 1. No acute intracranial abnormality. 2. Scattered and confluent foci of T2 hyperintensity within the white matter of the cerebral hemispheres, nonspecific but may represent moderate microvascular ischemic changes. 3. Mild bilateral mastoid effusion.  * MRI scan images were reviewed online. I agree with the written report.    MRI cervical 03/23/20:  IMPRESSION: 1. Multilevel degenerative changes of the cervical spine as above, most prominent at C5-6 where there is mild spinal canal stenosis with moderate right and severe left neural foraminal narrowing. 2. Moderate neural foraminal narrowing on the right at C3-4 and C4-5.   Assessment/Plan:  1.  Mild gait disorder  2.  Small vessel disease by MRI brain  The patient does have at least a moderate level small vessel disease that may be an additive factor with her walking.  Her postural changes may also be playing into her mild gait instability.  We will need to monitor her walking over time, she will follow-up here in about 6 months, she may see Dr. Krista Blue in follow-up.  Jill Alexanders MD 06/27/2020 9:39 AM  [facility] [address] [place], [place] [address]  Phone [phone] Fax [phone]

## 2020-08-15 DIAGNOSIS — H9201 Otalgia, right ear: Secondary | ICD-10-CM | POA: Diagnosis not present

## 2020-09-07 DIAGNOSIS — H259 Unspecified age-related cataract: Secondary | ICD-10-CM | POA: Diagnosis not present

## 2020-10-27 ENCOUNTER — Other Ambulatory Visit (HOSPITAL_COMMUNITY): Payer: Self-pay | Admitting: Internal Medicine

## 2020-10-27 DIAGNOSIS — Z1231 Encounter for screening mammogram for malignant neoplasm of breast: Secondary | ICD-10-CM

## 2020-12-18 ENCOUNTER — Ambulatory Visit (HOSPITAL_COMMUNITY)
Admission: RE | Admit: 2020-12-18 | Discharge: 2020-12-18 | Disposition: A | Discharge status: Home / Self Care | Payer: Medicare HMO | Source: Ambulatory Visit | Attending: Internal Medicine | Admitting: Internal Medicine

## 2020-12-18 ENCOUNTER — Other Ambulatory Visit: Payer: Self-pay

## 2020-12-18 DIAGNOSIS — Z1231 Encounter for screening mammogram for malignant neoplasm of breast: Secondary | ICD-10-CM | POA: Diagnosis not present

## 2021-01-03 NOTE — Progress Notes (Addendum)
PATIENT: Emma Henderson DOB: [birth year]  REASON FOR VISIT: Follow up HISTORY FROM: Patient PRIMARY NEUROLOGIST: Dr. Krista Blue since Dr. Jannifer Franklin has retired  HISTORY OF PRESENT ILLNESS: Today 01/04/21 Emma Henderson is here today for follow-up for mild gait disorder, abnormal posture with rotation of the upper body to the right and drop of right shoulder. MRI brain showed moderate level SVD, on aspirin now. Has completed PT with some benefit.  May have an occasional fall.  She walks outside daily usually around 1.5 hours.  Denies any issues with balance with walking, but if she stands still in once place, her left leg feels weak. This is post lumbar spine surgery. None of her symptoms are new, have been present for at least 15 years. Lives alone, drives a car. Remains active. Seeing PCP in January. Manual check of BP 140/80.  HISTORY  06/27/2020 Dr. Jannifer Franklin: Emma Henderson is a 75 year old right-handed white female with a history of a mild gait disorder.  The patient was noted to have an abnormal posture with rotation of the upper body to the right and a drop of the right shoulder.  The patient is undergoing physical therapy which has helped some.  The patient has also been noted to have a moderate level of small vessel disease by MRI of the brain, she is now on low-dose aspirin.  She has monitor her blood pressures which rarely go over 119 systolic.  The patient does not smoke, but she does have a family history of cerebrovascular disease.  The patient has not had any falls since last seen.  She continues to walk on a regular basis, for 5 miles a day.  She has no balance issues while walking, but when she stands still she feels unsteady.  She comes to the office today for further evaluation.  REVIEW OF SYSTEMS: Out of a complete 14 system review of symptoms, the patient complains only of the following symptoms, and all other reviewed systems are negative.  N/a  ALLERGIES: No Known Allergies  HOME  MEDICATIONS: Outpatient Medications Prior to Visit  Medication Sig Dispense Refill   acetaminophen (TYLENOL) 500 MG tablet Take 500 mg by mouth every 6 (six) hours as needed for pain.     aspirin EC 81 MG tablet Take 81 mg by mouth daily. Swallow whole.     ibuprofen (ADVIL) 200 MG tablet Take 200 mg by mouth every 6 (six) hours as needed.     No facility-administered medications prior to visit.    PAST MEDICAL HISTORY: Past Medical History:  Diagnosis Date   Arthritis    "hands" (11/04/2012)   Cataract    Diverticulosis 2006   Gait abnormality 06/27/2020   Headache(784.0)    "probably monthly" 11/04/2012)   Osteopenia 05/2017   T score -1.8 FRAX 11.6% / 2.3%   Perforated diverticulitis 1/47/8295   Umbilical hernia 07/11/3084   Unsteady gait    Urinary incontinence     PAST SURGICAL HISTORY: Past Surgical History:  Procedure Laterality Date   CERVICAL SPINE SURGERY  ~ [place]  11/04/2012   LAPAROSCOPIC PARTIAL COLECTOMY N/A 11/04/2012   Procedure: LAPAROSCOPIC SIGMOID COLECTOMY;  Surgeon: Harl Bowie, MD;  Location: [facility];  Service: General;  Laterality: N/A;   LAPAROSCOPIC SIGMOID COLECTOMY  11/04/2012   ABSCESS DIVERTICULAR      LUMBAR SPINE SURGERY  ~ 1987   PROCTOPLASTY N/A 11/04/2012   Procedure: RIGID PROCTOSCOPY;  Surgeon:  Harl Bowie, MD;  Location: [facility];  Service: General;  Laterality: N/A;    FAMILY HISTORY: Family History  Problem Relation Age of Onset   Heart disease Mother        heart attacks died with second one   Stroke Father        died of a stroke   Heart disease Father        had two MI's   Other Father        nonmalignant brain tumor   Heart disease Brother     SOCIAL HISTORY: Social History   Socioeconomic History   Marital status: Widowed    Spouse name: Not on file   Number of children: 2   Years of education: Not on file   Highest education level: Not on file  Occupational History    Occupation: teacher    Employer: RETIRED  Tobacco Use   Smoking status: Former    Packs/day: 2.00    Years: 10.00    Pack years: 20.00    Types: Cigarettes    Quit date: 01/21/1970    Years since quitting: 50.9   Smokeless tobacco: Never   Tobacco comments:    11/04/2012 "stopped at age 37"  Vaping Use   Vaping Use: Never used  Substance and Sexual Activity   Alcohol use: Yes    Comment: 1-2/day   Drug use: No   Sexual activity: Never    Birth control/protection: Post-menopausal    Comment: 1st intercourse 41 yo-1 partner  Other Topics Concern   Not on file  Social History Narrative   Lives alone   Right handed   Drinks 2-3 cups coffee daily   Social Determinants of Health   Financial Resource Strain: Not on file  Food Insecurity: Not on file  Transportation Needs: Not on file  Physical Activity: Not on file  Stress: Not on file  Social Connections: Not on file  Intimate Partner Violence: Not on file   PHYSICAL EXAM  Vitals:   01/04/21 0952  BP: (!) 155/74  Pulse: 71  Weight: 141 lb (64 kg)  Height: 5\' 3"  (1.6 m)   Body mass index is 24.98 kg/m.  Generalized: Well developed, in no acute distress   Neurological examination  Mentation: Alert oriented to time, place, history taking. Follows all commands speech and language fluent, good historian, very pleasant Cranial nerve II-XII: Pupils were equal round reactive to light. Extraocular movements were full, visual field were full on confrontational test. Facial sensation and strength were normal. Head turning and shoulder shrug  were normal and symmetric. At rest right shoulder is slightly lower than left, but she corrects it Motor: The motor testing reveals 5 over 5 strength of all 4 extremities. Good symmetric motor tone is noted throughout.  Sensory: Sensory testing is intact to soft touch on all 4 extremities. No evidence of extinction is noted.  Coordination: Cerebellar testing reveals good finger-nose-finger  and heel-to-shin bilaterally.  Gait and station: Can stand from seated position without pushoff, gait is slightly wide-based, her feet nearly cross over with walking, slight rotation of upper body to the right Reflexes: Deep tendon reflexes are symmetric and normal bilaterally.   DIAGNOSTIC DATA (LABS, IMAGING, TESTING) - I reviewed patient records, labs, notes, testing and imaging myself where available.  Lab Results  Component Value Date   WBC 14.2 (H) 11/05/2012   HGB 12.5 11/05/2012   HCT 37.3 11/05/2012   MCV 84.2 11/05/2012   PLT 243  11/05/2012      Component Value Date/Time   NA 136 11/05/2012 0431   K 4.6 11/05/2012 0431   CL 103 11/05/2012 0431   CO2 20 11/05/2012 0431   GLUCOSE 118 (H) 11/05/2012 0431   BUN 12 11/05/2012 0431   CREATININE 0.64 11/05/2012 0431   CALCIUM 8.7 11/05/2012 0431   PROT 7.2 06/29/2012 0926   ALBUMIN 3.2 (L) 06/29/2012 0926   AST 18 06/29/2012 0926   ALT 18 06/29/2012 0926   ALKPHOS 81 06/29/2012 0926   BILITOT 0.3 06/29/2012 0926   GFRNONAA >90 11/05/2012 0431   GFRAA >90 11/05/2012 0431   No results found for: CHOL, HDL, LDLCALC, LDLDIRECT, TRIG, CHOLHDL No results found for: HGBA1C Lab Results  Component Value Date   VITAMINB12 549 02/24/2020   No results found for: TSH  ASSESSMENT AND PLAN 75 y.o. year old female  has a past medical history of Arthritis, Cataract, Diverticulosis (2006), Gait abnormality (06/27/2020), Headache(784.0), Osteopenia (05/2017), Perforated diverticulitis (05/24/5463), Umbilical hernia (6/81/2751), Unsteady gait, and Urinary incontinence. here with:  Mild gait disorder  2.   Small Vessel Disease by MRI of the brain   -Will follow her walking overtime, currently she is doing well, still walking 1.5 hr outside daily -Completed PT without much benefit, but admits not a great PT patient -Recommend continue aspirin, follow-up with PCP for management of vascular risk factors, checking BP to ensure < 700  systolic -Xray of the back showed severe degenerative changes, no true scoliosis was seen Feb 2022 -MRI of the brain showed moderate level small vessel disease -MRI of the cervical spine showed some spinal stenosis at C5-6 level -I will see her back in 1 year or sooner if needed, she will let me know of any changes, otherwise we will follow her walking over time -Dr. Jannifer Franklin has retired, she will be followed by Dr. Marilynn Latino, AGNP-C, DNP 01/04/2021, 10:30 AM [facility] [address], [place], [place] [address] [phone]

## 2021-01-04 ENCOUNTER — Encounter: Payer: Self-pay | Admitting: Neurology

## 2021-01-04 ENCOUNTER — Ambulatory Visit (INDEPENDENT_AMBULATORY_CARE_PROVIDER_SITE_OTHER): Payer: Medicare HMO | Admitting: Neurology

## 2021-01-04 VITALS — BP 155/74 | HR 71 | Ht 63.0 in | Wt 141.0 lb

## 2021-01-04 DIAGNOSIS — I679 Cerebrovascular disease, unspecified: Secondary | ICD-10-CM | POA: Insufficient documentation

## 2021-01-04 DIAGNOSIS — R269 Unspecified abnormalities of gait and mobility: Secondary | ICD-10-CM

## 2021-01-04 NOTE — Progress Notes (Signed)
Chart reviewed, agree above plan ?

## 2021-01-04 NOTE — Patient Instructions (Signed)
Please continue to check your BP at home, keeping less than 140 Continue taking aspirin 81 mg daily Continue your walking  See you back in 1 year

## 2021-02-15 DIAGNOSIS — Z79899 Other long term (current) drug therapy: Secondary | ICD-10-CM | POA: Diagnosis not present

## 2021-02-15 DIAGNOSIS — R7301 Impaired fasting glucose: Secondary | ICD-10-CM | POA: Diagnosis not present

## 2021-02-15 DIAGNOSIS — M859 Disorder of bone density and structure, unspecified: Secondary | ICD-10-CM | POA: Diagnosis not present

## 2021-02-15 DIAGNOSIS — E785 Hyperlipidemia, unspecified: Secondary | ICD-10-CM | POA: Diagnosis not present

## 2021-02-21 DIAGNOSIS — D2262 Melanocytic nevi of left upper limb, including shoulder: Secondary | ICD-10-CM | POA: Diagnosis not present

## 2021-02-21 DIAGNOSIS — L603 Nail dystrophy: Secondary | ICD-10-CM | POA: Diagnosis not present

## 2021-02-21 DIAGNOSIS — L218 Other seborrheic dermatitis: Secondary | ICD-10-CM | POA: Diagnosis not present

## 2021-02-21 DIAGNOSIS — L821 Other seborrheic keratosis: Secondary | ICD-10-CM | POA: Diagnosis not present

## 2021-02-21 DIAGNOSIS — D1801 Hemangioma of skin and subcutaneous tissue: Secondary | ICD-10-CM | POA: Diagnosis not present

## 2021-02-22 DIAGNOSIS — Z0000 Encounter for general adult medical examination without abnormal findings: Secondary | ICD-10-CM | POA: Diagnosis not present

## 2021-02-22 DIAGNOSIS — E785 Hyperlipidemia, unspecified: Secondary | ICD-10-CM | POA: Diagnosis not present

## 2021-02-22 DIAGNOSIS — I679 Cerebrovascular disease, unspecified: Secondary | ICD-10-CM | POA: Diagnosis not present

## 2021-02-22 DIAGNOSIS — R7309 Other abnormal glucose: Secondary | ICD-10-CM | POA: Diagnosis not present

## 2021-02-22 DIAGNOSIS — Z6825 Body mass index (BMI) 25.0-25.9, adult: Secondary | ICD-10-CM | POA: Diagnosis not present

## 2021-09-13 DIAGNOSIS — H25813 Combined forms of age-related cataract, bilateral: Secondary | ICD-10-CM | POA: Diagnosis not present

## 2021-10-31 DIAGNOSIS — H2513 Age-related nuclear cataract, bilateral: Secondary | ICD-10-CM | POA: Diagnosis not present

## 2021-10-31 DIAGNOSIS — H04123 Dry eye syndrome of bilateral lacrimal glands: Secondary | ICD-10-CM | POA: Diagnosis not present

## 2021-10-31 DIAGNOSIS — H18513 Endothelial corneal dystrophy, bilateral: Secondary | ICD-10-CM | POA: Diagnosis not present

## 2021-12-04 DIAGNOSIS — H2512 Age-related nuclear cataract, left eye: Secondary | ICD-10-CM | POA: Diagnosis not present

## 2021-12-06 ENCOUNTER — Encounter (HOSPITAL_COMMUNITY): Payer: Self-pay

## 2021-12-06 ENCOUNTER — Other Ambulatory Visit: Payer: Self-pay

## 2021-12-06 ENCOUNTER — Encounter (HOSPITAL_COMMUNITY)
Admission: RE | Admit: 2021-12-06 | Discharge: 2021-12-06 | Disposition: A | Discharge status: Home / Self Care | Payer: BC Managed Care – PPO | Source: Ambulatory Visit | Attending: Optometry | Admitting: Optometry

## 2021-12-06 NOTE — H&P (Signed)
Surgical History & Physical  Patient Name: Emma Henderson  DOB: [birth year]  Surgery: Cataract extraction with intraocular lens implant phacoemulsification; Left Eye Surgeon: Ronn Melena MD Surgery Date: 12/11/2021 Pre-Op Date: 12/06/2021  HPI: A 43 Yr. old female patient The patient was referred here from Dr. Jorja Loa for a cataract evaluation f both eyes. The patient's vision is blurry. The complaint is associated with difficulty reading small print on medicine bottles, difficulty recognizing people at a distance, and difficulty driving at night due to halos/glare/rings. This is negatively affecting the patient's quality of life and the patient is unable to function adequately in life with the current level of vision. HPI was performed by Ronn Melena.  Medical History: Dry Eyes Cataracts  Review of Systems Negative Allergic/Immunologic Negative Cardiovascular Negative Constitutional Negative Ear, Nose, Mouth & Throat Negative Endocrine Negative Eyes Negative Gastrointestinal Negative Genitourinary Negative Hemotologic/Lymphatic Negative Integumentary Negative Musculoskeletal Negative Neurological Negative Psychiatry Negative Respiratory  Social Never smoked   Medication Systane Complete,  Aspirin  Sx/Procedures Back Surgery, Partial colectomy  Drug Allergies  Penicillin   History & Physical: Heent: cataracts, fuchs dystrophy OU  NECK: supple without bruits LUNGS: lungs clear to auscultation CV: regular rate and rhythm Abdomen: soft and non-tender  Impression & Plan: Assessment: 1.  Hyperopia ; Both Eyes (H52.03) 2.  ASTIGMATISM, REGULAR; Both Eyes (H52.223) 3.  FUCHS DYSTROPHY / Endothelial corneal dystrophy ; Both Eyes (H18.513) 4.  CATARACT AGE-RELATED NUCLEAR; Both Eyes (H25.13) 5.  Dry Eye Syndrome; Both Eyes (H04.123)  Plan: 1.  Wears glasses full time  2.  Wears glasses full time, discussed option of toric lenses for regular ATR  astigmatism  3.  Few guttae OU, not likely visually significant but wouldn't recommend multifocals  4.  Cataracts are visually significant and account for the patient's complaints. Discussed all risks, benefits, procedures and recovery, including infection, loss of vision and eye, need for glasses after surgery or additional procedures. Patient understands changing glasses will not improve vision. Patient indicated understanding of procedure. All questions answered. Patient desires to have surgery, recommend phacoemulsification with intraocular lens. Patient to have preliminary testing necessary (Argos/IOL Master, Mac OCT, TOPO) Educational materials provided.  Plan: - Target -0.25 OU, proceed with OS followed by OD - if decides toric, lets repeat IOL master and topo given discrepancy between them on first test OS - Lens: considering toric vs monofocal. Long discussion today and will discuss further with Robin before surgery - no prior eye surgeries, good dilation, ok to lie flat - Omidria and combo drop or Pred/moxi  5.  Has been using drops as recommended by Dr. Jorja Loa and looks improved today. Recommend artificial tears, as needed. Discussed possibility of worsening after surgery.

## 2021-12-11 ENCOUNTER — Encounter (HOSPITAL_COMMUNITY)
Admission: RE | Disposition: A | Discharge status: Home / Self Care | Payer: Self-pay | Source: Ambulatory Visit | Attending: Optometry

## 2021-12-11 ENCOUNTER — Ambulatory Visit (HOSPITAL_COMMUNITY)
Admission: RE | Admit: 2021-12-11 | Discharge: 2021-12-11 | Disposition: A | Discharge status: Home / Self Care | Payer: Medicare HMO | Source: Ambulatory Visit | Attending: Optometry | Admitting: Optometry

## 2021-12-11 ENCOUNTER — Ambulatory Visit (HOSPITAL_BASED_OUTPATIENT_CLINIC_OR_DEPARTMENT_OTHER): Payer: Medicare HMO | Admitting: Certified Registered Nurse Anesthetist

## 2021-12-11 ENCOUNTER — Ambulatory Visit (HOSPITAL_COMMUNITY): Payer: Medicare HMO | Admitting: Certified Registered Nurse Anesthetist

## 2021-12-11 ENCOUNTER — Encounter (HOSPITAL_COMMUNITY): Payer: Self-pay | Admitting: Optometry

## 2021-12-11 DIAGNOSIS — H25812 Combined forms of age-related cataract, left eye: Secondary | ICD-10-CM | POA: Diagnosis not present

## 2021-12-11 DIAGNOSIS — Z87891 Personal history of nicotine dependence: Secondary | ICD-10-CM | POA: Diagnosis not present

## 2021-12-11 DIAGNOSIS — H2513 Age-related nuclear cataract, bilateral: Secondary | ICD-10-CM | POA: Insufficient documentation

## 2021-12-11 DIAGNOSIS — H18513 Endothelial corneal dystrophy, bilateral: Secondary | ICD-10-CM | POA: Insufficient documentation

## 2021-12-11 DIAGNOSIS — H5203 Hypermetropia, bilateral: Secondary | ICD-10-CM | POA: Diagnosis not present

## 2021-12-11 DIAGNOSIS — H52223 Regular astigmatism, bilateral: Secondary | ICD-10-CM | POA: Diagnosis not present

## 2021-12-11 DIAGNOSIS — H2512 Age-related nuclear cataract, left eye: Secondary | ICD-10-CM | POA: Diagnosis not present

## 2021-12-11 DIAGNOSIS — H04123 Dry eye syndrome of bilateral lacrimal glands: Secondary | ICD-10-CM | POA: Diagnosis not present

## 2021-12-11 HISTORY — PX: CATARACT EXTRACTION W/PHACO: SHX586

## 2021-12-11 SURGERY — PHACOEMULSIFICATION, CATARACT, WITH IOL INSERTION
Anesthesia: Monitor Anesthesia Care | Site: Eye | Laterality: Left

## 2021-12-11 MED ORDER — PHENYLEPHRINE-KETOROLAC 1-0.3 % IO SOLN
INTRAOCULAR | Status: AC
  Filled 2021-12-11: qty 4

## 2021-12-11 MED ORDER — PHENYLEPHRINE HCL 2.5 % OP SOLN
1.0000 [drp] | OPHTHALMIC | Status: AC | PRN
  Administered 2021-12-11 (×3): 1 [drp] via OPHTHALMIC

## 2021-12-11 MED ORDER — LIDOCAINE HCL 3.5 % OP GEL
1.0000 | Freq: Once | OPHTHALMIC | Status: AC
  Administered 2021-12-11: 1 via OPHTHALMIC

## 2021-12-11 MED ORDER — POVIDONE-IODINE 5 % OP SOLN
OPHTHALMIC | Status: DC | PRN
  Administered 2021-12-11: 1 via OPHTHALMIC

## 2021-12-11 MED ORDER — TETRACAINE HCL 0.5 % OP SOLN
1.0000 [drp] | OPHTHALMIC | Status: AC | PRN
  Administered 2021-12-11 (×3): 1 [drp] via OPHTHALMIC

## 2021-12-11 MED ORDER — MOXIFLOXACIN HCL 0.5 % OP SOLN
OPHTHALMIC | Status: DC | PRN
  Administered 2021-12-11: .2 mL via OPHTHALMIC

## 2021-12-11 MED ORDER — TROPICAMIDE 1 % OP SOLN
1.0000 [drp] | OPHTHALMIC | Status: AC | PRN
  Administered 2021-12-11 (×3): 1 [drp] via OPHTHALMIC

## 2021-12-11 MED ORDER — PHENYLEPHRINE-KETOROLAC 1-0.3 % IO SOLN
INTRAOCULAR | Status: DC | PRN
  Administered 2021-12-11: 500 mL via OPHTHALMIC

## 2021-12-11 MED ORDER — BSS IO SOLN
INTRAOCULAR | Status: DC | PRN
  Administered 2021-12-11: 15 mL via INTRAOCULAR

## 2021-12-11 MED ORDER — LIDOCAINE HCL (PF) 1 % IJ SOLN
INTRAMUSCULAR | Status: DC | PRN
  Administered 2021-12-11: 2 mL

## 2021-12-11 MED ORDER — MOXIFLOXACIN HCL 5 MG/ML IO SOLN
INTRAOCULAR | Status: AC
  Filled 2021-12-11: qty 1

## 2021-12-11 MED ORDER — STERILE WATER FOR IRRIGATION IR SOLN
Status: DC | PRN
  Administered 2021-12-11: 25 mL

## 2021-12-11 MED ORDER — SIGHTPATH DOSE#1 NA HYALUR & NA CHOND-NA HYALUR IO KIT
PACK | INTRAOCULAR | Status: DC | PRN
  Administered 2021-12-11: 1 via OPHTHALMIC

## 2021-12-11 MED ORDER — MIDAZOLAM HCL 5 MG/5ML IJ SOLN
INTRAMUSCULAR | Status: DC | PRN
  Administered 2021-12-11: 1 mg via INTRAVENOUS

## 2021-12-11 MED ORDER — MIDAZOLAM HCL 2 MG/2ML IJ SOLN
INTRAMUSCULAR | Status: AC
  Filled 2021-12-11: qty 2

## 2021-12-11 SURGICAL SUPPLY — 16 items
CATARACT SUITE SIGHTPATH (MISCELLANEOUS) ×1 IMPLANT
CLOTH BEACON ORANGE TIMEOUT ST (SAFETY) ×2 IMPLANT
DRSG TEGADERM 4X4.75 (GAUZE/BANDAGES/DRESSINGS) ×2 IMPLANT
EYE SHIELD UNIVERSAL CLEAR (GAUZE/BANDAGES/DRESSINGS) IMPLANT
FEE CATARACT SUITE SIGHTPATH (MISCELLANEOUS) ×2 IMPLANT
GLOVE BIOGEL PI IND STRL 6.5 (GLOVE) IMPLANT
GLOVE BIOGEL PI IND STRL 7.0 (GLOVE) ×4 IMPLANT
LENS IOL RAYNER 23.0 (Intraocular Lens) ×1 IMPLANT
LENS IOL RAYONE EMV 23.0 (Intraocular Lens) IMPLANT
NDL HYPO 18GX1.5 BLUNT FILL (NEEDLE) ×2 IMPLANT
NEEDLE HYPO 18GX1.5 BLUNT FILL (NEEDLE) ×1 IMPLANT
PAD ARMBOARD 7.5X6 YLW CONV (MISCELLANEOUS) ×2 IMPLANT
SYR TB 1ML LL NO SAFETY (SYRINGE) ×2 IMPLANT
TAPE SURG TRANSPORE 1 IN (GAUZE/BANDAGES/DRESSINGS) IMPLANT
TAPE SURGICAL TRANSPORE 1 IN (GAUZE/BANDAGES/DRESSINGS) ×1
WATER STERILE IRR 250ML POUR (IV SOLUTION) ×2 IMPLANT

## 2021-12-11 NOTE — Op Note (Signed)
Date of procedure: 12/11/21  Pre-operative diagnosis: Visually significant age-related nuclear cataract, Left Eye (H25.12)  Post-operative diagnosis: Visually significant age-related nuclear cataract, Left Eye  Procedure: Removal of cataract via phacoemulsification and insertion of intra-ocular lens Rayner [id number] +23.0D into the capsular bag of the Left Eye  Attending surgeon: Ronn Melena, MD  Anesthesia: MAC, Topical Akten  Complications: None  Estimated Blood Loss: <58m (minimal)  Specimens: None  Implants:  Implant Name Type Inv. Item Serial No. Manufacturer Lot No. LRB No. Used Action  LENS IOL RAYNER 23.0 - S22 Intraocular Lens LENS IOL RAYNER 23.0 22 SIGHT[employer] [id number] Implanted    Indications:  Visually significant age-related cataract, Left Eye  Procedure:  The patient was seen and identified in the pre-operative area. The operative eye was identified and dilated.  The operative eye was marked.  Topical anesthesia was administered to the operative eye.     The patient was then to the operative suite and placed in the supine position.  A timeout was performed confirming the patient, procedure to be performed, and all other relevant information.   The patient's face was prepped and draped in the usual fashion for intra-ocular surgery.  A lid speculum was placed into the operative eye and the surgical microscope moved into place and focused.  An inferotemporal paracentesis was created using a 20 gauge paracentesis blade.  BSS mixed with Omidria, followed by 1% lidocaine was injected into the anterior chamber.  Viscoelastic was injected into the anterior chamber.  A temporal clear-corneal main wound incision was created using a 2.412mmicrokeratome.  A continuous curvilinear capsulorrhexis was initiated using an irrigating cystitome and completed using capsulorrhexis forceps.  Hydrodissection and hydrodeliniation were performed.  Viscoelastic was injected into the  anterior chamber.  A phacoemulsification handpiece and a chopper as a second instrument were used to remove the nucleus and epinucleus. The irrigation/aspiration handpiece was used to remove any remaining cortical material.   The capsular bag was reinflated with viscoelastic, checked, and found to be intact.  The intraocular lens was inserted into the capsular bag.  The irrigation/aspiration handpiece was used to remove any remaining viscoelastic.  The clear corneal wound and paracentesis wounds were then hydrated and checked with Weck-Cels to be watertight.  The lid-speculum and drape was removed, and the patient's face was cleaned with a wet and dry 4x4.  Moxifloxacin was instilled onto the eye. A clear shield was taped over the eye. The patient was taken to the post-operative care unit in good condition, having tolerated the procedure well.  Post-Op Instructions: [facility] and will receive all other orders and instructions.

## 2021-12-11 NOTE — Discharge Instructions (Signed)
Please discharge patient when stable, will follow up today with Dr. Shakai Dolley at the [facility] office immediately following discharge.  Leave shield in place until visit.  All paperwork with discharge instructions will be given at the office.  [facility] Address:  [address]  [place], [place] [address]  Dr. Satoya Feeley's Phone: [phone]  

## 2021-12-11 NOTE — Anesthesia Preprocedure Evaluation (Signed)
Anesthesia Evaluation  Patient identified by MRN, date of birth, ID band Patient awake    Reviewed: Allergy & Precautions, H&P , NPO status , Patient's Chart, lab work & pertinent test results, reviewed documented beta blocker date and time   Airway Mallampati: II  TM Distance: >3 FB Neck ROM: full    Dental no notable dental hx.    Pulmonary neg pulmonary ROS, former smoker   Pulmonary exam normal breath sounds clear to auscultation       Cardiovascular Exercise Tolerance: Good negative cardio ROS  Rhythm:regular Rate:Normal     Neuro/Psych  Headaches  negative psych ROS   GI/Hepatic negative GI ROS, Neg liver ROS,,,  Endo/Other  negative endocrine ROS    Renal/GU negative Renal ROS  negative genitourinary   Musculoskeletal   Abdominal   Peds  Hematology negative hematology ROS (+)   Anesthesia Other Findings   Reproductive/Obstetrics negative OB ROS                             Anesthesia Physical Anesthesia Plan  ASA: 2  Anesthesia Plan: MAC   Post-op Pain Management:    Induction:   PONV Risk Score and Plan:   Airway Management Planned:   Additional Equipment:   Intra-op Plan:   Post-operative Plan:   Informed Consent: I have reviewed the patients History and Physical, chart, labs and discussed the procedure including the risks, benefits and alternatives for the proposed anesthesia with the patient or authorized representative who has indicated his/her understanding and acceptance.     Dental Advisory Given  Plan Discussed with: CRNA  Anesthesia Plan Comments:        Anesthesia Quick Evaluation

## 2021-12-11 NOTE — Interval H&P Note (Signed)
History and Physical Interval Note:  12/11/2021 11:14 AM  The H and P was reviewed and updated. The patient was examined.  No changes were found after exam.  The surgical eye was marked.   Emma Henderson

## 2021-12-11 NOTE — Transfer of Care (Signed)
Immediate Anesthesia Transfer of Care Note  Patient: Emma Henderson  Procedure(s) Performed: CATARACT EXTRACTION PHACO AND INTRAOCULAR LENS PLACEMENT (IOC) (Left: Eye)  Patient Location: PACU and Short Stay  Anesthesia Type:MAC  Level of Consciousness: awake, alert , and oriented  Airway & Oxygen Therapy: Patient Spontanous Breathing  Post-op Assessment: Report given to RN and Post -op Vital signs reviewed and stable  Post vital signs: Reviewed and stable  Last Vitals:  Vitals Value Taken Time  BP 139/83 12/11/21 1218  Temp 36.8 C 12/11/21 1218  Pulse 73 12/11/21 1218  Resp 20 12/11/21 1218  SpO2 98 % 12/11/21 1218    Last Pain:  Vitals:   12/11/21 1218  TempSrc: Oral  PainSc: 0-No pain         Complications: No notable events documented.

## 2021-12-12 ENCOUNTER — Encounter (HOSPITAL_COMMUNITY): Payer: Self-pay | Admitting: Optometry

## 2021-12-12 NOTE — Anesthesia Postprocedure Evaluation (Signed)
Anesthesia Post Note  Patient: Emma Henderson  Procedure(s) Performed: CATARACT EXTRACTION PHACO AND INTRAOCULAR LENS PLACEMENT (IOC) (Left: Eye)  Patient location during evaluation: Phase II Anesthesia Type: MAC Level of consciousness: awake Pain management: pain level controlled Vital Signs Assessment: post-procedure vital signs reviewed and stable Respiratory status: spontaneous breathing and respiratory function stable Cardiovascular status: blood pressure returned to baseline and stable Postop Assessment: no headache and no apparent nausea or vomiting Anesthetic complications: no Comments: Late entry   No notable events documented.   Last Vitals:  Vitals:   12/11/21 1036 12/11/21 1218  BP: (!) 180/87 139/83  Pulse: 76 73  Resp: 18 20  Temp: 36.8 C 36.8 C  SpO2: 99% 98%    Last Pain:  Vitals:   12/11/21 1218  TempSrc: Oral  PainSc: 0-No pain                 Louann Sjogren

## 2021-12-17 NOTE — H&P (Signed)
Surgical History & Physical  Patient Name: Emma Henderson  DOB: 10/31/1945  Surgery: Cataract extraction with intraocular lens implant phacoemulsification; Right Eye Surgeon: Ronn Melena MD Surgery Date: 12/28/2021 Pre-Op Date: 10/31/2021  HPI: A 62 Yr. old female patient The patient was referred here from Dr. Jorja Loa for a cataract evaluation f both eyes. The patient's vision is blurry. The complaint is associated with difficulty reading small print on medicine bottles, difficulty recognizing people at a distance, and difficulty driving at night due to halos/glare/rings. This is negatively affecting the patient's quality of life and the patient is unable to function adequately in life with the current level of vision. HPI was performed by Ronn Melena.  Medical History: Dry Eyes Cataracts   Review of Systems Negative Allergic/Immunologic Negative Cardiovascular Negative Constitutional Negative Ear, Nose, Mouth & Throat Negative Endocrine Negative Eyes Negative Gastrointestinal Negative Genitourinary Negative Hemotologic/Lymphatic Negative Integumentary Negative Musculoskeletal Negative Neurological Negative Psychiatry Negative Respiratory  Social Never smoked  Medication Systane Complete, Prednisolone-Moxifloxacin-Bromfenac,  Aspirin   Sx/Procedures Phaco c IOL OS,  Back Surgery, Partial colectomy  Drug Allergies  Penicillin  History & Physical: Heent: cataract OD, Phaco IOL OS NECK: supple without bruits LUNGS: lungs clear to auscultation CV: regular rate and rhythm Abdomen: soft and non-tender  Impression & Plan: Assessment: 1.  Hyperopia ; Both Eyes (H52.03) 2.  ASTIGMATISM, REGULAR; Both Eyes (H52.223) 3.  FUCHS DYSTROPHY / Endothelial corneal dystrophy ; Both Eyes (H18.513) 4.  CATARACT AGE-RELATED NUCLEAR; Both Eyes (H25.13) 5.  Dry Eye Syndrome; Both Eyes (H04.123)  Plan: 1.  Wears glasses full time  2.  Wears glasses full time, discussed  option of toric lenses for regular ATR astigmatism  3.  Few guttae OU, not likely visually significant but wouldn't recommend multifocals  4.  Cataracts are visually significant and account for the patient's complaints. Discussed all risks, benefits, procedures and recovery, including infection, loss of vision and eye, need for glasses after surgery or additional procedures. Patient understands changing glasses will not improve vision. Patient indicated understanding of procedure. All questions answered. Patient desires to have surgery, recommend phacoemulsification with intraocular lens. Patient to have preliminary testing necessary (Argos/IOL Master, Mac OCT, TOPO) Educational materials provided.  Plan: - Target -0.25 OU, proceed with OD - if decides toric, lets repeat IOL master and topo given discrepancy between them on first test OS - Lens: considering toric vs monofocal. Long discussion today and will discuss further with Robin before surgery - no prior eye surgeries, good dilation, ok to lie flat - Omidria and combo drop or Pred/moxi  5.  Has been using drops as recommended by Dr. Jorja Loa and looks improved today. Recommend artificial tears, as needed. Discussed possibility of worsening after surgery.

## 2021-12-21 DIAGNOSIS — H2511 Age-related nuclear cataract, right eye: Secondary | ICD-10-CM | POA: Diagnosis not present

## 2021-12-24 ENCOUNTER — Encounter (HOSPITAL_COMMUNITY)
Admission: RE | Admit: 2021-12-24 | Discharge: 2021-12-24 | Disposition: A | Discharge status: Home / Self Care | Payer: BC Managed Care – PPO | Source: Ambulatory Visit | Attending: Optometry | Admitting: Optometry

## 2021-12-28 ENCOUNTER — Encounter (HOSPITAL_COMMUNITY): Payer: Self-pay | Admitting: Optometry

## 2021-12-28 ENCOUNTER — Ambulatory Visit (HOSPITAL_COMMUNITY)
Admission: RE | Admit: 2021-12-28 | Discharge: 2021-12-28 | Disposition: A | Discharge status: Home / Self Care | Payer: Medicare HMO | Attending: Optometry | Admitting: Optometry

## 2021-12-28 ENCOUNTER — Other Ambulatory Visit: Payer: Self-pay

## 2021-12-28 ENCOUNTER — Ambulatory Visit (HOSPITAL_COMMUNITY): Payer: Medicare HMO | Admitting: Anesthesiology

## 2021-12-28 ENCOUNTER — Encounter (HOSPITAL_COMMUNITY)
Admission: RE | Disposition: A | Discharge status: Home / Self Care | Payer: Self-pay | Source: Home / Self Care | Attending: Optometry

## 2021-12-28 DIAGNOSIS — H52223 Regular astigmatism, bilateral: Secondary | ICD-10-CM | POA: Diagnosis not present

## 2021-12-28 DIAGNOSIS — R519 Headache, unspecified: Secondary | ICD-10-CM | POA: Insufficient documentation

## 2021-12-28 DIAGNOSIS — Z87891 Personal history of nicotine dependence: Secondary | ICD-10-CM | POA: Insufficient documentation

## 2021-12-28 DIAGNOSIS — H04123 Dry eye syndrome of bilateral lacrimal glands: Secondary | ICD-10-CM | POA: Insufficient documentation

## 2021-12-28 DIAGNOSIS — H18513 Endothelial corneal dystrophy, bilateral: Secondary | ICD-10-CM | POA: Diagnosis not present

## 2021-12-28 DIAGNOSIS — H5203 Hypermetropia, bilateral: Secondary | ICD-10-CM | POA: Insufficient documentation

## 2021-12-28 DIAGNOSIS — H25811 Combined forms of age-related cataract, right eye: Secondary | ICD-10-CM

## 2021-12-28 DIAGNOSIS — H2511 Age-related nuclear cataract, right eye: Secondary | ICD-10-CM | POA: Insufficient documentation

## 2021-12-28 HISTORY — PX: CATARACT EXTRACTION W/PHACO: SHX586

## 2021-12-28 SURGERY — PHACOEMULSIFICATION, CATARACT, WITH IOL INSERTION
Anesthesia: Monitor Anesthesia Care | Site: Eye | Laterality: Right

## 2021-12-28 MED ORDER — BSS IO SOLN
INTRAOCULAR | Status: DC | PRN
  Administered 2021-12-28: 15 mL via INTRAOCULAR

## 2021-12-28 MED ORDER — MOXIFLOXACIN HCL 5 MG/ML IO SOLN
INTRAOCULAR | Status: AC
  Filled 2021-12-28: qty 1

## 2021-12-28 MED ORDER — PHENYLEPHRINE HCL 2.5 % OP SOLN
1.0000 [drp] | OPHTHALMIC | Status: AC
  Administered 2021-12-28 (×3): 1 [drp] via OPHTHALMIC

## 2021-12-28 MED ORDER — TETRACAINE HCL 0.5 % OP SOLN
1.0000 [drp] | OPHTHALMIC | Status: AC
  Administered 2021-12-28 (×3): 1 [drp] via OPHTHALMIC

## 2021-12-28 MED ORDER — POVIDONE-IODINE 5 % OP SOLN
OPHTHALMIC | Status: DC | PRN
  Administered 2021-12-28: 1 via OPHTHALMIC

## 2021-12-28 MED ORDER — PHENYLEPHRINE-KETOROLAC 1-0.3 % IO SOLN
INTRAOCULAR | Status: AC
  Filled 2021-12-28: qty 4

## 2021-12-28 MED ORDER — MIDAZOLAM HCL 2 MG/2ML IJ SOLN
INTRAMUSCULAR | Status: AC
  Filled 2021-12-28: qty 2

## 2021-12-28 MED ORDER — LIDOCAINE HCL 3.5 % OP GEL: 1.0000 | Freq: Once | OPHTHALMIC | Status: DC

## 2021-12-28 MED ORDER — SIGHTPATH DOSE#1 NA HYALUR & NA CHOND-NA HYALUR IO KIT
PACK | INTRAOCULAR | Status: DC | PRN
  Administered 2021-12-28: 1 via OPHTHALMIC

## 2021-12-28 MED ORDER — STERILE WATER FOR IRRIGATION IR SOLN
Status: DC | PRN
  Administered 2021-12-28: 25 mL

## 2021-12-28 MED ORDER — PHENYLEPHRINE-KETOROLAC 1-0.3 % IO SOLN
INTRAOCULAR | Status: DC | PRN
  Administered 2021-12-28: 500 mL via OPHTHALMIC

## 2021-12-28 MED ORDER — MOXIFLOXACIN HCL 0.5 % OP SOLN
OPHTHALMIC | Status: DC | PRN
  Administered 2021-12-28: .3 mL via OPHTHALMIC

## 2021-12-28 MED ORDER — MIDAZOLAM HCL 5 MG/5ML IJ SOLN
INTRAMUSCULAR | Status: DC | PRN
  Administered 2021-12-28: 1 mg via INTRAVENOUS

## 2021-12-28 MED ORDER — TROPICAMIDE 1 % OP SOLN
1.0000 [drp] | OPHTHALMIC | Status: AC
  Administered 2021-12-28 (×3): 1 [drp] via OPHTHALMIC
  Filled 2021-12-28: qty 3

## 2021-12-28 MED ORDER — LIDOCAINE HCL (PF) 1 % IJ SOLN
INTRAMUSCULAR | Status: DC | PRN
  Administered 2021-12-28: 1 mL

## 2021-12-28 SURGICAL SUPPLY — 16 items
CATARACT SUITE SIGHTPATH (MISCELLANEOUS) ×1 IMPLANT
CLOTH BEACON ORANGE TIMEOUT ST (SAFETY) ×2 IMPLANT
DRSG TEGADERM 4X4.75 (GAUZE/BANDAGES/DRESSINGS) ×2 IMPLANT
EYE SHIELD UNIVERSAL CLEAR (GAUZE/BANDAGES/DRESSINGS) IMPLANT
FEE CATARACT SUITE SIGHTPATH (MISCELLANEOUS) ×2 IMPLANT
GLOVE BIOGEL PI IND STRL 7.0 (GLOVE) ×4 IMPLANT
GLOVE SS BIOGEL STRL SZ 6.5 (GLOVE) IMPLANT
LENS IOL RAYNER 23.0 (Intraocular Lens) ×1 IMPLANT
LENS IOL RAYONE EMV 23.0 (Intraocular Lens) IMPLANT
NDL HYPO 18GX1.5 BLUNT FILL (NEEDLE) ×2 IMPLANT
NEEDLE HYPO 18GX1.5 BLUNT FILL (NEEDLE) ×1 IMPLANT
PAD ARMBOARD 7.5X6 YLW CONV (MISCELLANEOUS) ×2 IMPLANT
SYR TB 1ML LL NO SAFETY (SYRINGE) ×2 IMPLANT
TAPE SURG TRANSPORE 1 IN (GAUZE/BANDAGES/DRESSINGS) IMPLANT
TAPE SURGICAL TRANSPORE 1 IN (GAUZE/BANDAGES/DRESSINGS) ×1
WATER STERILE IRR 250ML POUR (IV SOLUTION) ×2 IMPLANT

## 2021-12-28 NOTE — Transfer of Care (Signed)
Immediate Anesthesia Transfer of Care Note  Patient: Emma Henderson  Procedure(s) Performed: CATARACT EXTRACTION PHACO AND INTRAOCULAR LENS PLACEMENT (IOC) (Right: Eye)  Patient Location: PACU  Anesthesia Type:MAC  Level of Consciousness: awake, alert , and oriented  Airway & Oxygen Therapy: Patient Spontanous Breathing  Post-op Assessment: Report given to RN and Post -op Vital signs reviewed and stable  Post vital signs: Reviewed and stable  Last Vitals:  Vitals Value Taken Time  BP    Temp    Pulse    Resp    SpO2      Last Pain:  Vitals:   12/28/21 0615  TempSrc: Oral  PainSc: 0-No pain         Complications: No notable events documented.

## 2021-12-28 NOTE — Op Note (Signed)
Date of procedure: 12/28/21  Pre-operative diagnosis: Visually significant age-related nuclear cataract, Right Eye (H25.11)  Post-operative diagnosis: Visually significant age-related nuclear cataract, Right Eye  Procedure: Removal of cataract via phacoemulsification and insertion of intra-ocular lens Rayner [id number] +23.0D into the capsular bag of the Right Eye  Attending surgeon: Ronn Melena, MD  Anesthesia: MAC, Topical Akten  Complications: None  Estimated Blood Loss: <74m (minimal)  Specimens: None  Implants:  Implant Name Type Inv. Item Serial No. Manufacturer Lot No. LRB No. Used Action  LENS IOL RAYNER 23.0 - S79 Intraocular Lens LENS IOL RAYNER 23.0 79 S[employer] [id number] Implanted    Indications:  Visually significant age-related cataract, Right Eye  Procedure:  The patient was seen and identified in the pre-operative area. The operative eye was identified and dilated.  The operative eye was marked.  Topical anesthesia was administered to the operative eye.     The patient was then to the operative suite and placed in the supine position.  A timeout was performed confirming the patient, procedure to be performed, and all other relevant information.   The patient's face was prepped and draped in the usual fashion for intra-ocular surgery.  A lid speculum was placed into the operative eye and the surgical microscope moved into place and focused.  A superotemporal paracentesis was created using a 20 gauge paracentesis blade.  BSS mixed with Omidria, followed by 1% lidocaine was injected into the anterior chamber.  Viscoelastic was injected into the anterior chamber.  A temporal clear-corneal main wound incision was created using a 2.419mmicrokeratome.  A continuous curvilinear capsulorrhexis was initiated using an irrigating cystitome and completed using capsulorrhexis forceps.  Hydrodissection and hydrodeliniation were performed.  Viscoelastic was injected into the  anterior chamber.  A phacoemulsification handpiece and a chopper as a second instrument were used to remove the nucleus and epinucleus. The irrigation/aspiration handpiece was used to remove any remaining cortical material.   The capsular bag was reinflated with viscoelastic, checked, and found to be intact.  The intraocular lens was inserted into the capsular bag.  The irrigation/aspiration handpiece was used to remove any remaining viscoelastic.  The clear corneal wound and paracentesis wounds were then hydrated and checked with Weck-Cels to be watertight.  The lid-speculum and drape was removed, and the patient's face was cleaned with a wet and dry 4x4.  Moxifloxacin drops were instilled onto the eye. A clear shield was taped over the eye. The patient was taken to the post-operative care unit in good condition, having tolerated the procedure well.  Post-Op Instructions: [facility] and will receive all other orders and instructions.

## 2021-12-28 NOTE — Discharge Instructions (Signed)
Please discharge patient when stable, will follow up today with Dr. Aarilyn Dye at the [facility] [place] office immediately following discharge.  Leave shield in place until visit.  All paperwork with discharge instructions will be given at the office.  [facility] [place] Address:  [address]  [place], [place] [address]  Dr. Jaylee Lantry's Phone: [phone]  

## 2021-12-28 NOTE — Anesthesia Postprocedure Evaluation (Signed)
Anesthesia Post Note  Patient: Emma Henderson  Procedure(s) Performed: CATARACT EXTRACTION PHACO AND INTRAOCULAR LENS PLACEMENT (IOC) (Right: Eye)  Patient location during evaluation: Phase II Anesthesia Type: MAC Level of consciousness: awake and alert and oriented Pain management: pain level controlled Vital Signs Assessment: post-procedure vital signs reviewed and stable Respiratory status: spontaneous breathing, nonlabored ventilation and respiratory function stable Cardiovascular status: blood pressure returned to baseline and stable Postop Assessment: no apparent nausea or vomiting Anesthetic complications: no  No notable events documented.   Last Vitals:  Vitals:   12/28/21 0615 12/28/21 0800  BP: 134/87 (!) 159/96  Pulse: 73 81  Resp: 16 15  Temp: 36.8 C 36.7 C  SpO2: 98% 98%    Last Pain:  Vitals:   12/28/21 0800  TempSrc: Oral  PainSc: 0-No pain                 Tiani Stanbery C Hasset Chaviano

## 2021-12-28 NOTE — Interval H&P Note (Signed)
History and Physical Interval Note:  12/28/2021 7:16 AM  The H and P was reviewed and updated. The patient was examined.  No changes were found after exam.  The surgical eye was marked.    Emma Henderson

## 2021-12-28 NOTE — Anesthesia Preprocedure Evaluation (Signed)
Anesthesia Evaluation  Patient identified by MRN, date of birth, ID band Patient awake    Reviewed: Allergy & Precautions, H&P , NPO status , Patient's Chart, lab work & pertinent test results  Airway Mallampati: II  TM Distance: >3 FB Neck ROM: Full    Dental no notable dental hx. (+) Teeth Intact, Dental Advisory Given   Pulmonary neg pulmonary ROS, former smoker   Pulmonary exam normal breath sounds clear to auscultation       Cardiovascular negative cardio ROS Normal cardiovascular exam Rhythm:Regular Rate:Normal     Neuro/Psych  Headaches  negative psych ROS   GI/Hepatic negative GI ROS, Neg liver ROS,,,  Endo/Other  negative endocrine ROS    Renal/GU negative Renal ROS  negative genitourinary   Musculoskeletal negative musculoskeletal ROS (+)    Abdominal   Peds negative pediatric ROS (+)  Hematology negative hematology ROS (+)   Anesthesia Other Findings   Reproductive/Obstetrics negative OB ROS                             Anesthesia Physical Anesthesia Plan  ASA: 2  Anesthesia Plan: General and MAC   Post-op Pain Management:    Induction: Intravenous  PONV Risk Score and Plan: Treatment may vary due to age or medical condition  Airway Management Planned: Nasal Cannula and Natural Airway  Additional Equipment:   Intra-op Plan:   Post-operative Plan:   Informed Consent: I have reviewed the patients History and Physical, chart, labs and discussed the procedure including the risks, benefits and alternatives for the proposed anesthesia with the patient or authorized representative who has indicated his/her understanding and acceptance.     Dental advisory given  Plan Discussed with: CRNA and Surgeon  Anesthesia Plan Comments:        Anesthesia Quick Evaluation

## 2021-12-31 ENCOUNTER — Encounter (HOSPITAL_COMMUNITY): Payer: Self-pay | Admitting: Optometry

## 2022-01-10 ENCOUNTER — Ambulatory Visit: Payer: Medicare HMO | Admitting: Neurology

## 2022-02-05 ENCOUNTER — Other Ambulatory Visit (HOSPITAL_COMMUNITY): Payer: Self-pay | Admitting: Internal Medicine

## 2022-02-05 DIAGNOSIS — Z1231 Encounter for screening mammogram for malignant neoplasm of breast: Secondary | ICD-10-CM

## 2022-02-11 ENCOUNTER — Ambulatory Visit (HOSPITAL_COMMUNITY): Payer: BC Managed Care – PPO

## 2022-02-11 ENCOUNTER — Ambulatory Visit (HOSPITAL_COMMUNITY)
Admission: RE | Admit: 2022-02-11 | Discharge: 2022-02-11 | Disposition: A | Discharge status: Home / Self Care | Payer: Medicare HMO | Source: Ambulatory Visit | Attending: Internal Medicine | Admitting: Internal Medicine

## 2022-02-11 DIAGNOSIS — Z1231 Encounter for screening mammogram for malignant neoplasm of breast: Secondary | ICD-10-CM | POA: Diagnosis not present

## 2022-02-19 DIAGNOSIS — I679 Cerebrovascular disease, unspecified: Secondary | ICD-10-CM | POA: Diagnosis not present

## 2022-02-19 DIAGNOSIS — E785 Hyperlipidemia, unspecified: Secondary | ICD-10-CM | POA: Diagnosis not present

## 2022-02-19 DIAGNOSIS — R7301 Impaired fasting glucose: Secondary | ICD-10-CM | POA: Diagnosis not present

## 2022-02-19 DIAGNOSIS — M5186 Other intervertebral disc disorders, lumbar region: Secondary | ICD-10-CM | POA: Diagnosis not present

## 2022-02-19 DIAGNOSIS — Z79899 Other long term (current) drug therapy: Secondary | ICD-10-CM | POA: Diagnosis not present

## 2022-04-09 DIAGNOSIS — L821 Other seborrheic keratosis: Secondary | ICD-10-CM | POA: Diagnosis not present

## 2022-04-09 DIAGNOSIS — L720 Epidermal cyst: Secondary | ICD-10-CM | POA: Diagnosis not present

## 2022-04-09 DIAGNOSIS — D1801 Hemangioma of skin and subcutaneous tissue: Secondary | ICD-10-CM | POA: Diagnosis not present

## 2022-04-30 DIAGNOSIS — Z79899 Other long term (current) drug therapy: Secondary | ICD-10-CM | POA: Diagnosis not present

## 2022-04-30 DIAGNOSIS — E785 Hyperlipidemia, unspecified: Secondary | ICD-10-CM | POA: Diagnosis not present

## 2022-05-21 DIAGNOSIS — I679 Cerebrovascular disease, unspecified: Secondary | ICD-10-CM | POA: Diagnosis not present

## 2022-05-21 DIAGNOSIS — E785 Hyperlipidemia, unspecified: Secondary | ICD-10-CM | POA: Diagnosis not present

## 2023-01-13 IMAGING — DX DG THORACIC SPINE 2V
3 series · 3 of 3 positions shown · non-contrast
Comparison: None.

CLINICAL DATA: Upper back pain without known injury.

EXAM:
THORACIC SPINE 2 VIEWS

[t-spine ap]
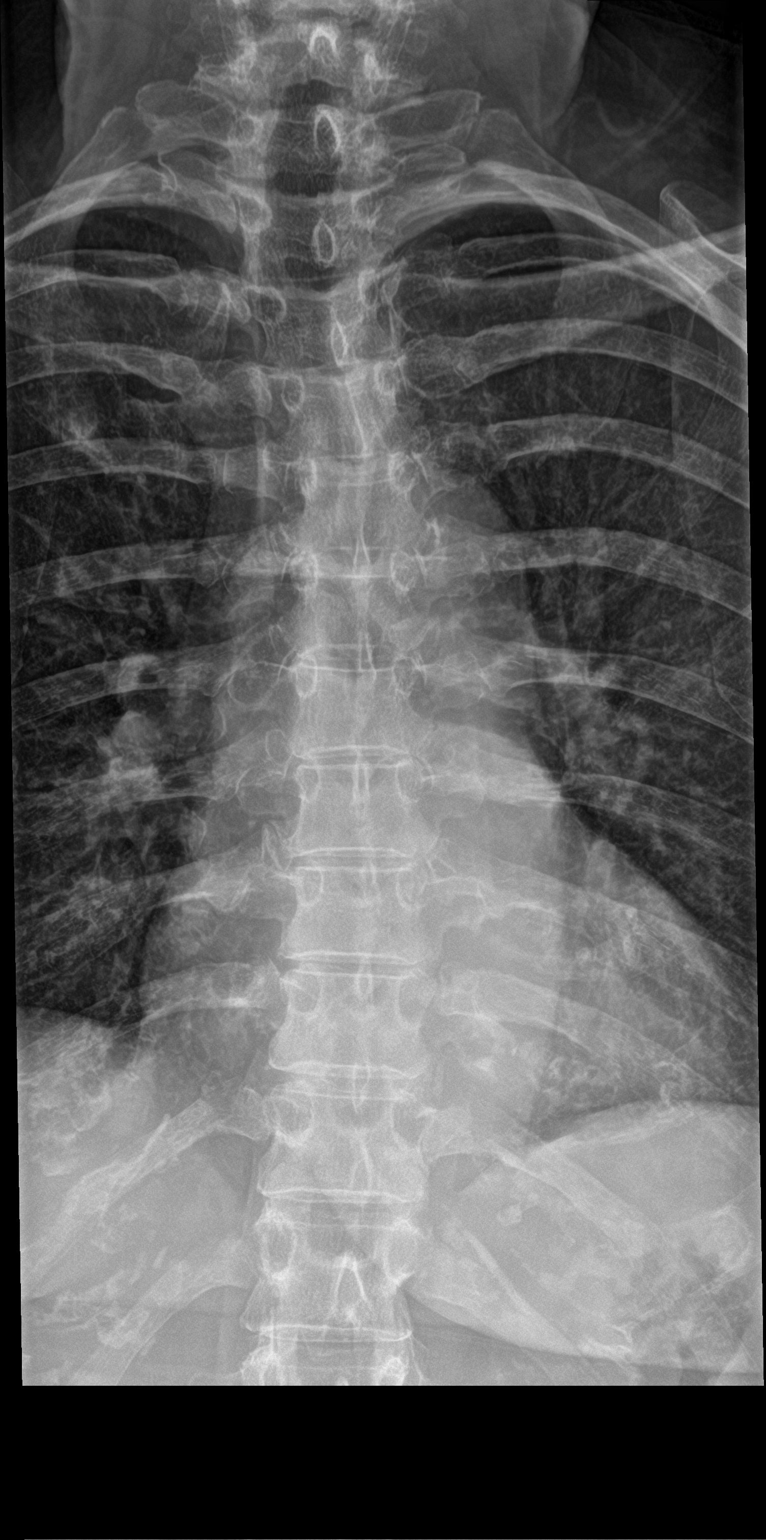

[t-spine lat]
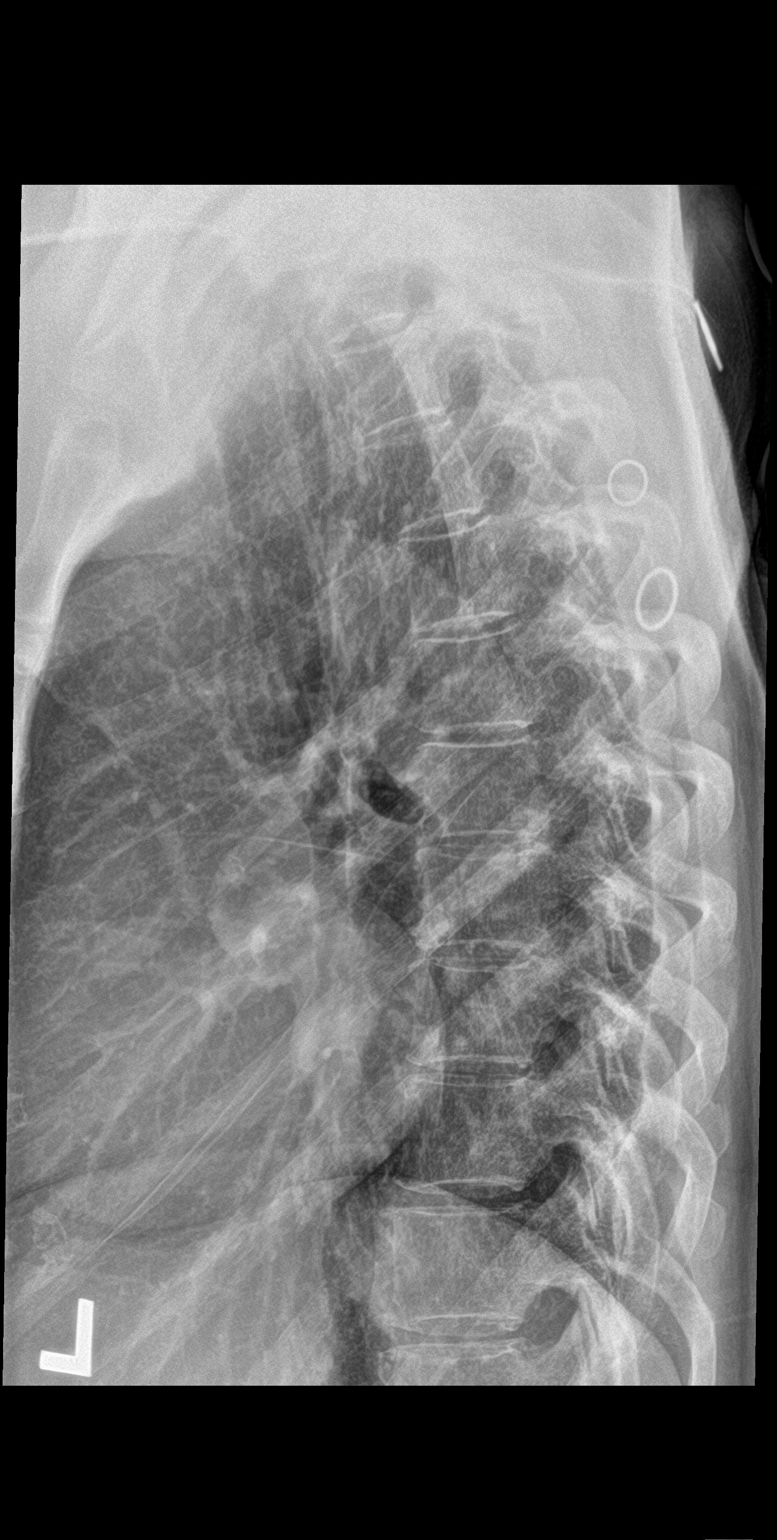

[t-spine swimmers]
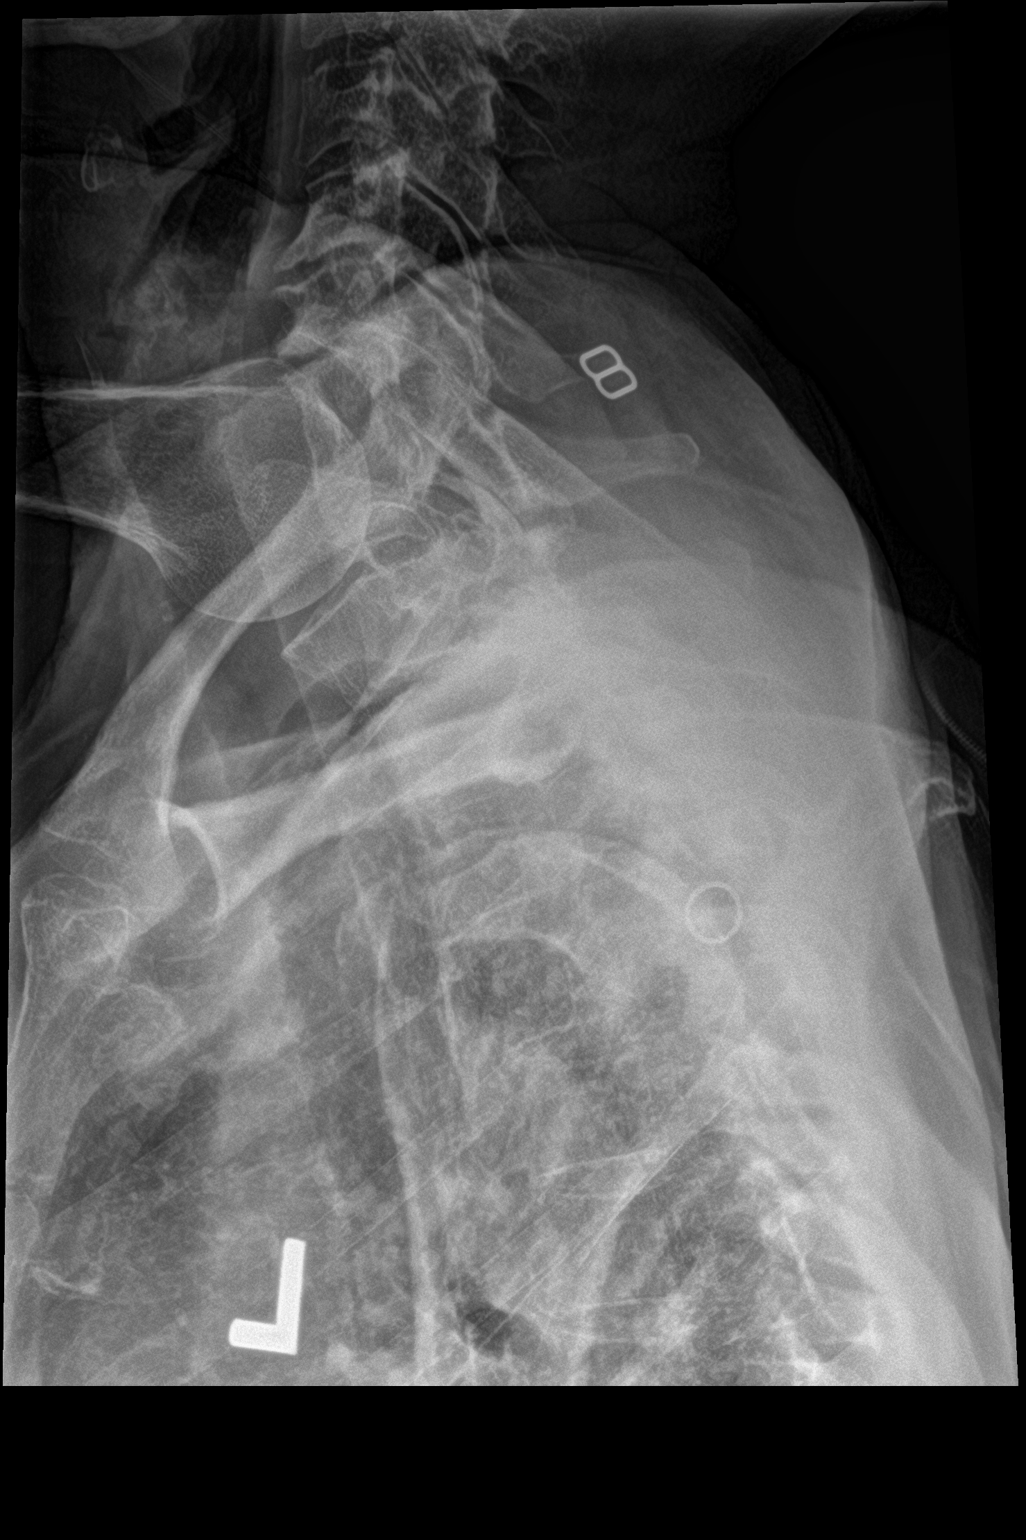

[3 of 3 positions shown; findings below may reference images not displayed]

FINDINGS: There is no evidence of thoracic spine fracture. Alignment is
normal. No other significant bone abnormalities are identified.
IMPRESSION: Negative.

## 2023-01-13 IMAGING — DX DG LUMBAR SPINE 2-3V
3 series · 3 of 3 positions shown · non-contrast
Comparison: None.

CLINICAL DATA: Lower back pain without known injury.

EXAM:
LUMBAR SPINE - 2-3 VIEW

[l-spine ap]
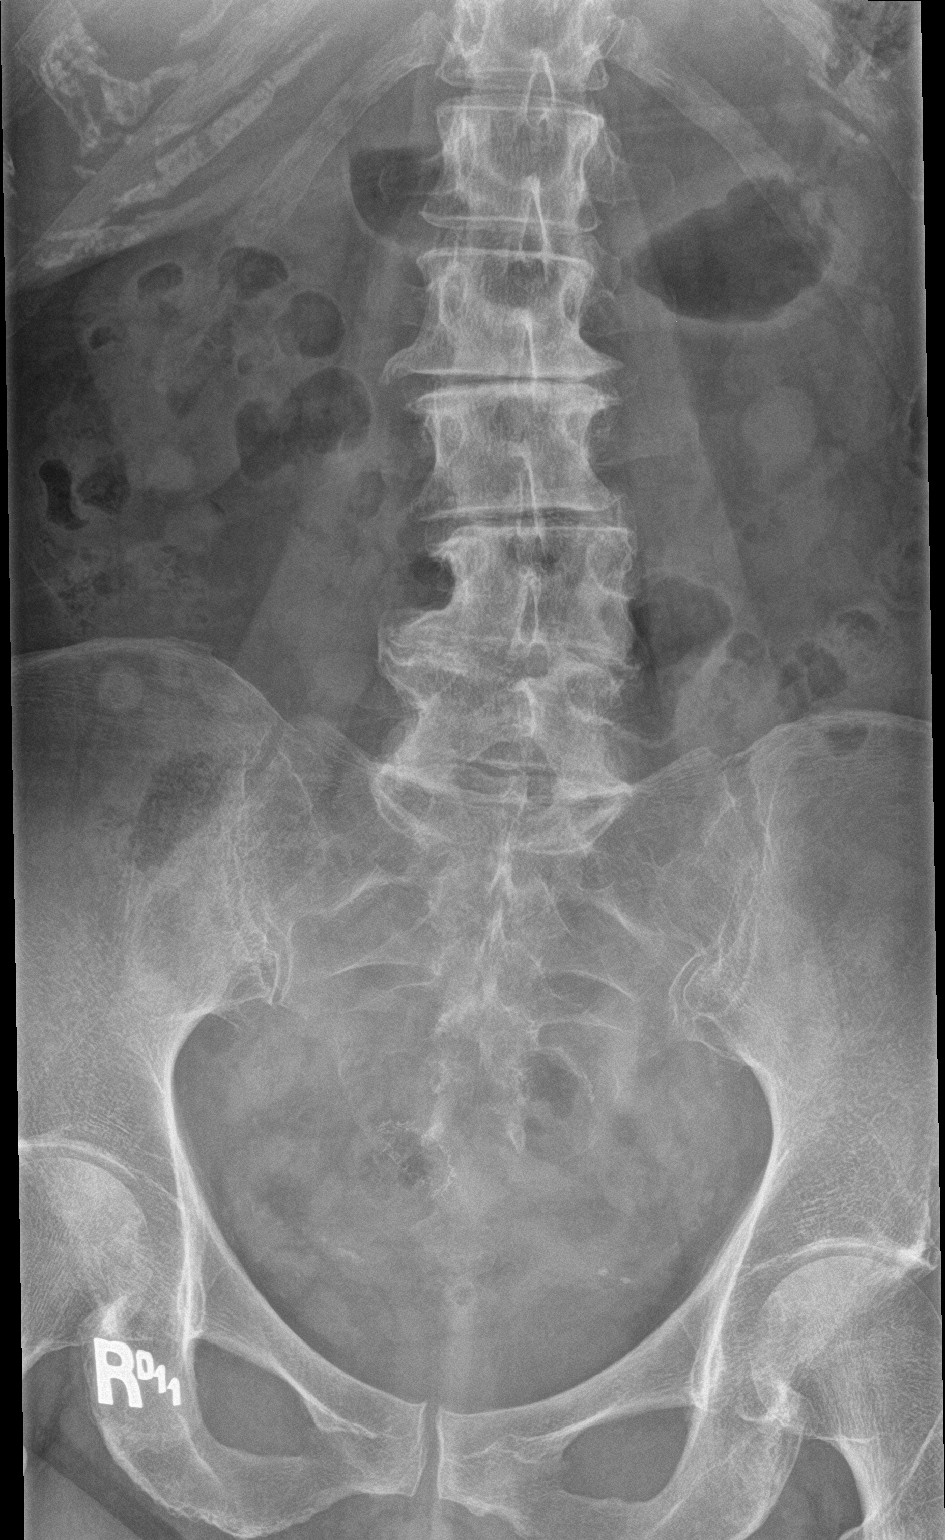

[l-spine lat]
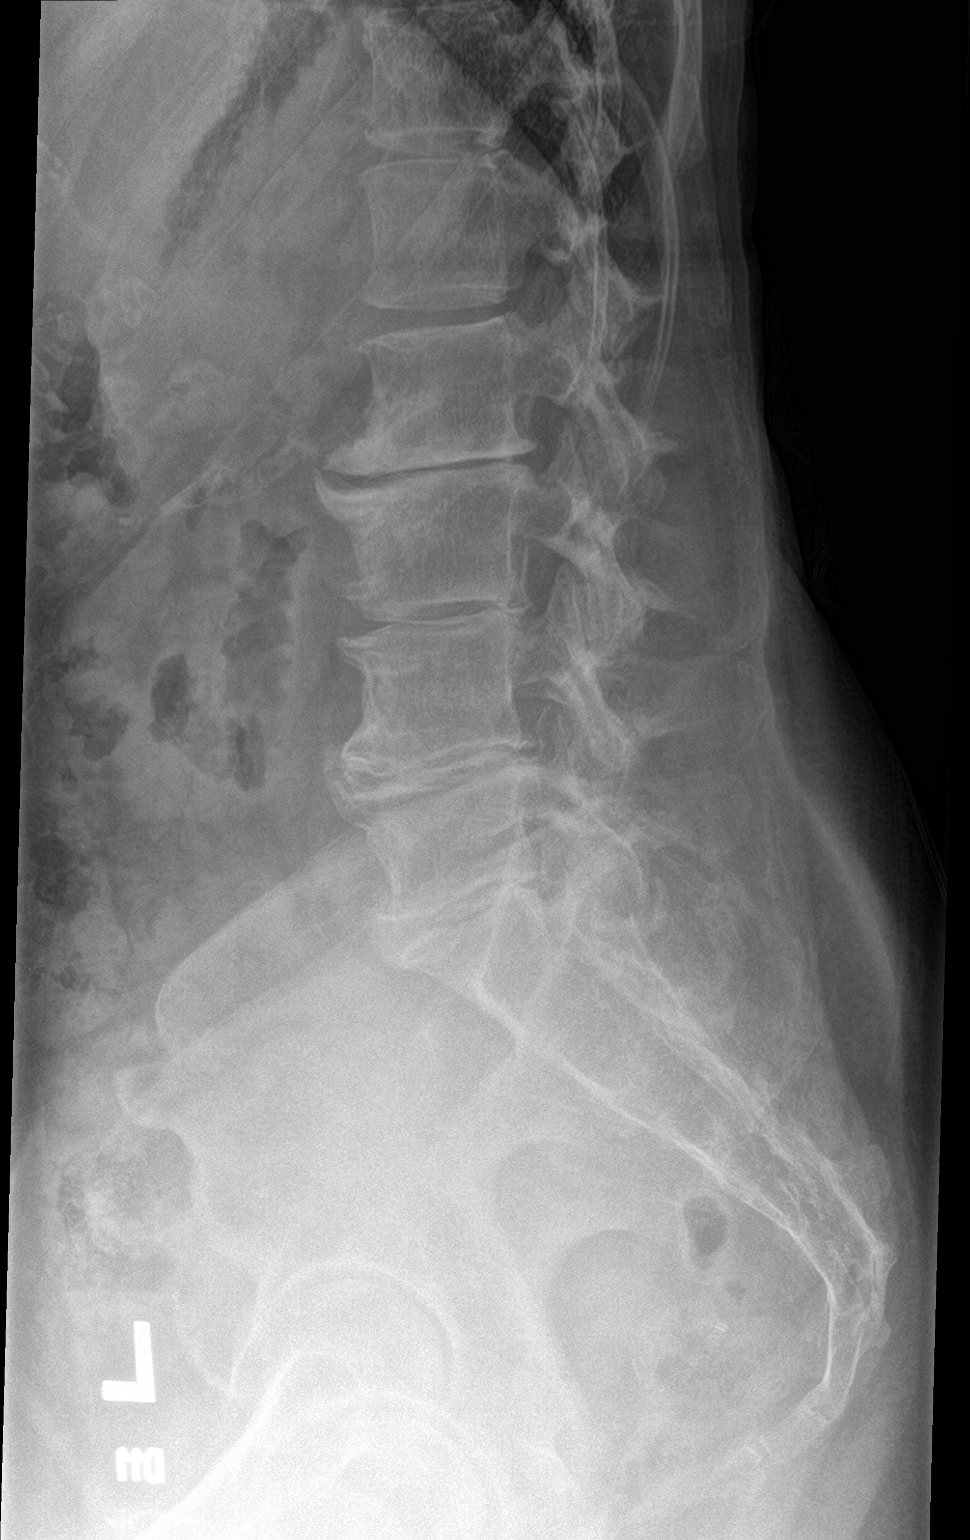

[l-spine spot]
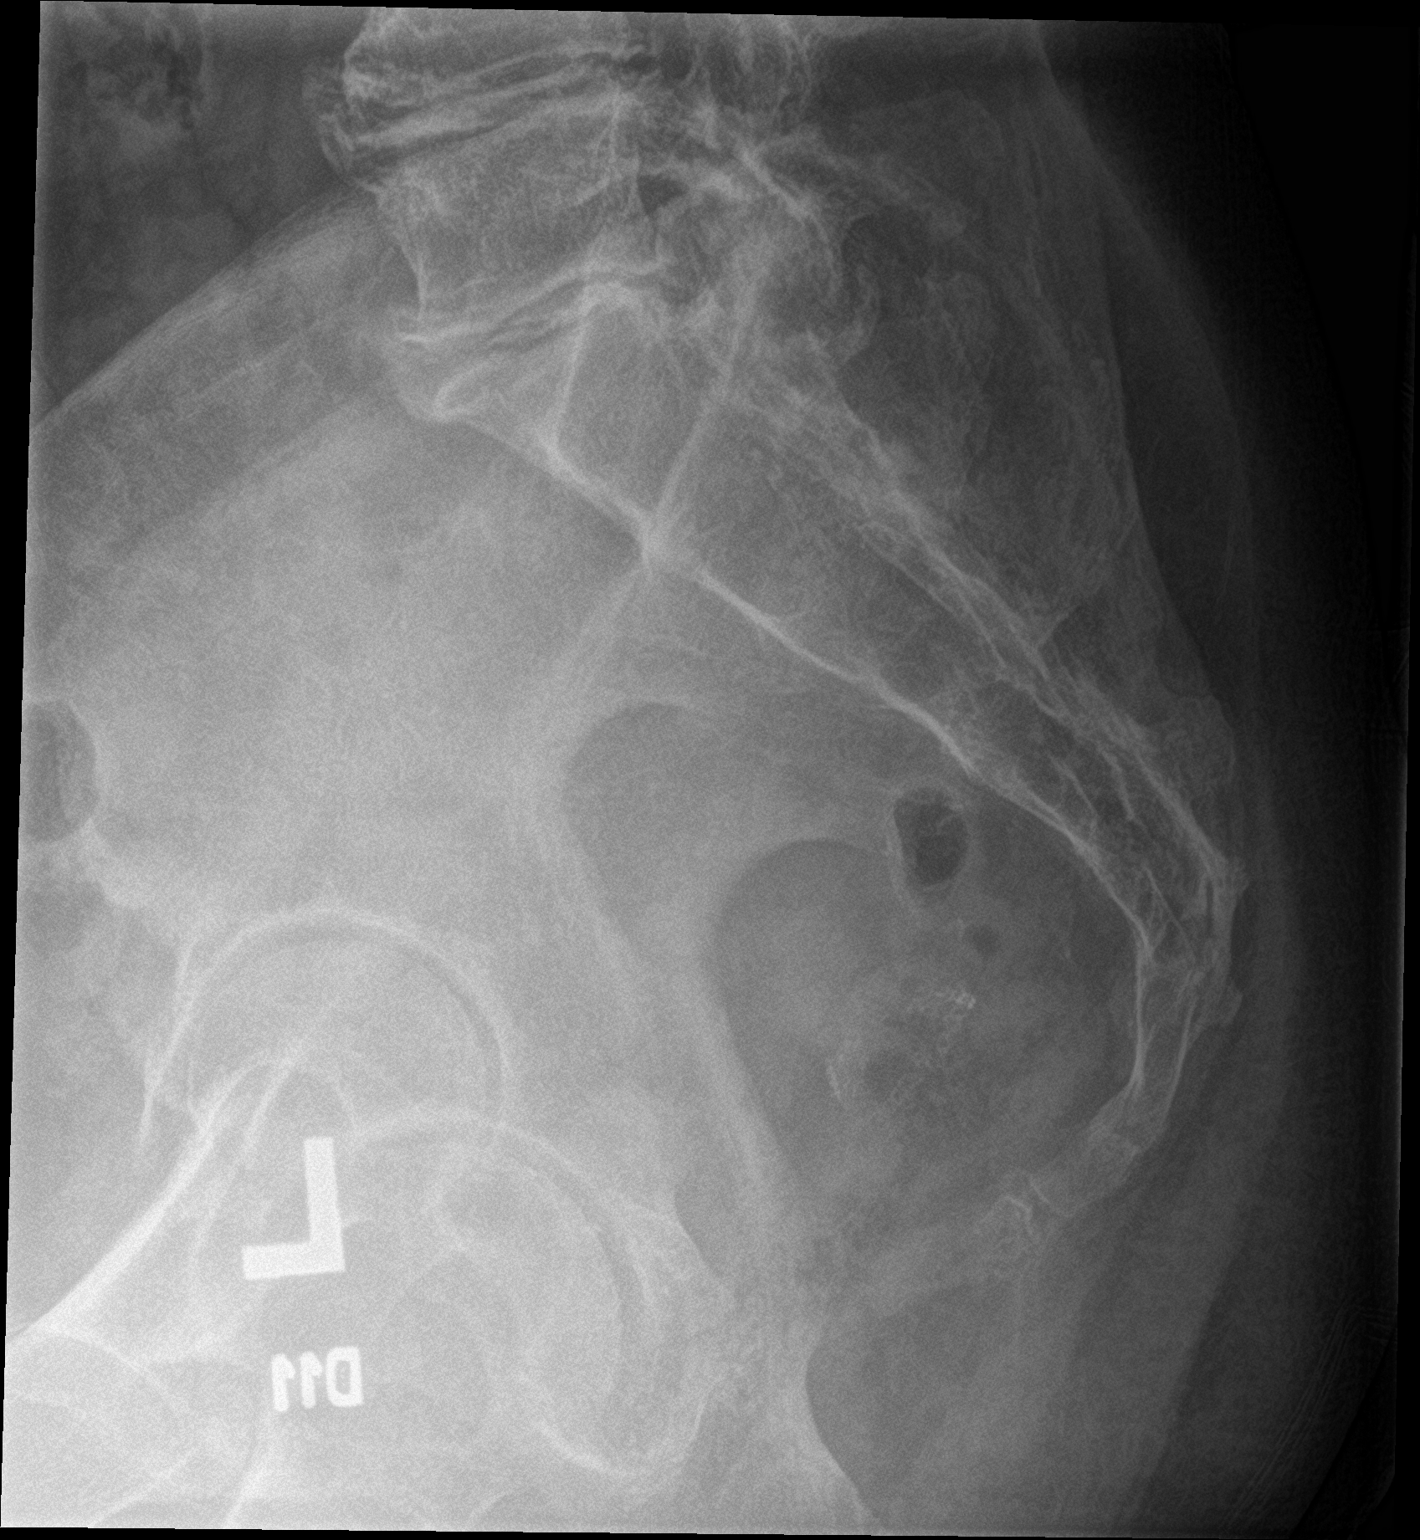

[3 of 3 positions shown; findings below may reference images not displayed]

FINDINGS: No fracture or spondylolisthesis is noted. Severe degenerative disc
disease is noted at L2-3, L3-4, L4-5 and L5-S1.
IMPRESSION: Severe multilevel degenerative disc disease. No acute abnormality
seen in the lumbar spine.

## 2023-03-03 ENCOUNTER — Other Ambulatory Visit (HOSPITAL_COMMUNITY): Payer: Self-pay | Admitting: Internal Medicine

## 2023-03-03 DIAGNOSIS — I679 Cerebrovascular disease, unspecified: Secondary | ICD-10-CM | POA: Diagnosis not present

## 2023-03-03 DIAGNOSIS — Z13820 Encounter for screening for osteoporosis: Secondary | ICD-10-CM

## 2023-03-03 DIAGNOSIS — M5000 Cervical disc disorder with myelopathy, unspecified cervical region: Secondary | ICD-10-CM | POA: Diagnosis not present

## 2023-03-03 DIAGNOSIS — E785 Hyperlipidemia, unspecified: Secondary | ICD-10-CM | POA: Diagnosis not present

## 2023-03-03 DIAGNOSIS — Z1231 Encounter for screening mammogram for malignant neoplasm of breast: Secondary | ICD-10-CM

## 2023-03-03 DIAGNOSIS — R7301 Impaired fasting glucose: Secondary | ICD-10-CM | POA: Diagnosis not present

## 2023-03-03 DIAGNOSIS — Z0001 Encounter for general adult medical examination with abnormal findings: Secondary | ICD-10-CM | POA: Diagnosis not present

## 2023-03-12 ENCOUNTER — Other Ambulatory Visit (HOSPITAL_COMMUNITY): Payer: Medicare HMO

## 2023-03-12 ENCOUNTER — Ambulatory Visit (HOSPITAL_COMMUNITY): Payer: Medicare HMO

## 2023-03-14 DIAGNOSIS — Z1212 Encounter for screening for malignant neoplasm of rectum: Secondary | ICD-10-CM | POA: Diagnosis not present

## 2023-03-14 DIAGNOSIS — Z1211 Encounter for screening for malignant neoplasm of colon: Secondary | ICD-10-CM | POA: Diagnosis not present

## 2023-03-20 ENCOUNTER — Ambulatory Visit (HOSPITAL_COMMUNITY)
Admission: RE | Admit: 2023-03-20 | Discharge: 2023-03-20 | Disposition: A | Discharge status: Home / Self Care | Payer: Medicare HMO | Source: Ambulatory Visit | Attending: Internal Medicine | Admitting: Internal Medicine

## 2023-03-20 ENCOUNTER — Encounter (HOSPITAL_COMMUNITY): Payer: Self-pay

## 2023-03-20 DIAGNOSIS — Z13820 Encounter for screening for osteoporosis: Secondary | ICD-10-CM | POA: Insufficient documentation

## 2023-03-20 DIAGNOSIS — Z1231 Encounter for screening mammogram for malignant neoplasm of breast: Secondary | ICD-10-CM | POA: Insufficient documentation

## 2023-03-20 DIAGNOSIS — Z780 Asymptomatic menopausal state: Secondary | ICD-10-CM | POA: Diagnosis not present

## 2023-03-20 DIAGNOSIS — M8589 Other specified disorders of bone density and structure, multiple sites: Secondary | ICD-10-CM | POA: Insufficient documentation

## 2023-10-09 NOTE — Therapy (Unsigned)
 OUTPATIENT PHYSICAL THERAPY LOWER EXTREMITY EVALUATION   Patient Name: Emma Henderson MRN: [id number] DOB:[birth year], 78 y.o., female Today's Date: 10/10/2023  END OF SESSION:  PT End of Session - 10/10/23 1019     Visit Number 1    Number of Visits 13    Date for Recertification  11/07/23    Authorization Type [facility]    Authorization Time Period no auth    PT Start Time 1020    PT Stop Time 1058    PT Time Calculation (min) 38 min    Activity Tolerance Patient tolerated treatment well    Behavior During Therapy [facility] for tasks assessed/performed          Past Medical History:  Diagnosis Date   Arthritis    hands (11/04/2012)   Cataract    Diverticulosis 2006   Gait abnormality 06/27/2020   Headache(784.0)    probably monthly 11/04/2012)   Osteopenia 05/2017   T score -1.8 FRAX 11.6% / 2.3%   Perforated diverticulitis 07/04/2012   Umbilical hernia 07/04/2012   Unsteady gait    Urinary incontinence    Past Surgical History:  Procedure Laterality Date   CATARACT EXTRACTION W/PHACO Left 12/11/2021   Procedure: CATARACT EXTRACTION PHACO AND INTRAOCULAR LENS PLACEMENT (IOC);  Surgeon: Juli Blunt, MD;  Location: [facility];  Service: Ophthalmology;  Laterality: Left;  CDE: 12.21   CATARACT EXTRACTION W/PHACO Right 12/28/2021   Procedure: CATARACT EXTRACTION PHACO AND INTRAOCULAR LENS PLACEMENT (IOC);  Surgeon: Juli Blunt, MD;  Location: [facility];  Service: Ophthalmology;  Laterality: Right;  CDE: 13.13   CERVICAL SPINE SURGERY  ~ 1997   COLON SURGERY     HERNIA REPAIR  11/04/2012   umbilical   LAPAROSCOPIC PARTIAL COLECTOMY N/A 11/04/2012   Procedure: LAPAROSCOPIC SIGMOID COLECTOMY;  Surgeon: Vicenta DELENA Poli, MD;  Location: [facility] OR;  Service: General;  Laterality: N/A;   LAPAROSCOPIC SIGMOID COLECTOMY  11/04/2012   ABSCESS DIVERTICULAR      LUMBAR SPINE SURGERY  ~ 1987   PROCTOPLASTY N/A 11/04/2012   Procedure: RIGID PROCTOSCOPY;  Surgeon: Vicenta DELENA Poli, MD;  Location: [facility] OR;  Service: General;  Laterality: N/A;   Patient Active Problem List   Diagnosis Date Noted   Small vessel disease, cerebrovascular 01/04/2021   Gait abnormality 06/27/2020   Diverticulitis of intestine with perforation 07/04/2012   Osteopenia 07/04/2012   Umbilical hernia 07/04/2012    PCP: Sheryle Carwin, MD  REFERRING PROVIDER: Sheryle Carwin, MD  REFERRING DIAG:  gait disorder, imbalance    THERAPY DIAG:  Muscle weakness (generalized)  Impaired functional mobility, balance, gait, and endurance  Rationale for Evaluation and Treatment: Rehabilitation  ONSET DATE: Chronic, years  SUBJECTIVE:   SUBJECTIVE STATEMENT: Patient reports she is here for her balance issues. Reports she has fallen once in last 6 months but she trips a lot and has some near misses. Reports she has some leg pain once standing in static position, LLE>RLE. Walking is fine as long as its not too far. She walks fast so she wont have to be on legs for long amount of time. Reports she developed scoliosis over time. Reports balance has been an ongoing issue but reports It has gotten worse recently  PERTINENT HISTORY: 2 previous spine surgeries: one in cervical and low back, 1980s osteopenia  PAIN:  Are you having pain? No  PRECAUTIONS: None  RED FLAGS: None   WEIGHT BEARING RESTRICTIONS: No  FALLS:  Has patient fallen in last 6  months? Yes. Number of falls last fall June 19th, most falls due to tripping secondary to not picking up her feet  LIVING ENVIRONMENT: Lives with: lives alone Lives in: House/apartment Stairs: Yes: Internal: 12-13 steps; on left going up Has following equipment at home: Single point cane, does not use  OCCUPATION: Retired Runner, broadcasting/film/video.    PLOF: Independent  PATIENT GOALS: I wish I could be more comfortable standing, and standing straighter  NEXT MD VISIT: Not set.   OBJECTIVE:  Note: Objective measures were completed at Evaluation unless  otherwise noted.  DIAGNOSTIC FINDINGS: N/A  PATIENT SURVEYS:  ABC scale: The Activities-Specific Balance Confidence (ABC) Scale 0% 10 20 30  40 50 60 70 80 90 100% No confidence<->completely confident  "How confident are you that you will not lose your balance or become unsteady when you . . .   Total ABC score: 920 / 1600 = 57.5 %   COGNITION: Overall cognitive status: Within functional limits for tasks assessed     SENSATION: Light touch: Impaired  Dec sensation on L L1-2 dermatomes   POSTURE: rounded shoulders, forward head, flexed trunk , and laterally flexed to R   PALPATION:   LOWER EXTREMITY ROM:  Active ROM Right eval Left eval  Hip flexion    Hip extension    Hip abduction    Hip adduction    Hip internal rotation    Hip external rotation    Knee flexion    Knee extension    Ankle dorsiflexion    Ankle plantarflexion    Ankle inversion    Ankle eversion     (Blank rows = not tested)  LOWER EXTREMITY MMT:  MMT Right eval Left eval  Hip flexion 4 4-  Hip extension 4 4-  Hip abduction 4 4-  Hip adduction    Hip internal rotation    Hip external rotation    Knee flexion    Knee extension    Ankle dorsiflexion 5 5  Ankle plantarflexion    Ankle inversion    Ankle eversion     (Blank rows = not tested)  LOWER EXTREMITY SPECIAL TESTS:    FUNCTIONAL TESTS:  5 times sit to stand: 11 first trial, 8 second trial  Timed up and go (TUG): 9 no AD   2 minute walk test: Next Session   Four Stage Balance:  Side by side: 30, BLE very shaky due to fatigue Semi tandem: 9 with RLE leading  Tandem: Unable with RLE leading SL: Next session   GAIT: Distance walked: 75 ft in session  Assistive device utilized: None Level of assistance: Complete Independence Comments: slightly inc gait velocity, lateral lean to R due to scoliosis, dec stance time in LLE                                                                                                                                 TREATMENT DATE:  10/10/23: PT  Eval and HEP   PATIENT EDUCATION:  Education details: PT evaluation, objective findings, POC, Importance of HEP, Precautions, Clinic policies, Anatomy and Physiology, Possible causes of imbalance Person educated: Patient Education method: Explanation and Demonstration Education comprehension: verbalized understanding and returned demonstration  HOME EXERCISE PROGRAM: Access Code: [email or web address] URL: [email or web address] Date: 10/10/2023 Prepared by: Rosaria Powell-Butler  Exercises - Supine Bridge  - 2 x daily - 7 x weekly - 2 sets - 10 reps - 5 hold - Sidelying Hip Abduction  - 2 x daily - 7 x weekly - 2 sets - 10 reps - Sit to Stand  - 2 x daily - 7 x weekly - 3 sets - 10 reps - Supine Transversus Abdominis Bracing - Hands on Stomach  - 2 x daily - 7 x weekly - 2 sets - 10 reps - 10 hold - Standing Tandem Balance with Counter Support  - 2 x daily - 7 x weekly - 3 sets - 30 hold  ASSESSMENT:  CLINICAL IMPRESSION: Patient is a 78 y.o. female who was seen today for physical therapy evaluation and treatment for  gait disorder, imbalance  . On this date, patient demonstrates impaired self perception of balance via ABC scale, decreased LE strength (L>R), poor posture, dec endurance, and impaired balance, all of which may be contributing to patients decreased activity tolerance, impaired gait, and fall risk. Patient will benefit from continued skilled physical therapy in order to address these deficits/impairments to improve overall function and quality of life.    OBJECTIVE IMPAIRMENTS: Abnormal gait, decreased activity tolerance, decreased balance, decreased coordination, decreased endurance, decreased mobility, difficulty walking, decreased ROM, decreased strength, impaired flexibility, improper body mechanics, postural dysfunction, and pain.   ACTIVITY LIMITATIONS: carrying, lifting, bending, sitting, standing,  squatting, stairs, transfers, and reach over head  PARTICIPATION LIMITATIONS: meal prep, cleaning, laundry, community activity, and yard work  PERSONAL FACTORS: Time since onset of injury/illness/exacerbation are also affecting patient's functional outcome.   REHAB POTENTIAL: Good  CLINICAL DECISION MAKING: Stable/uncomplicated  EVALUATION COMPLEXITY: Low   GOALS: Goals reviewed with patient? No  SHORT TERM GOALS: Target date: 10/31/23 Patient will be independent with performance of HEP to demonstrate adequate self management of symptoms.  Baseline:  Goal status: INITIAL  2.   Patient will report at least a 25% improvement with function and/or pain reduction overall since beginning PT. Baseline:  Goal status: INITIAL   LONG TERM GOALS: Target date: 11/21/23 Patient will improve ABC Scale score by 10% to demonstrate improved perceived function while meeting MCID.  Baseline: Goal status: INITIAL 2.  Patient will be able to statically stand at least 2 minutes without UE support to demonstrate improved standing tolerance needed for community ambulation.  Baseline:  Goal status: INITIAL 3.  Patient will score at least a  4/5 on  LLE MMT to demonstrate increased LE strength and/or power needed to improve ambulation/gait mechanics.  Baseline:  Goal status: INITIAL   4.  Patient will increase 2 minute walk test gait distance to 30 ft with least restrictive assistive device to demonstrate improved endurance and functional mobility needed for ambulating household and community distances.  Baseline: Goal status: INITIAL  5. Patient will be able to maintain tandem balance bilaterally for at least 10 seconds without UE support to demonstrate improved balance needed to demonstrate decreased fall risk.  Baseline: Goal status: INITIAL   PLAN:  PT FREQUENCY: 2x/week  PT DURATION: 6 weeks  PLANNED INTERVENTIONS: [id number]- PT Re-evaluation, 97110-Therapeutic exercises, 97530-  Therapeutic activity,  02887- Neuromuscular re-education, [phone]- Self Care, 02859- Manual therapy, [id number]- Gait training, [phone]- Electrical stimulation (manual), [id number]- Traction (mechanical), [phone] (1-2 muscles), 20561 (3+ muscles)- Dry Needling, Patient/Family education, Balance training, Stair training, Taping, Joint mobilization, Spinal mobilization, Cryotherapy, and Moist heat  PLAN FOR NEXT SESSION: Review HEP and goals, test, complete tandem and SL balance assessment for 4 stage balance, DGI or FGA, LE strengthening, core strengthening   12:36 PM, 10/10/23 Agnes Probert Powell-Butler, PT, DPT [facility] [place]

## 2023-10-09 NOTE — Therapy (Incomplete)
 OUTPATIENT PHYSICAL THERAPY LOWER EXTREMITY EVALUATION   Patient Name: Emma Henderson MRN: [id number] DOB:April 17, 1945, 78 y.o., female Today's Date: 10/09/2023  END OF SESSION:   Past Medical History:  Diagnosis Date   Arthritis    hands (11/04/2012)   Cataract    Diverticulosis 2006   Gait abnormality 06/27/2020   Headache(784.0)    probably monthly 11/04/2012)   Osteopenia 05/2017   T score -1.8 FRAX 11.6% / 2.3%   Perforated diverticulitis 07/04/2012   Umbilical hernia 07/04/2012   Unsteady gait    Urinary incontinence    Past Surgical History:  Procedure Laterality Date   CATARACT EXTRACTION W/PHACO Left 12/11/2021   Procedure: CATARACT EXTRACTION PHACO AND INTRAOCULAR LENS PLACEMENT (IOC);  Surgeon: Juli Blunt, MD;  Location: [facility];  Service: Ophthalmology;  Laterality: Left;  CDE: 12.21   CATARACT EXTRACTION W/PHACO Right 12/28/2021   Procedure: CATARACT EXTRACTION PHACO AND INTRAOCULAR LENS PLACEMENT (IOC);  Surgeon: Juli Blunt, MD;  Location: [facility];  Service: Ophthalmology;  Laterality: Right;  CDE: 13.13   CERVICAL SPINE SURGERY  ~ 1997   COLON SURGERY     HERNIA REPAIR  11/04/2012   umbilical   LAPAROSCOPIC PARTIAL COLECTOMY N/A 11/04/2012   Procedure: LAPAROSCOPIC SIGMOID COLECTOMY;  Surgeon: Vicenta DELENA Poli, MD;  Location: [facility];  Service: General;  Laterality: N/A;   LAPAROSCOPIC SIGMOID COLECTOMY  11/04/2012   ABSCESS DIVERTICULAR      LUMBAR SPINE SURGERY  ~ 1987   PROCTOPLASTY N/A 11/04/2012   Procedure: RIGID PROCTOSCOPY;  Surgeon: Vicenta DELENA Poli, MD;  Location: [facility];  Service: General;  Laterality: N/A;   Patient Active Problem List   Diagnosis Date Noted   Small vessel disease, cerebrovascular 01/04/2021   Gait abnormality 06/27/2020   Diverticulitis of intestine with perforation 07/04/2012   Osteopenia 07/04/2012   Umbilical hernia 07/04/2012    PCP: Sheryle Carwin, MD REFERRING PROVIDER: Sheryle Carwin, MD  REFERRING DIAG: gait  disorder, imbalance  THERAPY DIAG:  No diagnosis found.  Rationale for Evaluation and Treatment: Rehabilitation  ONSET DATE: ***  SUBJECTIVE:   SUBJECTIVE STATEMENT: ***  PERTINENT HISTORY: *** PAIN:  Are you having pain? {OPRCPAIN:27236}  PRECAUTIONS: {Therapy precautions:24002}  RED FLAGS: {PT Red Flags:29287}   WEIGHT BEARING RESTRICTIONS: {Yes ***/No:24003}  FALLS:  Has patient fallen in last 6 months? {fallsyesno:27318}  LIVING ENVIRONMENT: Lives with: {OPRC lives with:25569::lives with their family} Lives in: {Lives in:25570} Stairs: {opstairs:27293} Has following equipment at home: {Assistive devices:23999}  OCCUPATION: ***  PLOF: {PLOF:24004}  PATIENT GOALS: ***  NEXT MD VISIT: ***  OBJECTIVE:  Note: Objective measures were completed at Evaluation unless otherwise noted.  DIAGNOSTIC FINDINGS: ***  PATIENT SURVEYS:  {rehab surveys:24030}  COGNITION: Overall cognitive status: {cognition:24006}     SENSATION: {sensation:27233}  EDEMA:  {edema:24020}  MUSCLE LENGTH: Hamstrings: Right *** deg; Left *** deg Debby test: Right *** deg; Left *** deg  POSTURE: {posture:25561}  PALPATION: ***  LOWER EXTREMITY ROM:  Active ROM Right eval Left eval  Hip flexion    Hip extension    Hip abduction    Hip adduction    Hip internal rotation    Hip external rotation    Knee flexion    Knee extension    Ankle dorsiflexion    Ankle plantarflexion    Ankle inversion    Ankle eversion     (Blank rows = not tested)  LOWER EXTREMITY MMT:  MMT Right eval Left eval  Hip flexion    Hip  extension    Hip abduction    Hip adduction    Hip internal rotation    Hip external rotation    Knee flexion    Knee extension    Ankle dorsiflexion    Ankle plantarflexion    Ankle inversion    Ankle eversion     (Blank rows = not tested)  LOWER EXTREMITY SPECIAL TESTS:  {LEspecialtests:26242}  FUNCTIONAL TESTS:  {Functional  tests:24029}  GAIT: Distance walked: *** Assistive device utilized: {Assistive devices:23999} Level of assistance: {Levels of assistance:24026} Comments: ***                                                                                                                                TREATMENT DATE: 10/10/23 physical therapy evaluation and HEP instruction    PATIENT EDUCATION:  Education details: Patient educated on exam findings, POC, scope of PT, HEP, and ***. Person educated: Patient Education method: Explanation, Demonstration, and Handouts Education comprehension: verbalized understanding, returned demonstration, verbal cues required, and tactile cues required  HOME EXERCISE PROGRAM: ***  ASSESSMENT:  CLINICAL IMPRESSION: Patient is a 78 y.o. female who was seen today for physical therapy evaluation and treatment for gait disorder, imbalance.   OBJECTIVE IMPAIRMENTS: {opptimpairments:25111}.   ACTIVITY LIMITATIONS: {activitylimitations:27494}  PARTICIPATION LIMITATIONS: {participationrestrictions:25113}  PERSONAL FACTORS: {Personal factors:25162} are also affecting patient's functional outcome.   REHAB POTENTIAL: Good  CLINICAL DECISION MAKING: Evolving/moderate complexity  EVALUATION COMPLEXITY: Moderate   GOALS: Goals reviewed with patient? No  SHORT TERM GOALS: Target date: *** *** Baseline: Goal status: INITIAL  2.  *** Baseline:  Goal status: INITIAL  3.  *** Baseline:  Goal status: INITIAL  4.  *** Baseline:  Goal status: INITIAL  5.  *** Baseline:  Goal status: INITIAL  6.  *** Baseline:  Goal status: INITIAL  LONG TERM GOALS: Target date: ***  *** Baseline:  Goal status: INITIAL  2.  *** Baseline:  Goal status: INITIAL  3.  *** Baseline:  Goal status: INITIAL  4.  *** Baseline:  Goal status: INITIAL  5.  *** Baseline:  Goal status: INITIAL  6.  *** Baseline:  Goal status: INITIAL   PLAN:  PT FREQUENCY:  {rehab frequency:25116}  PT DURATION: {rehab duration:25117}  PLANNED INTERVENTIONS: 97164- PT Re-evaluation, 97110-Therapeutic exercises, 97530- Therapeutic activity, 97112- Neuromuscular re-education, 97535- Self Care, 02859- Manual therapy, [id number]- Gait training, [id number]- Orthotic Fit/training, [id number]- Canalith repositioning, [id number]- Aquatic Therapy, 97760- Splinting, 97597- Wound care (first 20 sq cm), 97598- Wound care (each additional 20 sq cm)Patient/Family education, Balance training, Stair training, Taping, Dry Needling, Joint mobilization, Joint manipulation, Spinal manipulation, Spinal mobilization, Scar mobilization, and DME instructions.   PLAN FOR NEXT SESSION: Review of HEP and goals;    2:55 PM, 10/09/23 Adaisha Campise Small Camila Maita MPT [facility] [place] [phone]

## 2023-10-10 ENCOUNTER — Encounter (HOSPITAL_COMMUNITY): Payer: Self-pay

## 2023-10-10 ENCOUNTER — Other Ambulatory Visit: Payer: Self-pay

## 2023-10-10 ENCOUNTER — Ambulatory Visit (HOSPITAL_COMMUNITY): Attending: Internal Medicine

## 2023-10-10 DIAGNOSIS — M6281 Muscle weakness (generalized): Secondary | ICD-10-CM | POA: Insufficient documentation

## 2023-10-10 DIAGNOSIS — Z7409 Other reduced mobility: Secondary | ICD-10-CM | POA: Diagnosis not present

## 2023-10-13 ENCOUNTER — Encounter (HOSPITAL_COMMUNITY): Payer: Self-pay

## 2023-10-13 ENCOUNTER — Ambulatory Visit (HOSPITAL_COMMUNITY)

## 2023-10-13 DIAGNOSIS — Z7409 Other reduced mobility: Secondary | ICD-10-CM

## 2023-10-13 DIAGNOSIS — M6281 Muscle weakness (generalized): Secondary | ICD-10-CM

## 2023-10-13 NOTE — Therapy (Signed)
 OUTPATIENT PHYSICAL THERAPY LOWER EXTREMITY TREATMENT   Patient Name: Emma Henderson MRN: [id number] DOB:1945/05/06, 78 y.o., female Today's Date: 10/13/2023  END OF SESSION:  PT End of Session - 10/13/23 0905     Visit Number 2    Number of Visits 13    Date for Recertification  11/07/23    Authorization Type [facility]    Authorization Time Period no shara    PT Start Time 0905    PT Stop Time 0943    PT Time Calculation (min) 38 min    Equipment Utilized During Treatment Gait belt    Activity Tolerance Patient tolerated treatment well;Patient limited by fatigue    Behavior During Therapy [facility] for tasks assessed/performed          Past Medical History:  Diagnosis Date   Arthritis    hands (11/04/2012)   Cataract    Diverticulosis 2006   Gait abnormality 06/27/2020   Headache(784.0)    probably monthly 11/04/2012)   Osteopenia 05/2017   T score -1.8 FRAX 11.6% / 2.3%   Perforated diverticulitis 07/04/2012   Umbilical hernia 07/04/2012   Unsteady gait    Urinary incontinence    Past Surgical History:  Procedure Laterality Date   CATARACT EXTRACTION W/PHACO Left 12/11/2021   Procedure: CATARACT EXTRACTION PHACO AND INTRAOCULAR LENS PLACEMENT (IOC);  Surgeon: Juli Blunt, MD;  Location: [facility];  Service: Ophthalmology;  Laterality: Left;  CDE: 12.21   CATARACT EXTRACTION W/PHACO Right 12/28/2021   Procedure: CATARACT EXTRACTION PHACO AND INTRAOCULAR LENS PLACEMENT (IOC);  Surgeon: Juli Blunt, MD;  Location: [facility];  Service: Ophthalmology;  Laterality: Right;  CDE: 13.13   CERVICAL SPINE SURGERY  ~ 1997   COLON SURGERY     HERNIA REPAIR  11/04/2012   umbilical   LAPAROSCOPIC PARTIAL COLECTOMY N/A 11/04/2012   Procedure: LAPAROSCOPIC SIGMOID COLECTOMY;  Surgeon: Vicenta DELENA Poli, MD;  Location: [facility];  Service: General;  Laterality: N/A;   LAPAROSCOPIC SIGMOID COLECTOMY  11/04/2012   ABSCESS DIVERTICULAR      LUMBAR SPINE SURGERY  ~ 1987    PROCTOPLASTY N/A 11/04/2012   Procedure: RIGID PROCTOSCOPY;  Surgeon: Vicenta DELENA Poli, MD;  Location: [facility];  Service: General;  Laterality: N/A;   Patient Active Problem List   Diagnosis Date Noted   Small vessel disease, cerebrovascular 01/04/2021   Gait abnormality 06/27/2020   Diverticulitis of intestine with perforation 07/04/2012   Osteopenia 07/04/2012   Umbilical hernia 07/04/2012    PCP: Sheryle Carwin, MD  REFERRING PROVIDER: Sheryle Carwin, MD  REFERRING DIAG:  gait disorder, imbalance    THERAPY DIAG:  Muscle weakness (generalized)  Impaired functional mobility, balance, gait, and endurance  Rationale for Evaluation and Treatment: Rehabilitation  ONSET DATE: Chronic, years  SUBJECTIVE:   SUBJECTIVE STATEMENT: 10/13/23:  Feeling good today, no reports of recent fall or pain.  Does feel imbalanced while static standing and walking.    Eval:  Patient reports she is here for her balance issues. Reports she has fallen once in last 6 months but she trips a lot and has some near misses. Reports she has some leg pain once standing in static position, LLE>RLE. Walking is fine as long as its not too far. She walks fast so she wont have to be on legs for long amount of time. Reports she developed scoliosis over time. Reports balance has been an ongoing issue but reports It has gotten worse recently  PERTINENT HISTORY: 2 previous spine surgeries:  one in cervical and low back, 1980s osteopenia  PAIN:  Are you having pain? No  PRECAUTIONS: None  RED FLAGS: None   WEIGHT BEARING RESTRICTIONS: No  FALLS:  Has patient fallen in last 6 months? Yes. Number of falls last fall June 19th, most falls due to tripping secondary to not picking up her feet  LIVING ENVIRONMENT: Lives with: lives alone Lives in: House/apartment Stairs: Yes: Internal: 12-13 steps; on left going up Has following equipment at home: Single point cane, does not use  OCCUPATION: Retired Runner, broadcasting/film/video.     PLOF: Independent  PATIENT GOALS: I wish I could be more comfortable standing, and standing straighter  NEXT MD VISIT: Not set.   OBJECTIVE:  Note: Objective measures were completed at Evaluation unless otherwise noted.  DIAGNOSTIC FINDINGS: N/A  PATIENT SURVEYS:  ABC scale: The Activities-Specific Balance Confidence (ABC) Scale 0% 10 20 30  40 50 60 70 80 90 100% No confidence<->completely confident  "How confident are you that you will not lose your balance or become unsteady when you . . .   Total ABC score: 920 / 1600 = 57.5 %   COGNITION: Overall cognitive status: Within functional limits for tasks assessed     SENSATION: Light touch: Impaired  Dec sensation on L L1-2 dermatomes   POSTURE: rounded shoulders, forward head, flexed trunk , and laterally flexed to R   PALPATION:   LOWER EXTREMITY ROM:  Active ROM Right eval Left eval  Hip flexion    Hip extension    Hip abduction    Hip adduction    Hip internal rotation    Hip external rotation    Knee flexion    Knee extension    Ankle dorsiflexion    Ankle plantarflexion    Ankle inversion    Ankle eversion     (Blank rows = not tested)  LOWER EXTREMITY MMT:  MMT Right eval Left eval  Hip flexion 4 4-  Hip extension 4 4-  Hip abduction 4 4-  Hip adduction    Hip internal rotation    Hip external rotation    Knee flexion    Knee extension    Ankle dorsiflexion 5 5  Ankle plantarflexion    Ankle inversion    Ankle eversion     (Blank rows = not tested)  LOWER EXTREMITY SPECIAL TESTS:    FUNCTIONAL TESTS:  5 times sit to stand: 11 first trial, 8 second trial  Timed up and go (TUG): 9 no AD   2 minute walk test: 10/13/23: 483 ft no AD   10/13/23: Four Stage Balance:  Side by side: 30, BLE very shaky due to fatigue, 10/13/23: 11.06 prior need for UE support Semi tandem: 9 with RLE leading     7.53 With Lt LE leading Tandem: 25 with RLE leading, very shaky due to  fatigue    3 Lt LE leading SL:  Lt 3   Rt 4  GAIT: Distance walked: 75 ft in session  Assistive device utilized: None Level of assistance: Complete Independence Comments: slightly inc gait velocity, lateral lean to R due to scoliosis, dec stance time in LLE  TREATMENT DATE:  10/13/23: Reviewed goals Educated importance of HEP Compliance 478ft no AD 4 stage balance assessment (see above)  DGI 1. Gait level surface (1) Moderate Impairment: Walks 20', slow speed, abnormal gait pattern, evidence for imbalance. 2. Change in gait speed (1) Moderate Impairment: Makes only minor adjustments to walking speed, or accomplishes a change in speed with significant gait deviations, or changes speed but has significant gait deviations, or changes speed but loses balance but is able to recover and continue walking. 3. Gait with horizontal head turns (2) Mild Impairment: Performs head turns smoothly with slight change in gait velocity, i.e., minor disruption to smooth gait path or uses walking aid. 4. Gait with vertical head turns 1) Moderate Impairment: Performs head turns with moderate change in gait velocity, slows down, staggers but recovers, can continue to walk. 5. Gait and pivot turn (1) Moderate Impairment: Turns slowly, requires verbal cueing, requires several small steps to catch balance following turn and stop. 6. Step over obstacle (1) Moderate Impairment: Is able to step over box but must stop, then step over. May require verbal cueing. 7. Step around obstacles (2) Mild Impairment: Is able to step around both cones, but must slow down and adjust steps to clear cones. 8. Stairs (2) Mild Impairment: Alternating feet, must use rail.  TOTAL SCORE: 11 / 24  Sidestep 1RT step and pause x 12 ft cueing for posture, required HHA against wall  10/10/23: PT  Eval and HEP   PATIENT EDUCATION:  Education details: PT evaluation, objective findings, POC, Importance of HEP, Precautions, Clinic policies, Anatomy and Physiology, Possible causes of imbalance Person educated: Patient Education method: Explanation and Demonstration Education comprehension: verbalized understanding and returned demonstration  HOME EXERCISE PROGRAM: Access Code: [email or web address] URL: [email or web address] Date: 10/10/2023 Prepared by: Rosaria Powell-Butler  Exercises - Supine Bridge  - 2 x daily - 7 x weekly - 2 sets - 10 reps - 5 hold - Sidelying Hip Abduction  - 2 x daily - 7 x weekly - 2 sets - 10 reps - Sit to Stand  - 2 x daily - 7 x weekly - 3 sets - 10 reps - Supine Transversus Abdominis Bracing - Hands on Stomach  - 2 x daily - 7 x weekly - 2 sets - 10 reps - 10 hold - Standing Tandem Balance with Counter Support  - 2 x daily - 7 x weekly - 3 sets - 30 hold  ASSESSMENT:  CLINICAL IMPRESSION: 10/13/23:  Reviewed goals and educated importance of HEP compliance for maximal benefits, pt able to verbalize current exercise program.  Objective testing for further balance assessment complete.  Pt unable to stand still without reaching for wall or object through session.  Pt educated on importance of posture through session to assist with balance.  Presents with hinges hips with rolled shoulders and forward head.  Min A for safety during DGI.  Pt with extreme difficulty with slow gait mechanics and static balance.  No reports of pain through session, was easily fatigued with seated rest breaks between each activity.  Eval:  Patient is a 78 y.o. female who was seen today for physical therapy evaluation and treatment for  gait disorder, imbalance  . On this date, patient demonstrates impaired self perception of balance via ABC scale, decreased LE strength (L>R), poor posture, dec endurance, and impaired balance, all of which may be contributing to patients decreased  activity tolerance, impaired gait, and fall risk. Patient will benefit from continued skilled physical  therapy in order to address these deficits/impairments to improve overall function and quality of life.    OBJECTIVE IMPAIRMENTS: Abnormal gait, decreased activity tolerance, decreased balance, decreased coordination, decreased endurance, decreased mobility, difficulty walking, decreased ROM, decreased strength, impaired flexibility, improper body mechanics, postural dysfunction, and pain.   ACTIVITY LIMITATIONS: carrying, lifting, bending, sitting, standing, squatting, stairs, transfers, and reach over head  PARTICIPATION LIMITATIONS: meal prep, cleaning, laundry, community activity, and yard work  PERSONAL FACTORS: Time since onset of injury/illness/exacerbation are also affecting patient's functional outcome.   REHAB POTENTIAL: Good  CLINICAL DECISION MAKING: Stable/uncomplicated  EVALUATION COMPLEXITY: Low   GOALS: Goals reviewed with patient? No  SHORT TERM GOALS: Target date: 10/31/23 Patient will be independent with performance of HEP to demonstrate adequate self management of symptoms.  Baseline:  Goal status: INITIAL  2.   Patient will report at least a 25% improvement with function and/or pain reduction overall since beginning PT. Baseline:  Goal status: INITIAL   LONG TERM GOALS: Target date: 11/21/23 Patient will improve ABC Scale score by 10% to demonstrate improved perceived function while meeting [facility]ID.  Baseline: Goal status: INITIAL 2.  Patient will be able to statically stand at least 2 minutes without UE support to demonstrate improved standing tolerance needed for community ambulation.  Baseline:  Goal status: INITIAL 3.  Patient will score at least a  4/5 on  LLE MMT to demonstrate increased LE strength and/or power needed to improve ambulation/gait mechanics.  Baseline:  Goal status: INITIAL   4.  Patient will increase 2 minute walk test gait distance  to 30 ft with least restrictive assistive device to demonstrate improved endurance and functional mobility needed for ambulating household and community distances.  Baseline: Goal status: INITIAL  5. Patient will be able to maintain tandem balance bilaterally for at least 10 seconds without UE support to demonstrate improved balance needed to demonstrate decreased fall risk.  Baseline: Goal status: INITIAL   PLAN:  PT FREQUENCY: 2x/week  PT DURATION: 6 weeks  PLANNED INTERVENTIONS: 97164- PT Re-evaluation, 97110-Therapeutic exercises, 97530- Therapeutic activity, [id number]- Neuromuscular re-education, 97535- Self Care, 02859- Manual therapy, [id number]- Gait training, [phone]- Electrical stimulation (manual), [id number]- Traction (mechanical), [phone] (1-2 muscles), 20561 (3+ muscles)- Dry Needling, Patient/Family education, Balance training, Stair training, Taping, Joint mobilization, Spinal mobilization, Cryotherapy, and Moist heat  PLAN FOR NEXT SESSION: Posture, static balance then gait associated balance activities, LE strengthening, core strengthening.  Next session add Nustep, hip flexor stretch, toe tapping, etc...  Augustin Mclean, LPTA/CLT; CBIS [phone]  9:58 AM, 10/13/23

## 2023-10-15 ENCOUNTER — Ambulatory Visit (HOSPITAL_COMMUNITY)

## 2023-10-15 ENCOUNTER — Encounter (HOSPITAL_COMMUNITY): Payer: Self-pay

## 2023-10-15 DIAGNOSIS — M6281 Muscle weakness (generalized): Secondary | ICD-10-CM

## 2023-10-15 DIAGNOSIS — Z7409 Other reduced mobility: Secondary | ICD-10-CM

## 2023-10-15 NOTE — Therapy (Signed)
 OUTPATIENT PHYSICAL THERAPY LOWER EXTREMITY TREATMENT   Patient Name: Emma Henderson MRN: [id number] DOB:[birth year], 78 y.o., female Today's Date: 10/15/2023  END OF SESSION:  PT End of Session - 10/15/23 0901     Visit Number 3    Number of Visits 13    Date for Recertification  11/07/23    Authorization Type [facility]    Authorization Time Period no auth    PT Start Time 0901    PT Stop Time 0940    PT Time Calculation (min) 39 min    Equipment Utilized During Treatment Gait belt    Activity Tolerance Patient tolerated treatment well;Patient limited by fatigue    Behavior During Therapy [facility] for tasks assessed/performed          Past Medical History:  Diagnosis Date   Arthritis    hands (11/04/2012)   Cataract    Diverticulosis 2006   Gait abnormality 06/27/2020   Headache(784.0)    probably monthly 11/04/2012)   Osteopenia 05/2017   T score -1.8 FRAX 11.6% / 2.3%   Perforated diverticulitis 07/04/2012   Umbilical hernia 07/04/2012   Unsteady gait    Urinary incontinence    Past Surgical History:  Procedure Laterality Date   CATARACT EXTRACTION W/PHACO Left 12/11/2021   Procedure: CATARACT EXTRACTION PHACO AND INTRAOCULAR LENS PLACEMENT (IOC);  Surgeon: Juli Blunt, MD;  Location: [facility];  Service: Ophthalmology;  Laterality: Left;  CDE: 12.21   CATARACT EXTRACTION W/PHACO Right 12/28/2021   Procedure: CATARACT EXTRACTION PHACO AND INTRAOCULAR LENS PLACEMENT (IOC);  Surgeon: Juli Blunt, MD;  Location: [facility];  Service: Ophthalmology;  Laterality: Right;  CDE: 13.13   CERVICAL SPINE SURGERY  ~ 1997   COLON SURGERY     HERNIA REPAIR  11/04/2012   umbilical   LAPAROSCOPIC PARTIAL COLECTOMY N/A 11/04/2012   Procedure: LAPAROSCOPIC SIGMOID COLECTOMY;  Surgeon: Vicenta DELENA Poli, MD;  Location: [facility];  Service: General;  Laterality: N/A;   LAPAROSCOPIC SIGMOID COLECTOMY  11/04/2012   ABSCESS DIVERTICULAR      LUMBAR SPINE SURGERY  ~ 1987    PROCTOPLASTY N/A 11/04/2012   Procedure: RIGID PROCTOSCOPY;  Surgeon: Vicenta DELENA Poli, MD;  Location: [facility];  Service: General;  Laterality: N/A;   Patient Active Problem List   Diagnosis Date Noted   Small vessel disease, cerebrovascular 01/04/2021   Gait abnormality 06/27/2020   Diverticulitis of intestine with perforation 07/04/2012   Osteopenia 07/04/2012   Umbilical hernia 07/04/2012    PCP: Sheryle Carwin, MD  REFERRING PROVIDER: Sheryle Carwin, MD  REFERRING DIAG:  gait disorder, imbalance    THERAPY DIAG:  Muscle weakness (generalized)  Impaired functional mobility, balance, gait, and endurance  Rationale for Evaluation and Treatment: Rehabilitation  ONSET DATE: Chronic, years  SUBJECTIVE:   SUBJECTIVE STATEMENT: 10/15/23:  Stated she was tired following last session.  Difficulty walking slow and standing.  No reports of pain or recent fall.  Completed her exercises prior session.  Eval:  Patient reports she is here for her balance issues. Reports she has fallen once in last 6 months but she trips a lot and has some near misses. Reports she has some leg pain once standing in static position, LLE>RLE. Walking is fine as long as its not too far. She walks fast so she wont have to be on legs for long amount of time. Reports she developed scoliosis over time. Reports balance has been an ongoing issue but reports It has gotten worse recently  PERTINENT HISTORY: 2 previous spine surgeries: one in cervical and low back, 1980s osteopenia  PAIN:  Are you having pain? No  PRECAUTIONS: None  RED FLAGS: None   WEIGHT BEARING RESTRICTIONS: No  FALLS:  Has patient fallen in last 6 months? Yes. Number of falls last fall June 19th, most falls due to tripping secondary to not picking up her feet  LIVING ENVIRONMENT: Lives with: lives alone Lives in: House/apartment Stairs: Yes: Internal: 12-13 steps; on left going up Has following equipment at home: Single point cane, does  not use  OCCUPATION: Retired Runner, broadcasting/film/video.    PLOF: Independent  PATIENT GOALS: I wish I could be more comfortable standing, and standing straighter  NEXT MD VISIT: Not set.   OBJECTIVE:  Note: Objective measures were completed at Evaluation unless otherwise noted.  DIAGNOSTIC FINDINGS: N/A  PATIENT SURVEYS:  ABC scale: The Activities-Specific Balance Confidence (ABC) Scale 0% 10 20 30  40 50 60 70 80 90 100% No confidence<->completely confident  "How confident are you that you will not lose your balance or become unsteady when you . . .   Total ABC score: 920 / 1600 = 57.5 %   COGNITION: Overall cognitive status: Within functional limits for tasks assessed     SENSATION: Light touch: Impaired  Dec sensation on L L1-2 dermatomes   POSTURE: rounded shoulders, forward head, flexed trunk , and laterally flexed to R   PALPATION:   LOWER EXTREMITY ROM:  Active ROM Right eval Left eval  Hip flexion    Hip extension    Hip abduction    Hip adduction    Hip internal rotation    Hip external rotation    Knee flexion    Knee extension    Ankle dorsiflexion    Ankle plantarflexion    Ankle inversion    Ankle eversion     (Blank rows = not tested)  LOWER EXTREMITY MMT:  MMT Right eval Left eval  Hip flexion 4 4-  Hip extension 4 4-  Hip abduction 4 4-  Hip adduction    Hip internal rotation    Hip external rotation    Knee flexion    Knee extension    Ankle dorsiflexion 5 5  Ankle plantarflexion    Ankle inversion    Ankle eversion     (Blank rows = not tested)  LOWER EXTREMITY SPECIAL TESTS:    FUNCTIONAL TESTS:  5 times sit to stand: 11 first trial, 8 second trial  Timed up and go (TUG): 9 no AD   2 minute walk test: 10/13/23: 483 ft no AD   10/13/23: Four Stage Balance:  Side by side: 30, BLE very shaky due to fatigue, 10/13/23: 11.06 prior need for UE support Semi tandem: 9 with RLE leading     7.53 With Lt LE leading Tandem: 25 with RLE  leading, very shaky due to fatigue    3 Lt LE leading SL:  Lt 3   Rt 4  GAIT: Distance walked: 75 ft in session  Assistive device utilized: None Level of assistance: Complete Independence Comments: slightly inc gait velocity, lateral lean to R due to scoliosis, dec stance time in LLE  TREATMENT DATE:  10/15/23:  [place] UE/LE L3 resistance x 5' averages speed 69 SPM Toe tapping 6in step 2x 10 2 HHA then 1 with min A Discussed walking with AD, pt wanted to try walking stick 211ft gait training with cueing for sequence and speed, feel fall hazard than helpful 15x controlled STS slow no HHA  Sidestep 3RT front of counter RTB around thigh Hip flexor stretch 2x 30 on 12in step  10/13/23: Reviewed goals Educated importance of HEP Compliance 426ft no AD 4 stage balance assessment (see above)  DGI 1. Gait level surface (1) Moderate Impairment: Walks 20', slow speed, abnormal gait pattern, evidence for imbalance. 2. Change in gait speed (1) Moderate Impairment: Makes only minor adjustments to walking speed, or accomplishes a change in speed with significant gait deviations, or changes speed but has significant gait deviations, or changes speed but loses balance but is able to recover and continue walking. 3. Gait with horizontal head turns (2) Mild Impairment: Performs head turns smoothly with slight change in gait velocity, i.e., minor disruption to smooth gait path or uses walking aid. 4. Gait with vertical head turns 1) Moderate Impairment: Performs head turns with moderate change in gait velocity, slows down, staggers but recovers, can continue to walk. 5. Gait and pivot turn (1) Moderate Impairment: Turns slowly, requires verbal cueing, requires several small steps to catch balance following turn and stop. 6. Step over obstacle (1)  Moderate Impairment: Is able to step over box but must stop, then step over. May require verbal cueing. 7. Step around obstacles (2) Mild Impairment: Is able to step around both cones, but must slow down and adjust steps to clear cones. 8. Stairs (2) Mild Impairment: Alternating feet, must use rail.  TOTAL SCORE: 11 / 24  Sidestep 1RT step and pause x 12 ft cueing for posture, required HHA against wall  10/10/23: PT Eval and HEP   PATIENT EDUCATION:  Education details: PT evaluation, objective findings, POC, Importance of HEP, Precautions, Clinic policies, Anatomy and Physiology, Possible causes of imbalance Person educated: Patient Education method: Explanation and Demonstration Education comprehension: verbalized understanding and returned demonstration  HOME EXERCISE PROGRAM: Access Code: [email or web address] URL: [email or web address] Date: 10/10/2023 Prepared by: Rosaria Powell-Butler  Exercises - Supine Bridge  - 2 x daily - 7 x weekly - 2 sets - 10 reps - 5 hold - Sidelying Hip Abduction  - 2 x daily - 7 x weekly - 2 sets - 10 reps - Sit to Stand  - 2 x daily - 7 x weekly - 3 sets - 10 reps - Supine Transversus Abdominis Bracing - Hands on Stomach  - 2 x daily - 7 x weekly - 2 sets - 10 reps - 10 hold - Standing Tandem Balance with Counter Support  - 2 x daily - 7 x weekly - 3 sets - 30 hold  ASSESSMENT:  CLINICAL IMPRESSION: 10/15/23:  Pt unable or extremely difficult to stand in one place.  Session focus on controlled movements and slowing down cadence for balance training and proximal strengthening.  Discussed walking with AD, pt does not want to ambulate with RW currently, attempted with walking stick though feel increased fall hazard as difficult sequence and fast cadence.  Balance activities complete with min A and need for UE support.  Added hip flexor stretch and continued cueing through session for posture.  Pt easily fatigued requiring seated rest breaks between each  activity.    Eval:  Patient is a  78 y.o. female who was seen today for physical therapy evaluation and treatment for  gait disorder, imbalance  . On this date, patient demonstrates impaired self perception of balance via ABC scale, decreased LE strength (L>R), poor posture, dec endurance, and impaired balance, all of which may be contributing to patients decreased activity tolerance, impaired gait, and fall risk. Patient will benefit from continued skilled physical therapy in order to address these deficits/impairments to improve overall function and quality of life.    OBJECTIVE IMPAIRMENTS: Abnormal gait, decreased activity tolerance, decreased balance, decreased coordination, decreased endurance, decreased mobility, difficulty walking, decreased ROM, decreased strength, impaired flexibility, improper body mechanics, postural dysfunction, and pain.   ACTIVITY LIMITATIONS: carrying, lifting, bending, sitting, standing, squatting, stairs, transfers, and reach over head  PARTICIPATION LIMITATIONS: meal prep, cleaning, laundry, community activity, and yard work  PERSONAL FACTORS: Time since onset of injury/illness/exacerbation are also affecting patient's functional outcome.   REHAB POTENTIAL: Good  CLINICAL DECISION MAKING: Stable/uncomplicated  EVALUATION COMPLEXITY: Low   GOALS: Goals reviewed with patient? No  SHORT TERM GOALS: Target date: 10/31/23 Patient will be independent with performance of HEP to demonstrate adequate self management of symptoms.  Baseline:  Goal status: INITIAL  2.   Patient will report at least a 25% improvement with function and/or pain reduction overall since beginning PT. Baseline:  Goal status: INITIAL   LONG TERM GOALS: Target date: 11/21/23 Patient will improve ABC Scale score by 10% to demonstrate improved perceived function while meeting [facility].  Baseline: Goal status: INITIAL 2.  Patient will be able to statically stand at least 2 minutes  without UE support to demonstrate improved standing tolerance needed for community ambulation.  Baseline:  Goal status: INITIAL 3.  Patient will score at least a  4/5 on  LLE MMT to demonstrate increased LE strength and/or power needed to improve ambulation/gait mechanics.  Baseline:  Goal status: INITIAL   4.  Patient will increase 2 minute walk test gait distance to 30 ft with least restrictive assistive device to demonstrate improved endurance and functional mobility needed for ambulating household and community distances.  Baseline: Goal status: INITIAL  5. Patient will be able to maintain tandem balance bilaterally for at least 10 seconds without UE support to demonstrate improved balance needed to demonstrate decreased fall risk.  Baseline: Goal status: INITIAL   PLAN:  PT FREQUENCY: 2x/week  PT DURATION: 6 weeks  PLANNED INTERVENTIONS: [id number]- PT Re-evaluation, 97110-Therapeutic exercises, 97530- Therapeutic activity, [id number]- Neuromuscular re-education, 97535- Self Care, [id number]- Manual therapy, [id number]- Gait training, [phone]- Electrical stimulation (manual), [id number]- Traction (mechanical), [phone] (1-2 muscles), [id number] (3+ muscles)- Dry Needling, Patient/Family education, Balance training, Stair training, Taping, Joint mobilization, Spinal mobilization, Cryotherapy, and Moist heat  PLAN FOR NEXT SESSION: Posture, static balance then gait associated balance activities, LE strengthening, core strengthening.  Trial with metronome during gait, possible RW gait training.  Augustin Mclean, LPTA/CLT; CBIS [phone]  9:56 AM, 10/15/23

## 2023-10-28 ENCOUNTER — Encounter (HOSPITAL_COMMUNITY): Payer: Self-pay

## 2023-10-28 ENCOUNTER — Ambulatory Visit (HOSPITAL_COMMUNITY): Attending: Internal Medicine

## 2023-10-28 DIAGNOSIS — M6281 Muscle weakness (generalized): Secondary | ICD-10-CM | POA: Diagnosis present

## 2023-10-28 DIAGNOSIS — Z7409 Other reduced mobility: Secondary | ICD-10-CM | POA: Diagnosis present

## 2023-10-28 NOTE — Therapy (Signed)
 OUTPATIENT PHYSICAL THERAPY LOWER EXTREMITY TREATMENT   Patient Name: Emma Henderson MRN: [id number] DOB:10/24/1945, 78 y.o., female Today's Date: 10/28/2023  END OF SESSION:  PT End of Session - 10/28/23 0900     Visit Number 4    Number of Visits 13    Date for Recertification  11/07/23    Authorization Type [facility]    Authorization Time Period no auth    PT Start Time 0901    PT Stop Time [phone]    PT Time Calculation (min) 41 min    Equipment Utilized During Treatment --    Activity Tolerance Patient tolerated treatment well;Patient limited by fatigue    Behavior During Therapy [facility] for tasks assessed/performed           Past Medical History:  Diagnosis Date   Arthritis    hands (11/04/2012)   Cataract    Diverticulosis 2006   Gait abnormality 06/27/2020   Headache(784.0)    probably monthly 11/04/2012)   Osteopenia 05/2017   T score -1.8 FRAX 11.6% / 2.3%   Perforated diverticulitis 07/04/2012   Umbilical hernia 07/04/2012   Unsteady gait    Urinary incontinence    Past Surgical History:  Procedure Laterality Date   CATARACT EXTRACTION W/PHACO Left 12/11/2021   Procedure: CATARACT EXTRACTION PHACO AND INTRAOCULAR LENS PLACEMENT (IOC);  Surgeon: Juli Blunt, MD;  Location: [facility];  Service: Ophthalmology;  Laterality: Left;  CDE: 12.21   CATARACT EXTRACTION W/PHACO Right 12/28/2021   Procedure: CATARACT EXTRACTION PHACO AND INTRAOCULAR LENS PLACEMENT (IOC);  Surgeon: Juli Blunt, MD;  Location: [facility];  Service: Ophthalmology;  Laterality: Right;  CDE: 13.13   CERVICAL SPINE SURGERY  ~ 1997   COLON SURGERY     HERNIA REPAIR  11/04/2012   umbilical   LAPAROSCOPIC PARTIAL COLECTOMY N/A 11/04/2012   Procedure: LAPAROSCOPIC SIGMOID COLECTOMY;  Surgeon: Vicenta DELENA Poli, MD;  Location: [facility];  Service: General;  Laterality: N/A;   LAPAROSCOPIC SIGMOID COLECTOMY  11/04/2012   ABSCESS DIVERTICULAR      LUMBAR SPINE SURGERY  ~ 1987    PROCTOPLASTY N/A 11/04/2012   Procedure: RIGID PROCTOSCOPY;  Surgeon: Vicenta DELENA Poli, MD;  Location: [facility] OR;  Service: General;  Laterality: N/A;   Patient Active Problem List   Diagnosis Date Noted   Small vessel disease, cerebrovascular 01/04/2021   Gait abnormality 06/27/2020   Diverticulitis of intestine with perforation 07/04/2012   Osteopenia 07/04/2012   Umbilical hernia 07/04/2012    PCP: Sheryle Carwin, MD  REFERRING PROVIDER: Sheryle Carwin, MD  REFERRING DIAG:  gait disorder, imbalance    THERAPY DIAG:  Muscle weakness (generalized)  Impaired functional mobility, balance, gait, and endurance  Rationale for Evaluation and Treatment: Rehabilitation  ONSET DATE: Chronic, years  SUBJECTIVE:   SUBJECTIVE STATEMENT: Patient reports that she feels good today. She feels good walking, but has to lean on something to stand still.   Eval:  Patient reports she is here for her balance issues. Reports she has fallen once in last 6 months but she trips a lot and has some near misses. Reports she has some leg pain once standing in static position, LLE>RLE. Walking is fine as long as its not too far. She walks fast so she wont have to be on legs for long amount of time. Reports she developed scoliosis over time. Reports balance has been an ongoing issue but reports It has gotten worse recently  PERTINENT HISTORY: 2 previous spine surgeries: one in  cervical and low back, 1980s osteopenia  PAIN:  Are you having pain? No  PRECAUTIONS: None  RED FLAGS: None   WEIGHT BEARING RESTRICTIONS: No  FALLS:  Has patient fallen in last 6 months? Yes. Number of falls last fall June 19th, most falls due to tripping secondary to not picking up her feet  LIVING ENVIRONMENT: Lives with: lives alone Lives in: House/apartment Stairs: Yes: Internal: 12-13 steps; on left going up Has following equipment at home: Single point cane, does not use  OCCUPATION: Retired Runner, broadcasting/film/video.    PLOF:  Independent  PATIENT GOALS: I wish I could be more comfortable standing, and standing straighter  NEXT MD VISIT: Not set.   OBJECTIVE:  Note: Objective measures were completed at Evaluation unless otherwise noted.  DIAGNOSTIC FINDINGS: N/A  PATIENT SURVEYS:  ABC scale: The Activities-Specific Balance Confidence (ABC) Scale 0% 10 20 30  40 50 60 70 80 90 100% No confidence<->completely confident  "How confident are you that you will not lose your balance or become unsteady when you . . .   Total ABC score: 920 / 1600 = 57.5 %   COGNITION: Overall cognitive status: Within functional limits for tasks assessed     SENSATION: Light touch: Impaired  Dec sensation on L L1-2 dermatomes   POSTURE: rounded shoulders, forward head, flexed trunk , and laterally flexed to R   PALPATION:   LOWER EXTREMITY ROM:  Active ROM Right eval Left eval  Hip flexion    Hip extension    Hip abduction    Hip adduction    Hip internal rotation    Hip external rotation    Knee flexion    Knee extension    Ankle dorsiflexion    Ankle plantarflexion    Ankle inversion    Ankle eversion     (Blank rows = not tested)  LOWER EXTREMITY MMT:  MMT Right eval Left eval  Hip flexion 4 4-  Hip extension 4 4-  Hip abduction 4 4-  Hip adduction    Hip internal rotation    Hip external rotation    Knee flexion    Knee extension    Ankle dorsiflexion 5 5  Ankle plantarflexion    Ankle inversion    Ankle eversion     (Blank rows = not tested)  LOWER EXTREMITY SPECIAL TESTS:    FUNCTIONAL TESTS:  5 times sit to stand: 11 first trial, 8 second trial  Timed up and go (TUG): 9 no AD   2 minute walk test: 10/13/23: 483 ft no AD   10/13/23: Four Stage Balance:  Side by side: 30, BLE very shaky due to fatigue, 10/13/23: 11.06 prior need for UE support Semi tandem: 9 with RLE leading     7.53 With Lt LE leading Tandem: 25 with RLE leading, very shaky due to fatigue    3 Lt LE  leading SL:  Lt 3   Rt 4  GAIT: Distance walked: 75 ft in session  Assistive device utilized: None Level of assistance: Complete Independence Comments: slightly inc gait velocity, lateral lean to R due to scoliosis, dec stance time in LLE  TREATMENT DATE:                                    10/28/23 EXERCISE LOG  Exercise Repetitions and Resistance Comments  Nustep  L4 x 6 minutes   Sit to stand  10 reps w/ firm surface  20 reps w/ feet on foam pad    Static stance on airex  2 minutes  Wide BOS w/ frequent UE support from parallel bars   Standing donkey kicks  2 minutes  Alternating LE; BUE support from parallel bars   LAQ 4# x 20 reps each   Side stepping  2 x 1 minute Fingertip support from parallel bars  Standing heel raise  25 reps  BUE support from parallel bars   Steps  3 x 4 6 steps  2 railing progressing to 1 railing   Blank cell = exercise not performed today   10/15/23:  Nustep [place] UE/LE L3 resistance x 5' averages speed 69 SPM Toe tapping 6in step 2x 10 2 HHA then 1 with min A Discussed walking with AD, pt wanted to try walking stick 264ft gait training with cueing for sequence and speed, feel fall hazard than helpful 15x controlled STS slow no HHA  Sidestep 3RT front of counter RTB around thigh Hip flexor stretch 2x 30 on 12in step  10/13/23: Reviewed goals Educated importance of HEP Compliance 419ft no AD 4 stage balance assessment (see above)  DGI 1. Gait level surface (1) Moderate Impairment: Walks 20', slow speed, abnormal gait pattern, evidence for imbalance. 2. Change in gait speed (1) Moderate Impairment: Makes only minor adjustments to walking speed, or accomplishes a change in speed with significant gait deviations, or changes speed but has significant gait deviations, or changes speed but loses balance but is  able to recover and continue walking. 3. Gait with horizontal head turns (2) Mild Impairment: Performs head turns smoothly with slight change in gait velocity, i.e., minor disruption to smooth gait path or uses walking aid. 4. Gait with vertical head turns 1) Moderate Impairment: Performs head turns with moderate change in gait velocity, slows down, staggers but recovers, can continue to walk. 5. Gait and pivot turn (1) Moderate Impairment: Turns slowly, requires verbal cueing, requires several small steps to catch balance following turn and stop. 6. Step over obstacle (1) Moderate Impairment: Is able to step over box but must stop, then step over. May require verbal cueing. 7. Step around obstacles (2) Mild Impairment: Is able to step around both cones, but must slow down and adjust steps to clear cones. 8. Stairs (2) Mild Impairment: Alternating feet, must use rail.  TOTAL SCORE: 11 / 24  Sidestep 1RT step and pause x 12 ft cueing for posture, required HHA against wall  10/10/23: PT Eval and HEP   PATIENT EDUCATION:  Education details: progression Person educated: Patient Education method: Explanation Education comprehension: verbalized understanding  HOME EXERCISE PROGRAM: Access Code: [email or web address] URL: [email or web address] Date: 10/10/2023 Prepared by: Rosaria Powell-Butler  Exercises - Supine Bridge  - 2 x daily - 7 x weekly - 2 sets - 10 reps - 5 hold - Sidelying Hip Abduction  - 2 x daily - 7 x weekly - 2 sets - 10 reps - Sit to Stand  - 2 x daily - 7 x weekly - 3 sets - 10 reps - Supine Transversus Abdominis Bracing - Hands on Stomach  -  2 x daily - 7 x weekly - 2 sets - 10 reps - 10 hold - Standing Tandem Balance with Counter Support  - 2 x daily - 7 x weekly - 3 sets - 30 hold  ASSESSMENT:  CLINICAL IMPRESSION: Patient was progressed with multiple new interventions for improved lower extremity strength and stability. She required minimal cueing throughout  treatment for proper pacing to avoid a significant increase in fatigue. She required seated rest breaks throughout treatment due to fatigue. However, this did not limit her ability to complete today's active interventions. She reported feeling good upon the conclusion of treatment. Patient continues to require skilled physical therapy to address her remaining impairments to return to her prior level of function.    Eval:  Patient is a 78 y.o. female who was seen today for physical therapy evaluation and treatment for  gait disorder, imbalance  . On this date, patient demonstrates impaired self perception of balance via ABC scale, decreased LE strength (L>R), poor posture, dec endurance, and impaired balance, all of which may be contributing to patients decreased activity tolerance, impaired gait, and fall risk. Patient will benefit from continued skilled physical therapy in order to address these deficits/impairments to improve overall function and quality of life.    OBJECTIVE IMPAIRMENTS: Abnormal gait, decreased activity tolerance, decreased balance, decreased coordination, decreased endurance, decreased mobility, difficulty walking, decreased ROM, decreased strength, impaired flexibility, improper body mechanics, postural dysfunction, and pain.   ACTIVITY LIMITATIONS: carrying, lifting, bending, sitting, standing, squatting, stairs, transfers, and reach over head  PARTICIPATION LIMITATIONS: meal prep, cleaning, laundry, community activity, and yard work  PERSONAL FACTORS: Time since onset of injury/illness/exacerbation are also affecting patient's functional outcome.   REHAB POTENTIAL: Good  CLINICAL DECISION MAKING: Stable/uncomplicated  EVALUATION COMPLEXITY: Low   GOALS: Goals reviewed with patient? No  SHORT TERM GOALS: Target date: 10/31/23 Patient will be independent with performance of HEP to demonstrate adequate self management of symptoms.  Baseline:  Goal status:  INITIAL  2.   Patient will report at least a 25% improvement with function and/or pain reduction overall since beginning PT. Baseline:  Goal status: INITIAL   LONG TERM GOALS: Target date: 11/21/23 Patient will improve ABC Scale score by 10% to demonstrate improved perceived function while meeting [facility].  Baseline: Goal status: INITIAL 2.  Patient will be able to statically stand at least 2 minutes without UE support to demonstrate improved standing tolerance needed for community ambulation.  Baseline:  Goal status: INITIAL 3.  Patient will score at least a  4/5 on  LLE MMT to demonstrate increased LE strength and/or power needed to improve ambulation/gait mechanics.  Baseline:  Goal status: INITIAL   4.  Patient will increase 2 minute walk test gait distance to 30 ft with least restrictive assistive device to demonstrate improved endurance and functional mobility needed for ambulating household and community distances.  Baseline: Goal status: INITIAL  5. Patient will be able to maintain tandem balance bilaterally for at least 10 seconds without UE support to demonstrate improved balance needed to demonstrate decreased fall risk.  Baseline: Goal status: INITIAL   PLAN:  PT FREQUENCY: 2x/week  PT DURATION: 6 weeks  PLANNED INTERVENTIONS: [id number]- PT Re-evaluation, 97110-Therapeutic exercises, 97530- Therapeutic activity, [id number]- Neuromuscular re-education, 97535- Self Care, [id number]- Manual therapy, [id number]- Gait training, [phone]- Electrical stimulation (manual), [id number]- Traction (mechanical), [phone] (1-2 muscles), [id number] (3+ muscles)- Dry Needling, Patient/Family education, Balance training, Stair training, Taping, Joint mobilization, Spinal mobilization, Cryotherapy, and Moist heat  PLAN  FOR NEXT SESSION: Posture, static balance then gait associated balance activities, LE strengthening, core strengthening.  Trial with metronome during gait, possible RW gait training.   Lacinda Fass, PT,  DPT  9:51 AM, 10/28/23

## 2023-10-30 ENCOUNTER — Ambulatory Visit (HOSPITAL_COMMUNITY): Admitting: Physical Therapy

## 2023-10-30 DIAGNOSIS — Z7409 Other reduced mobility: Secondary | ICD-10-CM

## 2023-10-30 DIAGNOSIS — M6281 Muscle weakness (generalized): Secondary | ICD-10-CM

## 2023-10-30 NOTE — Therapy (Signed)
 OUTPATIENT PHYSICAL THERAPY LOWER EXTREMITY TREATMENT   Patient Name: Emma Henderson MRN: [id number] DOB:[birth year], 78 y.o., female Today's Date: 10/30/2023  END OF SESSION:  PT End of Session - 10/30/23 1218     Visit Number 5    Number of Visits 13    Date for Recertification  11/07/23    Authorization Type [facility]    Authorization Time Period no auth    PT Start Time 1033    PT Stop Time 1115    PT Time Calculation (min) 42 min    Activity Tolerance Patient tolerated treatment well;Patient limited by fatigue    Behavior During Therapy [facility] for tasks assessed/performed            Past Medical History:  Diagnosis Date   Arthritis    hands (11/04/2012)   Cataract    Diverticulosis 2006   Gait abnormality 06/27/2020   Headache(784.0)    probably monthly 11/04/2012)   Osteopenia 05/2017   T score -1.8 FRAX 11.6% / 2.3%   Perforated diverticulitis 07/04/2012   Umbilical hernia 07/04/2012   Unsteady gait    Urinary incontinence    Past Surgical History:  Procedure Laterality Date   CATARACT EXTRACTION W/PHACO Left 12/11/2021   Procedure: CATARACT EXTRACTION PHACO AND INTRAOCULAR LENS PLACEMENT (IOC);  Surgeon: Juli Blunt, MD;  Location: [facility];  Service: Ophthalmology;  Laterality: Left;  CDE: 12.21   CATARACT EXTRACTION W/PHACO Right 12/28/2021   Procedure: CATARACT EXTRACTION PHACO AND INTRAOCULAR LENS PLACEMENT (IOC);  Surgeon: Juli Blunt, MD;  Location: [facility];  Service: Ophthalmology;  Laterality: Right;  CDE: 13.13   CERVICAL SPINE SURGERY  ~ 1997   COLON SURGERY     HERNIA REPAIR  11/04/2012   umbilical   LAPAROSCOPIC PARTIAL COLECTOMY N/A 11/04/2012   Procedure: LAPAROSCOPIC SIGMOID COLECTOMY;  Surgeon: Vicenta DELENA Poli, MD;  Location: [facility];  Service: General;  Laterality: N/A;   LAPAROSCOPIC SIGMOID COLECTOMY  11/04/2012   ABSCESS DIVERTICULAR      LUMBAR SPINE SURGERY  ~ 1987   PROCTOPLASTY N/A 11/04/2012   Procedure: RIGID  PROCTOSCOPY;  Surgeon: Vicenta DELENA Poli, MD;  Location: [facility];  Service: General;  Laterality: N/A;   Patient Active Problem List   Diagnosis Date Noted   Small vessel disease, cerebrovascular 01/04/2021   Gait abnormality 06/27/2020   Diverticulitis of intestine with perforation 07/04/2012   Osteopenia 07/04/2012   Umbilical hernia 07/04/2012    PCP: Sheryle Carwin, MD  REFERRING PROVIDER: Sheryle Carwin, MD  REFERRING DIAG:  gait disorder, imbalance    THERAPY DIAG:  Muscle weakness (generalized)  Impaired functional mobility, balance, gait, and endurance  Rationale for Evaluation and Treatment: Rehabilitation  ONSET DATE: Chronic, years  SUBJECTIVE:   SUBJECTIVE STATEMENT: Patient reports no difficulty walking, just no standing endurance as her legs start shaking and wobbling.  Pt reports no pain or issues today.   Eval:  Patient reports she is here for her balance issues. Reports she has fallen once in last 6 months but she trips a lot and has some near misses. Reports she has some leg pain once standing in static position, LLE>RLE. Walking is fine as long as its not too far. She walks fast so she wont have to be on legs for long amount of time. Reports she developed scoliosis over time. Reports balance has been an ongoing issue but reports It has gotten worse recently  PERTINENT HISTORY: 2 previous spine surgeries: one in cervical and low  back, 1980s osteopenia  PAIN:  Are you having pain? No  PRECAUTIONS: None  RED FLAGS: None   WEIGHT BEARING RESTRICTIONS: No  FALLS:  Has patient fallen in last 6 months? Yes. Number of falls last fall June 19th, most falls due to tripping secondary to not picking up her feet  LIVING ENVIRONMENT: Lives with: lives alone Lives in: House/apartment Stairs: Yes: Internal: 12-13 steps; on left going up Has following equipment at home: Single point cane, does not use  OCCUPATION: Retired Runner, broadcasting/film/video.    PLOF: Independent  PATIENT  GOALS: I wish I could be more comfortable standing, and standing straighter  NEXT MD VISIT: Not set.   OBJECTIVE:  Note: Objective measures were completed at Evaluation unless otherwise noted.  DIAGNOSTIC FINDINGS: N/A  PATIENT SURVEYS:  ABC scale: The Activities-Specific Balance Confidence (ABC) Scale 0% 10 20 30  40 50 60 70 80 90 100% No confidence<->completely confident  "How confident are you that you will not lose your balance or become unsteady when you . . .   Total ABC score: 920 / 1600 = 57.5 %   COGNITION: Overall cognitive status: Within functional limits for tasks assessed     SENSATION: Light touch: Impaired  Dec sensation on L L1-2 dermatomes   POSTURE: rounded shoulders, forward head, flexed trunk , and laterally flexed to R   PALPATION:   LOWER EXTREMITY ROM:  Active ROM Right eval Left eval  Hip flexion    Hip extension    Hip abduction    Hip adduction    Hip internal rotation    Hip external rotation    Knee flexion    Knee extension    Ankle dorsiflexion    Ankle plantarflexion    Ankle inversion    Ankle eversion     (Blank rows = not tested)  LOWER EXTREMITY MMT:  MMT Right eval Left eval  Hip flexion 4 4-  Hip extension 4 4-  Hip abduction 4 4-  Hip adduction    Hip internal rotation    Hip external rotation    Knee flexion    Knee extension    Ankle dorsiflexion 5 5  Ankle plantarflexion    Ankle inversion    Ankle eversion     (Blank rows = not tested)  LOWER EXTREMITY SPECIAL TESTS:    FUNCTIONAL TESTS:  5 times sit to stand: 11 first trial, 8 second trial  Timed up and go (TUG): 9 no AD   2 minute walk test: 10/13/23: 483 ft no AD   10/13/23: Four Stage Balance:  Side by side: 30, BLE very shaky due to fatigue, 10/13/23: 11.06 prior need for UE support Semi tandem: 9 with RLE leading     7.53 With Lt LE leading Tandem: 25 with RLE leading, very shaky due to fatigue    3 Lt LE leading SL:  Lt 3   Rt  4  GAIT: Distance walked: 75 ft in session  Assistive device utilized: None Level of assistance: Complete Independence Comments: slightly inc gait velocity, lateral lean to R due to scoliosis, dec stance time in LLE  TREATMENT DATE:  10/30/23:  Nustep [place] UE/LE L3 resistance x 5'  Metronome at 70 bpm 1 RT around facility (226 feet) Forward lunges onto BOSU dome up 2X10 1-2 UE assist Toe tapping 6in step 2x 10  1 UE assist, slow and controlled                                   10/28/23 EXERCISE LOG  Exercise Repetitions and Resistance Comments  Nustep  L4 x 6 minutes   Sit to stand  10 reps w/ firm surface  20 reps w/ feet on foam pad    Static stance on airex  2 minutes  Wide BOS w/ frequent UE support from parallel bars   Standing donkey kicks  2 minutes  Alternating LE; BUE support from parallel bars   LAQ 4# x 20 reps each   Side stepping  2 x 1 minute Fingertip support from parallel bars  Standing heel raise  25 reps  BUE support from parallel bars   Steps  3 x 4 6 steps  2 railing progressing to 1 railing   Blank cell = exercise not performed today   10/15/23:  Nustep [place] UE/LE L3 resistance x 5' averages speed 69 SPM Toe tapping 6in step 2x 10 2 HHA then 1 with min A Discussed walking with AD, pt wanted to try walking stick 277ft gait training with cueing for sequence and speed, feel fall hazard than helpful 15x controlled STS slow no HHA  Sidestep 3RT front of counter RTB around thigh Hip flexor stretch 2x 30 on 12in step  10/13/23: Reviewed goals Educated importance of HEP Compliance 432ft no AD 4 stage balance assessment (see above)  DGI 1. Gait level surface (1) Moderate Impairment: Walks 20', slow speed, abnormal gait pattern, evidence for imbalance. 2. Change in gait speed (1) Moderate Impairment: Makes  only minor adjustments to walking speed, or accomplishes a change in speed with significant gait deviations, or changes speed but has significant gait deviations, or changes speed but loses balance but is able to recover and continue walking. 3. Gait with horizontal head turns (2) Mild Impairment: Performs head turns smoothly with slight change in gait velocity, i.e., minor disruption to smooth gait path or uses walking aid. 4. Gait with vertical head turns 1) Moderate Impairment: Performs head turns with moderate change in gait velocity, slows down, staggers but recovers, can continue to walk. 5. Gait and pivot turn (1) Moderate Impairment: Turns slowly, requires verbal cueing, requires several small steps to catch balance following turn and stop. 6. Step over obstacle (1) Moderate Impairment: Is able to step over box but must stop, then step over. May require verbal cueing. 7. Step around obstacles (2) Mild Impairment: Is able to step around both cones, but must slow down and adjust steps to clear cones. 8. Stairs (2) Mild Impairment: Alternating feet, must use rail.  TOTAL SCORE: 11 / 24  Sidestep 1RT step and pause x 12 ft cueing for posture, required HHA against wall  10/10/23: PT Eval and HEP   PATIENT EDUCATION:  Education details: progression Person educated: Patient Education method: Explanation Education comprehension: verbalized understanding  HOME EXERCISE PROGRAM: Access Code: [email or web address] URL: [email or web address] Date: 10/10/2023 Prepared by: Rosaria Powell-Butler  Exercises - Supine Bridge  - 2 x daily - 7 x weekly - 2 sets - 10 reps - 5 hold - Sidelying Hip Abduction  -  2 x daily - 7 x weekly - 2 sets - 10 reps - Sit to Stand  - 2 x daily - 7 x weekly - 3 sets - 10 reps - Supine Transversus Abdominis Bracing - Hands on Stomach  - 2 x daily - 7 x weekly - 2 sets - 10 reps - 10 hold - Standing Tandem Balance with Counter Support  - 2 x daily - 7 x weekly -  3 sets - 30 hold  ASSESSMENT:  CLINICAL IMPRESSION: Began with nustep warm up.  Attempted resisted walk outs, however unable to complete even with no resistance as required HHA.  Completed ambulation with metronome to maintain steppage.  Pt with general impulsivity and safety concerns throughout session as tendst o reach/grab for items, progressively forward bent posturing as fatigues and many rest breaks throughout session.  Educated on slowing down her gait with more focus on her posture. PT reported increased fatigue following this as tends to work off momentum.  Max cues throughout for slowing movements, upright posturing. Patient continues to require skilled physical therapy to address her remaining impairments to return to her prior level of function.    Eval:  Patient is a 78 y.o. female who was seen today for physical therapy evaluation and treatment for  gait disorder, imbalance  . On this date, patient demonstrates impaired self perception of balance via ABC scale, decreased LE strength (L>R), poor posture, dec endurance, and impaired balance, all of which may be contributing to patients decreased activity tolerance, impaired gait, and fall risk. Patient will benefit from continued skilled physical therapy in order to address these deficits/impairments to improve overall function and quality of life.    OBJECTIVE IMPAIRMENTS: Abnormal gait, decreased activity tolerance, decreased balance, decreased coordination, decreased endurance, decreased mobility, difficulty walking, decreased ROM, decreased strength, impaired flexibility, improper body mechanics, postural dysfunction, and pain.   ACTIVITY LIMITATIONS: carrying, lifting, bending, sitting, standing, squatting, stairs, transfers, and reach over head  PARTICIPATION LIMITATIONS: meal prep, cleaning, laundry, community activity, and yard work  PERSONAL FACTORS: Time since onset of injury/illness/exacerbation are also affecting patient's  functional outcome.   REHAB POTENTIAL: Good  CLINICAL DECISION MAKING: Stable/uncomplicated  EVALUATION COMPLEXITY: Low   GOALS: Goals reviewed with patient? No  SHORT TERM GOALS: Target date: 10/31/23 Patient will be independent with performance of HEP to demonstrate adequate self management of symptoms.  Baseline:  Goal status: INITIAL  2.   Patient will report at least a 25% improvement with function and/or pain reduction overall since beginning PT. Baseline:  Goal status: INITIAL   LONG TERM GOALS: Target date: 11/21/23 Patient will improve ABC Scale score by 10% to demonstrate improved perceived function while meeting [facility].  Baseline: Goal status: INITIAL 2.  Patient will be able to statically stand at least 2 minutes without UE support to demonstrate improved standing tolerance needed for community ambulation.  Baseline:  Goal status: INITIAL 3.  Patient will score at least a  4/5 on  LLE MMT to demonstrate increased LE strength and/or power needed to improve ambulation/gait mechanics.  Baseline:  Goal status: INITIAL   4.  Patient will increase 2 minute walk test gait distance to 30 ft with least restrictive assistive device to demonstrate improved endurance and functional mobility needed for ambulating household and community distances.  Baseline: Goal status: INITIAL  5. Patient will be able to maintain tandem balance bilaterally for at least 10 seconds without UE support to demonstrate improved balance needed to demonstrate decreased fall  risk.  Baseline: Goal status: INITIAL   PLAN:  PT FREQUENCY: 2x/week  PT DURATION: 6 weeks  PLANNED INTERVENTIONS: 97164- PT Re-evaluation, 97110-Therapeutic exercises, 97530- Therapeutic activity, [id number]- Neuromuscular re-education, 97535- Self Care, [id number]- Manual therapy, [id number]- Gait training, [phone]- Electrical stimulation (manual), [id number]- Traction (mechanical), 20560 (1-2 muscles), 20561 (3+ muscles)- Dry Needling,  Patient/Family education, Balance training, Stair training, Taping, Joint mobilization, Spinal mobilization, Cryotherapy, and Moist heat  PLAN FOR NEXT SESSION: continue to progress LE and Postural strength, static and dynamic balance, activity tolerance.     Greig KATHEE Fuse, PTA/CLT [facility] Ph: [phone] 12:19 PM, 10/30/23

## 2023-11-05 ENCOUNTER — Ambulatory Visit (HOSPITAL_COMMUNITY): Admitting: Physical Therapy

## 2023-11-05 DIAGNOSIS — M6281 Muscle weakness (generalized): Secondary | ICD-10-CM

## 2023-11-05 DIAGNOSIS — Z7409 Other reduced mobility: Secondary | ICD-10-CM

## 2023-11-05 NOTE — Therapy (Signed)
 OUTPATIENT PHYSICAL THERAPY LOWER EXTREMITY TREATMENT   Patient Name: Emma Henderson MRN: [id number] DOB:[birth year], 78 y.o., female Today's Date: 11/05/2023  END OF SESSION:  PT End of Session - 11/05/23 1326     Visit Number 6    Number of Visits 13    Date for Recertification  11/07/23    Authorization Type [facility]    Authorization Time Period no auth    PT Start Time 1248    PT Stop Time 1326    PT Time Calculation (min) 38 min    Activity Tolerance Patient tolerated treatment well;Patient limited by fatigue    Behavior During Therapy [facility] for tasks assessed/performed             Past Medical History:  Diagnosis Date   Arthritis    hands (11/04/2012)   Cataract    Diverticulosis 2006   Gait abnormality 06/27/2020   Headache(784.0)    probably monthly 11/04/2012)   Osteopenia 05/2017   T score -1.8 FRAX 11.6% / 2.3%   Perforated diverticulitis 07/04/2012   Umbilical hernia 07/04/2012   Unsteady gait    Urinary incontinence    Past Surgical History:  Procedure Laterality Date   CATARACT EXTRACTION W/PHACO Left 12/11/2021   Procedure: CATARACT EXTRACTION PHACO AND INTRAOCULAR LENS PLACEMENT (IOC);  Surgeon: Juli Blunt, MD;  Location: [facility];  Service: Ophthalmology;  Laterality: Left;  CDE: 12.21   CATARACT EXTRACTION W/PHACO Right 12/28/2021   Procedure: CATARACT EXTRACTION PHACO AND INTRAOCULAR LENS PLACEMENT (IOC);  Surgeon: Juli Blunt, MD;  Location: [facility];  Service: Ophthalmology;  Laterality: Right;  CDE: 13.13   CERVICAL SPINE SURGERY  ~ 1997   COLON SURGERY     HERNIA REPAIR  11/04/2012   umbilical   LAPAROSCOPIC PARTIAL COLECTOMY N/A 11/04/2012   Procedure: LAPAROSCOPIC SIGMOID COLECTOMY;  Surgeon: Vicenta DELENA Poli, MD;  Location: [facility];  Service: General;  Laterality: N/A;   LAPAROSCOPIC SIGMOID COLECTOMY  11/04/2012   ABSCESS DIVERTICULAR      LUMBAR SPINE SURGERY  ~ 1987   PROCTOPLASTY N/A 11/04/2012   Procedure: RIGID  PROCTOSCOPY;  Surgeon: Vicenta DELENA Poli, MD;  Location: [facility];  Service: General;  Laterality: N/A;   Patient Active Problem List   Diagnosis Date Noted   Small vessel disease, cerebrovascular 01/04/2021   Gait abnormality 06/27/2020   Diverticulitis of intestine with perforation 07/04/2012   Osteopenia 07/04/2012   Umbilical hernia 07/04/2012    PCP: Sheryle Carwin, MD  REFERRING PROVIDER: Sheryle Carwin, MD  REFERRING DIAG:  gait disorder, imbalance    THERAPY DIAG:  Muscle weakness (generalized)  Impaired functional mobility, balance, gait, and endurance  Rationale for Evaluation and Treatment: Rehabilitation  ONSET DATE: Chronic, years  SUBJECTIVE:   SUBJECTIVE STATEMENT: Patient reports she is thankful for the education on her walking as she feels it is helping.  Feels more steady overall and that it is doing some good. No pain reported today.  Eval:  Patient reports she is here for her balance issues. Reports she has fallen once in last 6 months but she trips a lot and has some near misses. Reports she has some leg pain once standing in static position, LLE>RLE. Walking is fine as long as its not too far. She walks fast so she wont have to be on legs for long amount of time. Reports she developed scoliosis over time. Reports balance has been an ongoing issue but reports It has gotten worse recently  PERTINENT HISTORY:  2 previous spine surgeries: one in cervical and low back, 1980s osteopenia  PAIN:  Are you having pain? No  PRECAUTIONS: None  RED FLAGS: None   WEIGHT BEARING RESTRICTIONS: No  FALLS:  Has patient fallen in last 6 months? Yes. Number of falls last fall June 19th, most falls due to tripping secondary to not picking up her feet  LIVING ENVIRONMENT: Lives with: lives alone Lives in: House/apartment Stairs: Yes: Internal: 12-13 steps; on left going up Has following equipment at home: Single point cane, does not use  OCCUPATION: Retired Runner, broadcasting/film/video.     PLOF: Independent  PATIENT GOALS: I wish I could be more comfortable standing, and standing straighter  NEXT MD VISIT: Not set.   OBJECTIVE:  Note: Objective measures were completed at Evaluation unless otherwise noted.  DIAGNOSTIC FINDINGS: N/A  PATIENT SURVEYS:  ABC scale: The Activities-Specific Balance Confidence (ABC) Scale 0% 10 20 30  40 50 60 70 80 90 100% No confidence<->completely confident  "How confident are you that you will not lose your balance or become unsteady when you . . .   Total ABC score: 920 / 1600 = 57.5 %   COGNITION: Overall cognitive status: Within functional limits for tasks assessed     SENSATION: Light touch: Impaired  Dec sensation on L L1-2 dermatomes   POSTURE: rounded shoulders, forward head, flexed trunk , and laterally flexed to R   PALPATION:   LOWER EXTREMITY ROM:  Active ROM Right eval Left eval  Hip flexion    Hip extension    Hip abduction    Hip adduction    Hip internal rotation    Hip external rotation    Knee flexion    Knee extension    Ankle dorsiflexion    Ankle plantarflexion    Ankle inversion    Ankle eversion     (Blank rows = not tested)  LOWER EXTREMITY MMT:  MMT Right eval Left eval  Hip flexion 4 4-  Hip extension 4 4-  Hip abduction 4 4-  Hip adduction    Hip internal rotation    Hip external rotation    Knee flexion    Knee extension    Ankle dorsiflexion 5 5  Ankle plantarflexion    Ankle inversion    Ankle eversion     (Blank rows = not tested)  LOWER EXTREMITY SPECIAL TESTS:    FUNCTIONAL TESTS:  5 times sit to stand: 11 first trial, 8 second trial  Timed up and go (TUG): 9 no AD   2 minute walk test: 10/13/23: 483 ft no AD   10/13/23: Four Stage Balance:  Side by side: 30, BLE very shaky due to fatigue, 10/13/23: 11.06 prior need for UE support Semi tandem: 9 with RLE leading     7.53 With Lt LE leading Tandem: 25 with RLE leading, very shaky due to  fatigue    3 Lt LE leading SL:  Lt 3   Rt 4  GAIT: Distance walked: 75 ft in session  Assistive device utilized: None Level of assistance: Complete Independence Comments: slightly inc gait velocity, lateral lean to R due to scoliosis, dec stance time in LLE  TREATMENT DATE:  11/05/23:  [place] UE/LE L4 resistance x 5'  Standing:  Forward lunges onto BOSU dome up 2X10 1-2 UE assist Toe tapping 6in step 2x 10  1 UE assist-2 when fatigued, slow and controlled Forward step ups 6 2X10 each with bil UE assist Lateral step downs, eccentric lowering Ambulation working in increasing stride, upright posturing   10/30/23:  [place] UE/LE L3 resistance x 5'  Metronome at 70 bpm 1 RT around facility (226 feet) Forward lunges onto BOSU dome up 2X10 1-2 UE assist Toe tapping 6in step 2x 10  1 UE assist, slow and controlled                                   10/28/23 EXERCISE LOG  Exercise Repetitions and Resistance Comments  Nustep  L4 x 6 minutes   Sit to stand  10 reps w/ firm surface  20 reps w/ feet on foam pad    Static stance on airex  2 minutes  Wide BOS w/ frequent UE support from parallel bars   Standing donkey kicks  2 minutes  Alternating LE; BUE support from parallel bars   LAQ 4# x 20 reps each   Side stepping  2 x 1 minute Fingertip support from parallel bars  Standing heel raise  25 reps  BUE support from parallel bars   Steps  3 x 4 6 steps  2 railing progressing to 1 railing   Blank cell = exercise not performed today   10/15/23:  [place] UE/LE L3 resistance x 5' averages speed 69 SPM Toe tapping 6in step 2x 10 2 HHA then 1 with min A Discussed walking with AD, pt wanted to try walking stick 254ft gait training with cueing for sequence and speed, feel fall hazard than helpful 15x controlled STS slow no  HHA  Sidestep 3RT front of counter RTB around thigh Hip flexor stretch 2x 30 on 12in step  10/13/23: Reviewed goals Educated importance of HEP Compliance 450ft no AD 4 stage balance assessment (see above)  DGI 1. Gait level surface (1) Moderate Impairment: Walks 20', slow speed, abnormal gait pattern, evidence for imbalance. 2. Change in gait speed (1) Moderate Impairment: Makes only minor adjustments to walking speed, or accomplishes a change in speed with significant gait deviations, or changes speed but has significant gait deviations, or changes speed but loses balance but is able to recover and continue walking. 3. Gait with horizontal head turns (2) Mild Impairment: Performs head turns smoothly with slight change in gait velocity, i.e., minor disruption to smooth gait path or uses walking aid. 4. Gait with vertical head turns 1) Moderate Impairment: Performs head turns with moderate change in gait velocity, slows down, staggers but recovers, can continue to walk. 5. Gait and pivot turn (1) Moderate Impairment: Turns slowly, requires verbal cueing, requires several small steps to catch balance following turn and stop. 6. Step over obstacle (1) Moderate Impairment: Is able to step over box but must stop, then step over. May require verbal cueing. 7. Step around obstacles (2) Mild Impairment: Is able to step around both cones, but must slow down and adjust steps to clear cones. 8. Stairs (2) Mild Impairment: Alternating feet, must use rail.  TOTAL SCORE: 11 / 24  Sidestep 1RT step and pause x 12 ft cueing for posture, required HHA against wall  10/10/23: PT Eval  and HEP   PATIENT EDUCATION:  Education details: progression Person educated: Patient Education method: Explanation Education comprehension: verbalized understanding  HOME EXERCISE PROGRAM: Access Code: [email or web address] URL: [email or web address] Date: 10/10/2023 Prepared by: Rosaria  Powell-Butler  Exercises - Supine Bridge  - 2 x daily - 7 x weekly - 2 sets - 10 reps - 5 hold - Sidelying Hip Abduction  - 2 x daily - 7 x weekly - 2 sets - 10 reps - Sit to Stand  - 2 x daily - 7 x weekly - 3 sets - 10 reps - Supine Transversus Abdominis Bracing - Hands on Stomach  - 2 x daily - 7 x weekly - 2 sets - 10 reps - 10 hold - Standing Tandem Balance with Counter Support  - 2 x daily - 7 x weekly - 3 sets - 30 hold  ASSESSMENT:  CLINICAL IMPRESSION: Upon entrance, Pt with much improved gait quality as noted with larger stride, slower speed and upright posturing.  Continued with standing strengthening exercises today with encouragement to reduce UE assist as able. Can usually complete 3-4 reps with 1 UE before needing to use both.  Cues needed throughout to correct forward bent posturing as fatigues and continues to require many rest breaks throughout session.  Pt had to leave a few minutes early today for a lunch meeting.  Patient continues to require skilled physical therapy to address her remaining impairments to return to her prior level of function.    Eval:  Patient is a 78 y.o. female who was seen today for physical therapy evaluation and treatment for  gait disorder, imbalance  . On this date, patient demonstrates impaired self perception of balance via ABC scale, decreased LE strength (L>R), poor posture, dec endurance, and impaired balance, all of which may be contributing to patients decreased activity tolerance, impaired gait, and fall risk. Patient will benefit from continued skilled physical therapy in order to address these deficits/impairments to improve overall function and quality of life.    OBJECTIVE IMPAIRMENTS: Abnormal gait, decreased activity tolerance, decreased balance, decreased coordination, decreased endurance, decreased mobility, difficulty walking, decreased ROM, decreased strength, impaired flexibility, improper body mechanics, postural dysfunction, and  pain.   ACTIVITY LIMITATIONS: carrying, lifting, bending, sitting, standing, squatting, stairs, transfers, and reach over head  PARTICIPATION LIMITATIONS: meal prep, cleaning, laundry, community activity, and yard work  PERSONAL FACTORS: Time since onset of injury/illness/exacerbation are also affecting patient's functional outcome.   REHAB POTENTIAL: Good  CLINICAL DECISION MAKING: Stable/uncomplicated  EVALUATION COMPLEXITY: Low   GOALS: Goals reviewed with patient? No  SHORT TERM GOALS: Target date: 10/31/23 Patient will be independent with performance of HEP to demonstrate adequate self management of symptoms.  Baseline:  Goal status: INITIAL  2.   Patient will report at least a 25% improvement with function and/or pain reduction overall since beginning PT. Baseline:  Goal status: INITIAL   LONG TERM GOALS: Target date: 11/21/23 Patient will improve ABC Scale score by 10% to demonstrate improved perceived function while meeting [facility].  Baseline: Goal status: INITIAL 2.  Patient will be able to statically stand at least 2 minutes without UE support to demonstrate improved standing tolerance needed for community ambulation.  Baseline:  Goal status: INITIAL 3.  Patient will score at least a  4/5 on  LLE MMT to demonstrate increased LE strength and/or power needed to improve ambulation/gait mechanics.  Baseline:  Goal status: INITIAL   4.  Patient will increase 2 minute walk test gait distance to  30 ft with least restrictive assistive device to demonstrate improved endurance and functional mobility needed for ambulating household and community distances.  Baseline: Goal status: INITIAL  5. Patient will be able to maintain tandem balance bilaterally for at least 10 seconds without UE support to demonstrate improved balance needed to demonstrate decreased fall risk.  Baseline: Goal status: INITIAL   PLAN:  PT FREQUENCY: 2x/week  PT DURATION: 6 weeks  PLANNED  INTERVENTIONS: 97164- PT Re-evaluation, 97110-Therapeutic exercises, 97530- Therapeutic activity, [id number]- Neuromuscular re-education, 97535- Self Care, [id number]- Manual therapy, [id number]- Gait training, [phone]- Electrical stimulation (manual), [id number]- Traction (mechanical), [phone] (1-2 muscles), [id number] (3+ muscles)- Dry Needling, Patient/Family education, Balance training, Stair training, Taping, Joint mobilization, Spinal mobilization, Cryotherapy, and Moist heat  PLAN FOR NEXT SESSION: continue to progress LE and Postural strength, static and dynamic balance, activity tolerance.     Greig KATHEE Fuse, PTA/CLT [facility] Ph: [phone] 1:29 PM, 11/05/23

## 2023-11-11 ENCOUNTER — Ambulatory Visit (HOSPITAL_COMMUNITY): Admitting: Physical Therapy

## 2023-11-11 DIAGNOSIS — Z7409 Other reduced mobility: Secondary | ICD-10-CM

## 2023-11-11 DIAGNOSIS — M6281 Muscle weakness (generalized): Secondary | ICD-10-CM | POA: Diagnosis not present

## 2023-11-11 NOTE — Therapy (Addendum)
 OUTPATIENT PHYSICAL THERAPY LOWER EXTREMITY TREATMENT Progress Note/Recert Reporting Period 10/10/23 to 11/11/23  See note below for Objective Data and Assessment of Progress/Goals.       Patient Name: Emma Henderson MRN: [id number] DOB:1945-05-10, 78 y.o., female Today's Date: 11/11/2023  END OF SESSION:  PT End of Session - 11/11/23 1034     Visit Number 7    Number of Visits 13    Date for Recertification  12/16/23    Authorization Type [facility]    Authorization Time Period no auth    PT Start Time 1033    PT Stop Time 1118    PT Time Calculation (min) 45 min    Activity Tolerance Patient tolerated treatment well;Patient limited by fatigue    Behavior During Therapy [facility] for tasks assessed/performed              Past Medical History:  Diagnosis Date   Arthritis    hands (11/04/2012)   Cataract    Diverticulosis 2006   Gait abnormality 06/27/2020   Headache(784.0)    probably monthly 11/04/2012)   Osteopenia 05/2017   T score -1.8 FRAX 11.6% / 2.3%   Perforated diverticulitis 07/04/2012   Umbilical hernia 07/04/2012   Unsteady gait    Urinary incontinence    Past Surgical History:  Procedure Laterality Date   CATARACT EXTRACTION W/PHACO Left 12/11/2021   Procedure: CATARACT EXTRACTION PHACO AND INTRAOCULAR LENS PLACEMENT (IOC);  Surgeon: Juli Blunt, MD;  Location: [facility];  Service: Ophthalmology;  Laterality: Left;  CDE: 12.21   CATARACT EXTRACTION W/PHACO Right 12/28/2021   Procedure: CATARACT EXTRACTION PHACO AND INTRAOCULAR LENS PLACEMENT (IOC);  Surgeon: Juli Blunt, MD;  Location: [facility];  Service: Ophthalmology;  Laterality: Right;  CDE: 13.13   CERVICAL SPINE SURGERY  ~ 1997   COLON SURGERY     HERNIA REPAIR  11/04/2012   umbilical   LAPAROSCOPIC PARTIAL COLECTOMY N/A 11/04/2012   Procedure: LAPAROSCOPIC SIGMOID COLECTOMY;  Surgeon: Vicenta DELENA Poli, MD;  Location: [facility];  Service: General;  Laterality: N/A;   LAPAROSCOPIC  SIGMOID COLECTOMY  11/04/2012   ABSCESS DIVERTICULAR      LUMBAR SPINE SURGERY  ~ 1987   PROCTOPLASTY N/A 11/04/2012   Procedure: RIGID PROCTOSCOPY;  Surgeon: Vicenta DELENA Poli, MD;  Location: [facility];  Service: General;  Laterality: N/A;   Patient Active Problem List   Diagnosis Date Noted   Small vessel disease, cerebrovascular 01/04/2021   Gait abnormality 06/27/2020   Diverticulitis of intestine with perforation 07/04/2012   Osteopenia 07/04/2012   Umbilical hernia 07/04/2012    PCP: Sheryle Carwin, MD  REFERRING PROVIDER: Sheryle Carwin, MD  REFERRING DIAG:  gait disorder, imbalance    THERAPY DIAG:  No diagnosis found.  Rationale for Evaluation and Treatment: Rehabilitation  ONSET DATE: Chronic, years  SUBJECTIVE:   SUBJECTIVE STATEMENT: Patient reports she is overall 10% improved. Reports her major setback is her balance.  States her friends have noticed improvement in her walking/steadiness it's when she's standing still that she is most unstable. No pain reported today.  Eval:  Patient reports she is here for her balance issues. Reports she has fallen once in last 6 months but she trips a lot and has some near misses. Reports she has some leg pain once standing in static position, LLE>RLE. Walking is fine as long as its not too far. She walks fast so she wont have to be on legs for long amount of time. Reports she developed  scoliosis over time. Reports balance has been an ongoing issue but reports It has gotten worse recently  PERTINENT HISTORY: 2 previous spine surgeries: one in cervical and low back, 1980s osteopenia  PAIN:  Are you having pain? No  PRECAUTIONS: None  RED FLAGS: None   WEIGHT BEARING RESTRICTIONS: No  FALLS:  Has patient fallen in last 6 months? Yes. Number of falls last fall June 19th, most falls due to tripping secondary to not picking up her feet  LIVING ENVIRONMENT: Lives with: lives alone Lives in: House/apartment Stairs: Yes: Internal:  12-13 steps; on left going up Has following equipment at home: Single point cane, does not use  OCCUPATION: Retired Runner, broadcasting/film/video.    PLOF: Independent  PATIENT GOALS: I wish I could be more comfortable standing, and standing straighter  NEXT MD VISIT: Not set.   OBJECTIVE:  Note: Objective measures were completed at Evaluation unless otherwise noted.  DIAGNOSTIC FINDINGS: N/A  PATIENT SURVEYS:  ABC scale: The Activities-Specific Balance Confidence (ABC) Scale 0% 10 20 30  40 50 60 70 80 90 100% No confidence<->completely confident  "How confident are you that you will not lose your balance or become unsteady when you . . .   Total ABC score: evaluation: 920 / 1600 = 57.5 % 11/11/23:  980 / 1600 = 61.3 %  COGNITION: Overall cognitive status: Within functional limits for tasks assessed     SENSATION: Light touch: Impaired  Dec sensation on L L1-2 dermatomes   POSTURE: rounded shoulders, forward head, flexed trunk , and laterally flexed to R   PALPATION:   LOWER EXTREMITY ROM:  Active ROM Right eval Left eval  Hip flexion    Hip extension    Hip abduction    Hip adduction    Hip internal rotation    Hip external rotation    Knee flexion    Knee extension    Ankle dorsiflexion    Ankle plantarflexion    Ankle inversion    Ankle eversion     (Blank rows = not tested)  LOWER EXTREMITY MMT:  MMT Right eval Left eval  Hip flexion 4 4-  Hip extension 4 4-  Hip abduction 4 4-  Hip adduction    Hip internal rotation    Hip external rotation    Knee flexion    Knee extension    Ankle dorsiflexion 5 5  Ankle plantarflexion    Ankle inversion    Ankle eversion     (Blank rows = not tested)  LOWER EXTREMITY SPECIAL TESTS:    FUNCTIONAL TESTS:  5 times sit to stand: 11 first trial, 8 second trial  Timed up and go (TUG): 9 no AD   2 minute walk test: 10/13/23: 483 ft no AD   10/13/23: Four Stage Balance:  Side by side: 30, BLE very shaky due to  fatigue, 10/13/23: 11.06 prior need for UE support Semi tandem: 9 with RLE leading     7.53 With Lt LE leading Tandem: 25 with RLE leading, very shaky due to fatigue    3 Lt LE leading SL:  Lt 3   Rt 4  GAIT: Distance walked: 75 ft in session  Assistive device utilized: None Level of assistance: Complete Independence Comments: slightly inc gait velocity, lateral lean to R due to scoliosis, dec stance time in LLE  TREATMENT DATE:  11/11/23  progress note 5 times sit to stand: 8.7 no UE (was  11 first trial, 8 second trial)  Timed up and go (TUG): 7.46 (was 9 no AD)   2 minute walk test: 10/13/23: 452 feet with larger stride, improved posturing, safer gait no AD (was 483 ft no AD)  Four Stage Balance:  Feet together 23 very shaky due to fatigue (10/13/23: 11.06 prior need for UE support)  tandem: 7.11 (was 9) with RLE leading      11.8 with forward leaning (was 7.53) With Lt LE leading Single leg stance:  Lt: 6 Rt:8  (was Lt 3  Rt 4) Standing:  vectors 5X3 each side with 1 UE assist  ABC confidence:  980 / 1600 = 61.3 % (was 920 / 1600 = 57.5 %) DGI 1. Gait level surface (3) Normal: Walks 20', no assistive devices, good sped, no evidence for imbalance, normal gait pattern 2. Change in gait speed (3) Normal: Able to smoothly change walking speed without loss of balance or gait deviation. Shows a significant difference in walking speeds between normal, fast and slow speeds. 3. Gait with horizontal head turns (3) Normal: Performs head turns smoothly with no change in gait. 4. Gait with vertical head turns (3) Normal: Performs head turns smoothly with no change in gait. 5. Gait and pivot turn (3) Normal: Pivot turns safely within 3 seconds and stops quickly with no loss of balance. 6. Step over obstacle (3) Normal: Is able to step over the  box without changing gait speed, no evidence of imbalance. 7. Step around obstacles (3) Normal: Is able to walk around cones safely without changing gait speed; no evidence of imbalance. 8. Stairs (2) Mild Impairment: Alternating feet, must use rail.  TOTAL SCORE: 23 / 24   11/05/23:  [place] UE/LE L4 resistance x 5'  Standing:  Forward lunges onto BOSU dome up 2X10 1-2 UE assist Toe tapping 6in step 2x 10  1 UE assist-2 when fatigued, slow and controlled Forward step ups 6 2X10 each with bil UE assist Lateral step downs, eccentric lowering Ambulation working in increasing stride, upright posturing   10/30/23:  [place] UE/LE L3 resistance x 5'  Metronome at 70 bpm 1 RT around facility (226 feet) Forward lunges onto BOSU dome up 2X10 1-2 UE assist Toe tapping 6in step 2x 10  1 UE assist, slow and controlled                                   10/28/23 EXERCISE LOG  Exercise Repetitions and Resistance Comments  Nustep  L4 x 6 minutes   Sit to stand  10 reps w/ firm surface  20 reps w/ feet on foam pad    Static stance on airex  2 minutes  Wide BOS w/ frequent UE support from parallel bars   Standing donkey kicks  2 minutes  Alternating LE; BUE support from parallel bars   LAQ 4# x 20 reps each   Side stepping  2 x 1 minute Fingertip support from parallel bars  Standing heel raise  25 reps  BUE support from parallel bars   Steps  3 x 4 6 steps  2 railing progressing to 1 railing   Blank cell = exercise not performed today   10/15/23:  [place] UE/LE L3 resistance x 5' averages speed 69 SPM Toe tapping  6in step 2x 10 2 HHA then 1 with min A Discussed walking with AD, pt wanted to try walking stick 259ft gait training with cueing for sequence and speed, feel fall hazard than helpful 15x controlled STS slow no HHA  Sidestep 3RT front of counter RTB around thigh Hip flexor stretch 2x 30 on 12in step  10/13/23: Reviewed goals Educated  importance of HEP Compliance 433ft no AD 4 stage balance assessment (see above)  DGI 1. Gait level surface (1) Moderate Impairment: Walks 20', slow speed, abnormal gait pattern, evidence for imbalance. 2. Change in gait speed (1) Moderate Impairment: Makes only minor adjustments to walking speed, or accomplishes a change in speed with significant gait deviations, or changes speed but has significant gait deviations, or changes speed but loses balance but is able to recover and continue walking. 3. Gait with horizontal head turns (2) Mild Impairment: Performs head turns smoothly with slight change in gait velocity, i.e., minor disruption to smooth gait path or uses walking aid. 4. Gait with vertical head turns 1) Moderate Impairment: Performs head turns with moderate change in gait velocity, slows down, staggers but recovers, can continue to walk. 5. Gait and pivot turn (1) Moderate Impairment: Turns slowly, requires verbal cueing, requires several small steps to catch balance following turn and stop. 6. Step over obstacle (1) Moderate Impairment: Is able to step over box but must stop, then step over. May require verbal cueing. 7. Step around obstacles (2) Mild Impairment: Is able to step around both cones, but must slow down and adjust steps to clear cones. 8. Stairs (2) Mild Impairment: Alternating feet, must use rail.  TOTAL SCORE: 11 / 24  Sidestep 1RT step and pause x 12 ft cueing for posture, required HHA against wall  10/10/23: PT Eval and HEP   PATIENT EDUCATION:  Education details: progression Person educated: Patient Education method: Explanation Education comprehension: verbalized understanding  HOME EXERCISE PROGRAM: Access Code: [email or web address] URL: [email or web address] Date: 10/10/2023 Prepared by: Rosaria Powell-Butler  Exercises - Supine Bridge  - 2 x daily - 7 x weekly - 2 sets - 10 reps - 5 hold - Sidelying Hip Abduction  - 2 x daily - 7 x weekly  - 2 sets - 10 reps - Sit to Stand  - 2 x daily - 7 x weekly - 3 sets - 10 reps - Supine Transversus Abdominis Bracing - Hands on Stomach  - 2 x daily - 7 x weekly - 2 sets - 10 reps - 10 hold - Standing Tandem Balance with Counter Support  - 2 x daily - 7 x weekly - 3 sets - 30 hold  ASSESSMENT:  CLINICAL IMPRESSION: Progress note completed this session. Pt has made steady improvements with improved gait quality and overall stability and strength. Pt with improved activity confidence and static balance stance time.  Pt's overall static balance and  activity tolerance remain most challenged. Pt does have much improved quality as noted with larger stride, slower speed and upright posturing.  DGI is now 23/24 but issue remains with static balance, standing tolerance and being able to carry objects when ambulating.  Pt has only met 1 STG but is progressing towards all others.  Encouraged to work on her static standing tolerance, single leg stance and activities that challenge these at home.  Updated HEP to include vectors  Pt had to leave a few minutes early today for a lunch meeting.  Patient continues to require skilled physical therapy to address her  remaining impairments to return to her prior level of function.    Eval:  Patient is a 78 y.o. female who was seen today for physical therapy evaluation and treatment for  gait disorder, imbalance  . On this date, patient demonstrates impaired self perception of balance via ABC scale, decreased LE strength (L>R), poor posture, dec endurance, and impaired balance, all of which may be contributing to patients decreased activity tolerance, impaired gait, and fall risk. Patient will benefit from continued skilled physical therapy in order to address these deficits/impairments to improve overall function and quality of life.    OBJECTIVE IMPAIRMENTS: Abnormal gait, decreased activity tolerance, decreased balance, decreased coordination, decreased endurance,  decreased mobility, difficulty walking, decreased ROM, decreased strength, impaired flexibility, improper body mechanics, postural dysfunction, and pain.   ACTIVITY LIMITATIONS: carrying, lifting, bending, sitting, standing, squatting, stairs, transfers, and reach over head  PARTICIPATION LIMITATIONS: meal prep, cleaning, laundry, community activity, and yard work  PERSONAL FACTORS: Time since onset of injury/illness/exacerbation are also affecting patient's functional outcome.   REHAB POTENTIAL: Good  CLINICAL DECISION MAKING: Stable/uncomplicated  EVALUATION COMPLEXITY: Low   GOALS: Goals reviewed with patient? Yes  SHORT TERM GOALS: Target date: 10/31/23 Patient will be independent with performance of HEP to demonstrate adequate self management of symptoms.  Baseline:  Goal status: MET  2.   Patient will report at least a 25% improvement with function and/or pain reduction overall since beginning PT. Baseline:  Goal status: IN PROGRESS (10%)   LONG TERM GOALS: Target date: 11/21/23 Patient will improve ABC Scale score by 10% to demonstrate improved perceived function while meeting MCID.  Baseline: Goal status: IN PROGRESS (4% improvement)  2.  Patient will be able to statically stand at least 2 minutes without UE support to demonstrate improved standing tolerance needed for community ambulation.  Baseline:  Goal status: IN PROGRESS (less than 30 seconds)  3.  Patient will score at least a  4/5 on  LLE MMT to demonstrate increased LE strength and/or power needed to improve ambulation/gait mechanics.  Baseline:  Goal status: IN PROGRESS   4.  Patient will increase 2 minute walk test gait distance to 30 ft with least restrictive assistive device to demonstrate improved endurance and functional mobility needed for ambulating household and community distances.  Baseline: Goal status: IN PROGRESS  5. Patient will be able to maintain tandem balance bilaterally for at least 10  seconds without UE support to demonstrate improved balance needed to demonstrate decreased fall risk.  Baseline: Goal status:PARTLY MET (have met with Lt leading but not Rt)   PLAN:  PT FREQUENCY: 2x/week  PT DURATION: 6 weeks  PLANNED INTERVENTIONS: 97164- PT Re-evaluation, 97110-Therapeutic exercises, 97530- Therapeutic activity, [id number]- Neuromuscular re-education, 97535- Self Care, [id number]- Manual therapy, [id number]- Gait training, [phone]- Electrical stimulation (manual), [id number]- Traction (mechanical), 20560 (1-2 muscles), 20561 (3+ muscles)- Dry Needling, Patient/Family education, Balance training, Stair training, Taping, Joint mobilization, Spinal mobilization, Cryotherapy, and Moist heat  PLAN FOR NEXT SESSION: continue to progress LE and Postural strength, static and dynamic balance, activity tolerance.     Greig KATHEE Fuse, PTA/CLT [facility] Ph: [phone] 1:01 PM, 11/11/23

## 2023-11-13 ENCOUNTER — Ambulatory Visit (HOSPITAL_COMMUNITY)

## 2023-11-13 ENCOUNTER — Encounter (HOSPITAL_COMMUNITY): Payer: Self-pay

## 2023-11-13 DIAGNOSIS — Z7409 Other reduced mobility: Secondary | ICD-10-CM

## 2023-11-13 DIAGNOSIS — M6281 Muscle weakness (generalized): Secondary | ICD-10-CM | POA: Diagnosis not present

## 2023-11-13 NOTE — Therapy (Signed)
 OUTPATIENT PHYSICAL THERAPY LOWER EXTREMITY TREATMENT  Patient Name: Emma Henderson MRN: [id number] DOB:[birth year], 79 y.o., female Today's Date: 11/13/2023  END OF SESSION:  PT End of Session - 11/13/23 0949     Visit Number 8    Number of Visits 13    Date for Recertification  12/16/23    Authorization Type [facility]    Authorization Time Period no shara    PT Start Time [phone]    PT Stop Time 1028    PT Time Calculation (min) 39 min    Equipment Utilized During Treatment Gait belt    Activity Tolerance Patient tolerated treatment well;Patient limited by fatigue    Behavior During Therapy [facility] for tasks assessed/performed              Past Medical History:  Diagnosis Date   Arthritis    hands (11/04/2012)   Cataract    Diverticulosis 2006   Gait abnormality 06/27/2020   Headache(784.0)    probably monthly 11/04/2012)   Osteopenia 05/2017   T score -1.8 FRAX 11.6% / 2.3%   Perforated diverticulitis 07/04/2012   Umbilical hernia 07/04/2012   Unsteady gait    Urinary incontinence    Past Surgical History:  Procedure Laterality Date   CATARACT EXTRACTION W/PHACO Left 12/11/2021   Procedure: CATARACT EXTRACTION PHACO AND INTRAOCULAR LENS PLACEMENT (IOC);  Surgeon: Juli Blunt, MD;  Location: [facility];  Service: Ophthalmology;  Laterality: Left;  CDE: 12.21   CATARACT EXTRACTION W/PHACO Right 12/28/2021   Procedure: CATARACT EXTRACTION PHACO AND INTRAOCULAR LENS PLACEMENT (IOC);  Surgeon: Juli Blunt, MD;  Location: [facility];  Service: Ophthalmology;  Laterality: Right;  CDE: 13.13   CERVICAL SPINE SURGERY  ~ 1997   COLON SURGERY     HERNIA REPAIR  11/04/2012   umbilical   LAPAROSCOPIC PARTIAL COLECTOMY N/A 11/04/2012   Procedure: LAPAROSCOPIC SIGMOID COLECTOMY;  Surgeon: Vicenta DELENA Poli, MD;  Location: [facility];  Service: General;  Laterality: N/A;   LAPAROSCOPIC SIGMOID COLECTOMY  11/04/2012   ABSCESS DIVERTICULAR      LUMBAR SPINE SURGERY  ~ 1987    PROCTOPLASTY N/A 11/04/2012   Procedure: RIGID PROCTOSCOPY;  Surgeon: Vicenta DELENA Poli, MD;  Location: [facility];  Service: General;  Laterality: N/A;   Patient Active Problem List   Diagnosis Date Noted   Small vessel disease, cerebrovascular 01/04/2021   Gait abnormality 06/27/2020   Diverticulitis of intestine with perforation 07/04/2012   Osteopenia 07/04/2012   Umbilical hernia 07/04/2012    PCP: Sheryle Carwin, MD  REFERRING PROVIDER: Sheryle Carwin, MD  REFERRING DIAG:  gait disorder, imbalance    THERAPY DIAG:  Muscle weakness (generalized)  Impaired functional mobility, balance, gait, and endurance  Rationale for Evaluation and Treatment: Rehabilitation  ONSET DATE: Chronic, years  SUBJECTIVE:   SUBJECTIVE STATEMENT: Feeling good today, no reports of pain or recent falls.    Exercises going well at home.    Eval:  Patient reports she is here for her balance issues. Reports she has fallen once in last 6 months but she trips a lot and has some near misses. Reports she has some leg pain once standing in static position, LLE>RLE. Walking is fine as long as its not too far. She walks fast so she wont have to be on legs for long amount of time. Reports she developed scoliosis over time. Reports balance has been an ongoing issue but reports It has gotten worse recently  PERTINENT HISTORY: 2 previous spine surgeries:  one in cervical and low back, 1980s osteopenia  PAIN:  Are you having pain? No  PRECAUTIONS: None  RED FLAGS: None   WEIGHT BEARING RESTRICTIONS: No  FALLS:  Has patient fallen in last 6 months? Yes. Number of falls last fall June 19th, most falls due to tripping secondary to not picking up her feet  LIVING ENVIRONMENT: Lives with: lives alone Lives in: House/apartment Stairs: Yes: Internal: 12-13 steps; on left going up Has following equipment at home: Single point cane, does not use  OCCUPATION: Retired Runner, broadcasting/film/video.    PLOF: Independent  PATIENT  GOALS: I wish I could be more comfortable standing, and standing straighter  NEXT MD VISIT: Not set.   OBJECTIVE:  Note: Objective measures were completed at Evaluation unless otherwise noted.  DIAGNOSTIC FINDINGS: N/A  PATIENT SURVEYS:  ABC scale: The Activities-Specific Balance Confidence (ABC) Scale 0% 10 20 30  40 50 60 70 80 90 100% No confidence<->completely confident  "How confident are you that you will not lose your balance or become unsteady when you . . .   Total ABC score: evaluation: 920 / 1600 = 57.5 % 11/11/23:  980 / 1600 = 61.3 %  COGNITION: Overall cognitive status: Within functional limits for tasks assessed     SENSATION: Light touch: Impaired  Dec sensation on L L1-2 dermatomes   POSTURE: rounded shoulders, forward head, flexed trunk , and laterally flexed to R   PALPATION:   LOWER EXTREMITY ROM:  Active ROM Right eval Left eval  Hip flexion    Hip extension    Hip abduction    Hip adduction    Hip internal rotation    Hip external rotation    Knee flexion    Knee extension    Ankle dorsiflexion    Ankle plantarflexion    Ankle inversion    Ankle eversion     (Blank rows = not tested)  LOWER EXTREMITY MMT:  MMT Right eval Left eval  Hip flexion 4 4-  Hip extension 4 4-  Hip abduction 4 4-  Hip adduction    Hip internal rotation    Hip external rotation    Knee flexion    Knee extension    Ankle dorsiflexion 5 5  Ankle plantarflexion    Ankle inversion    Ankle eversion     (Blank rows = not tested)  LOWER EXTREMITY SPECIAL TESTS:    FUNCTIONAL TESTS:  5 times sit to stand: 11 first trial, 8 second trial  Timed up and go (TUG): 9 no AD   2 minute walk test: 10/13/23: 483 ft no AD   10/13/23: Four Stage Balance:  Side by side: 30, BLE very shaky due to fatigue, 10/13/23: 11.06 prior need for UE support Semi tandem: 9 with RLE leading     7.53 With Lt LE leading Tandem: 25 with RLE leading, very shaky due to  fatigue    3 Lt LE leading SL:  Lt 3   Rt 4  GAIT: Distance walked: 75 ft in session  Assistive device utilized: None Level of assistance: Complete Independence Comments: slightly inc gait velocity, lateral lean to R due to scoliosis, dec stance time in LLE  TREATMENT DATE:  11/13/23: Standing: -10 Cone rotation cueing to slow down and stand tall -10 STS with cueing for hip thrust for gluteal engangement and posture 2nd set with NBOS 10x -Sidestep front of mat with RTB 5RT required seated rest break following each set.   Supine: -Bridge with feet on green ball 2x 10 -Bridge with feet on green ball with hamstring curls Sidelying:  -abduction 15 -Postural strengthening with RTB Rows      RTB shoulder extension 10x -Vector stance 3x 5 with BUE assistance (attempted 1 HHA)  11/11/23  progress note 5 times sit to stand: 8.7 no UE (was  11 first trial, 8 second trial)  Timed up and go (TUG): 7.46 (was 9 no AD)   2 minute walk test: 10/13/23: 452 feet with larger stride, improved posturing, safer gait no AD (was 483 ft no AD)  Four Stage Balance:  Feet together 23 very shaky due to fatigue (10/13/23: 11.06 prior need for UE support)  tandem: 7.11 (was 9) with RLE leading      11.8 with forward leaning (was 7.53) With Lt LE leading Single leg stance:  Lt: 6 Rt:8  (was Lt 3  Rt 4) Standing:  vectors 5X3 each side with 1 UE assist  ABC confidence:  980 / 1600 = 61.3 % (was 920 / 1600 = 57.5 %) DGI 1. Gait level surface (3) Normal: Walks 20', no assistive devices, good sped, no evidence for imbalance, normal gait pattern 2. Change in gait speed (3) Normal: Able to smoothly change walking speed without loss of balance or gait deviation. Shows a significant difference in walking speeds between normal, fast and slow speeds. 3. Gait with horizontal  head turns (3) Normal: Performs head turns smoothly with no change in gait. 4. Gait with vertical head turns (3) Normal: Performs head turns smoothly with no change in gait. 5. Gait and pivot turn (3) Normal: Pivot turns safely within 3 seconds and stops quickly with no loss of balance. 6. Step over obstacle (3) Normal: Is able to step over the box without changing gait speed, no evidence of imbalance. 7. Step around obstacles (3) Normal: Is able to walk around cones safely without changing gait speed; no evidence of imbalance. 8. Stairs (2) Mild Impairment: Alternating feet, must use rail.  TOTAL SCORE: 23 / 24   11/05/23:  [place] UE/LE L4 resistance x 5'  Standing:  Forward lunges onto BOSU dome up 2X10 1-2 UE assist Toe tapping 6in step 2x 10  1 UE assist-2 when fatigued, slow and controlled Forward step ups 6 2X10 each with bil UE assist Lateral step downs, eccentric lowering Ambulation working in increasing stride, upright posturing   10/30/23:  [place] UE/LE L3 resistance x 5'  Metronome at 70 bpm 1 RT around facility (226 feet) Forward lunges onto BOSU dome up 2X10 1-2 UE assist Toe tapping 6in step 2x 10  1 UE assist, slow and controlled                                   10/28/23 EXERCISE LOG  Exercise Repetitions and Resistance Comments  Nustep  L4 x 6 minutes   Sit to stand  10 reps w/ firm surface  20 reps w/ feet on foam pad    Static stance on airex  2 minutes  Wide BOS w/ frequent UE support from parallel bars  Standing donkey kicks  2 minutes  Alternating LE; BUE support from parallel bars   LAQ 4# x 20 reps each   Side stepping  2 x 1 minute Fingertip support from parallel bars  Standing heel raise  25 reps  BUE support from parallel bars   Steps  3 x 4 6 steps  2 railing progressing to 1 railing   Blank cell = exercise not performed today   10/15/23:  [place] UE/LE L3 resistance x 5' averages speed 69 SPM Toe  tapping 6in step 2x 10 2 HHA then 1 with min A Discussed walking with AD, pt wanted to try walking stick 219ft gait training with cueing for sequence and speed, feel fall hazard than helpful 15x controlled STS slow no HHA  Sidestep 3RT front of counter RTB around thigh Hip flexor stretch 2x 30 on 12in step  10/13/23: Reviewed goals Educated importance of HEP Compliance 416ft no AD 4 stage balance assessment (see above)  DGI 1. Gait level surface (1) Moderate Impairment: Walks 20', slow speed, abnormal gait pattern, evidence for imbalance. 2. Change in gait speed (1) Moderate Impairment: Makes only minor adjustments to walking speed, or accomplishes a change in speed with significant gait deviations, or changes speed but has significant gait deviations, or changes speed but loses balance but is able to recover and continue walking. 3. Gait with horizontal head turns (2) Mild Impairment: Performs head turns smoothly with slight change in gait velocity, i.e., minor disruption to smooth gait path or uses walking aid. 4. Gait with vertical head turns 1) Moderate Impairment: Performs head turns with moderate change in gait velocity, slows down, staggers but recovers, can continue to walk. 5. Gait and pivot turn (1) Moderate Impairment: Turns slowly, requires verbal cueing, requires several small steps to catch balance following turn and stop. 6. Step over obstacle (1) Moderate Impairment: Is able to step over box but must stop, then step over. May require verbal cueing. 7. Step around obstacles (2) Mild Impairment: Is able to step around both cones, but must slow down and adjust steps to clear cones. 8. Stairs (2) Mild Impairment: Alternating feet, must use rail.  TOTAL SCORE: 11 / 24  Sidestep 1RT step and pause x 12 ft cueing for posture, required HHA against wall  10/10/23: PT Eval and HEP   PATIENT EDUCATION:  Education details: progression Person educated:  Patient Education method: Explanation Education comprehension: verbalized understanding  HOME EXERCISE PROGRAM: Access Code: [email or web address] URL: [email or web address] Date: 10/10/2023 Prepared by: Rosaria Powell-Butler  Exercises - Supine Bridge  - 2 x daily - 7 x weekly - 2 sets - 10 reps - 5 hold - Sidelying Hip Abduction  - 2 x daily - 7 x weekly - 2 sets - 10 reps - Sit to Stand  - 2 x daily - 7 x weekly - 3 sets - 10 reps - Supine Transversus Abdominis Bracing - Hands on Stomach  - 2 x daily - 7 x weekly - 2 sets - 10 reps - 10 hold - Standing Tandem Balance with Counter Support  - 2 x daily - 7 x weekly - 3 sets - 30 hold  11/13/23: - Static standing front of counter, awareness of posture and removal of UE support as able.  ASSESSMENT:  CLINICAL IMPRESSION: Pt continues to be limited with static balance, LE weakness and awareness of posture.  With prolonged standing pt with tendency to increased forward trunk flexion.  Session focus with static  balance, gluteal strengthening and additional posture strengthening.  Pt required seated rest break between each activities due to visible LE Lt>Rt fatigue.  Gait belt used for safety with min A and reminding importance of posture.  Reviewed current exercise program, pt encouraged to begin static standing front of counter at home with intermittent UE removal safely, verbalized understanding.    Eval:  Patient is a 78 y.o. female who was seen today for physical therapy evaluation and treatment for  gait disorder, imbalance  . On this date, patient demonstrates impaired self perception of balance via ABC scale, decreased LE strength (L>R), poor posture, dec endurance, and impaired balance, all of which may be contributing to patients decreased activity tolerance, impaired gait, and fall risk. Patient will benefit from continued skilled physical therapy in order to address these deficits/impairments to improve overall function and quality  of life.    OBJECTIVE IMPAIRMENTS: Abnormal gait, decreased activity tolerance, decreased balance, decreased coordination, decreased endurance, decreased mobility, difficulty walking, decreased ROM, decreased strength, impaired flexibility, improper body mechanics, postural dysfunction, and pain.   ACTIVITY LIMITATIONS: carrying, lifting, bending, sitting, standing, squatting, stairs, transfers, and reach over head  PARTICIPATION LIMITATIONS: meal prep, cleaning, laundry, community activity, and yard work  PERSONAL FACTORS: Time since onset of injury/illness/exacerbation are also affecting patient's functional outcome.   REHAB POTENTIAL: Good  CLINICAL DECISION MAKING: Stable/uncomplicated  EVALUATION COMPLEXITY: Low   GOALS: Goals reviewed with patient? Yes  SHORT TERM GOALS: Target date: 10/31/23 Patient will be independent with performance of HEP to demonstrate adequate self management of symptoms.  Baseline:  Goal status: MET  2.   Patient will report at least a 25% improvement with function and/or pain reduction overall since beginning PT. Baseline:  Goal status: IN PROGRESS (10%)   LONG TERM GOALS: Target date: 11/21/23 Patient will improve ABC Scale score by 10% to demonstrate improved perceived function while meeting MCID.  Baseline: Goal status: IN PROGRESS (4% improvement)  2.  Patient will be able to statically stand at least 2 minutes without UE support to demonstrate improved standing tolerance needed for community ambulation.  Baseline:  Goal status: IN PROGRESS (less than 30 seconds)  3.  Patient will score at least a  4/5 on  LLE MMT to demonstrate increased LE strength and/or power needed to improve ambulation/gait mechanics.  Baseline:  Goal status: IN PROGRESS   4.  Patient will increase 2 minute walk test gait distance to 30 ft with least restrictive assistive device to demonstrate improved endurance and functional mobility needed for ambulating  household and community distances.  Baseline: Goal status: IN PROGRESS  5. Patient will be able to maintain tandem balance bilaterally for at least 10 seconds without UE support to demonstrate improved balance needed to demonstrate decreased fall risk.  Baseline: Goal status:PARTLY MET (have met with Lt leading but not Rt)   PLAN:  PT FREQUENCY: 2x/week  PT DURATION: 6 weeks  PLANNED INTERVENTIONS: 97164- PT Re-evaluation, 97110-Therapeutic exercises, 97530- Therapeutic activity, [id number]- Neuromuscular re-education, 97535- Self Care, [id number]- Manual therapy, [id number]- Gait training, [phone]- Electrical stimulation (manual), [id number]- Traction (mechanical), 20560 (1-2 muscles), 20561 (3+ muscles)- Dry Needling, Patient/Family education, Balance training, Stair training, Taping, Joint mobilization, Spinal mobilization, Cryotherapy, and Moist heat  PLAN FOR NEXT SESSION: continue to progress LE and Postural strength, static and dynamic balance, activity tolerance.     Augustin Mclean, LPTA/CLT; CBIS [phone]  2:05 PM, 11/13/23

## 2023-11-18 ENCOUNTER — Ambulatory Visit (HOSPITAL_COMMUNITY): Admitting: Physical Therapy

## 2023-11-18 DIAGNOSIS — Z7409 Other reduced mobility: Secondary | ICD-10-CM

## 2023-11-18 DIAGNOSIS — M6281 Muscle weakness (generalized): Secondary | ICD-10-CM

## 2023-11-18 NOTE — Therapy (Signed)
 OUTPATIENT PHYSICAL THERAPY LOWER EXTREMITY TREATMENT  Patient Name: Emma Henderson MRN: [id number] DOB:1945-07-20, 78 y.o., female Today's Date: 11/18/2023  END OF SESSION:  PT End of Session - 11/18/23 1052     Visit Number 9    Number of Visits 13    Date for Recertification  12/16/23    Authorization Type [facility]    Authorization Time Period no auth    PT Start Time [phone]    PT Stop Time 1030    PT Time Calculation (min) 40 min    Equipment Utilized During Treatment Gait belt    Activity Tolerance Patient tolerated treatment well;Patient limited by fatigue    Behavior During Therapy [facility] for tasks assessed/performed               Past Medical History:  Diagnosis Date   Arthritis    hands (11/04/2012)   Cataract    Diverticulosis 2006   Gait abnormality 06/27/2020   Headache(784.0)    probably monthly 11/04/2012)   Osteopenia 05/2017   T score -1.8 FRAX 11.6% / 2.3%   Perforated diverticulitis 07/04/2012   Umbilical hernia 07/04/2012   Unsteady gait    Urinary incontinence    Past Surgical History:  Procedure Laterality Date   CATARACT EXTRACTION W/PHACO Left 12/11/2021   Procedure: CATARACT EXTRACTION PHACO AND INTRAOCULAR LENS PLACEMENT (IOC);  Surgeon: Juli Blunt, MD;  Location: [facility];  Service: Ophthalmology;  Laterality: Left;  CDE: 12.21   CATARACT EXTRACTION W/PHACO Right 12/28/2021   Procedure: CATARACT EXTRACTION PHACO AND INTRAOCULAR LENS PLACEMENT (IOC);  Surgeon: Juli Blunt, MD;  Location: [facility];  Service: Ophthalmology;  Laterality: Right;  CDE: 13.13   CERVICAL SPINE SURGERY  ~ 1997   COLON SURGERY     HERNIA REPAIR  11/04/2012   umbilical   LAPAROSCOPIC PARTIAL COLECTOMY N/A 11/04/2012   Procedure: LAPAROSCOPIC SIGMOID COLECTOMY;  Surgeon: Vicenta DELENA Poli, MD;  Location: [facility];  Service: General;  Laterality: N/A;   LAPAROSCOPIC SIGMOID COLECTOMY  11/04/2012   ABSCESS DIVERTICULAR      LUMBAR SPINE SURGERY  ~ 1987    PROCTOPLASTY N/A 11/04/2012   Procedure: RIGID PROCTOSCOPY;  Surgeon: Vicenta DELENA Poli, MD;  Location: [facility] OR;  Service: General;  Laterality: N/A;   Patient Active Problem List   Diagnosis Date Noted   Small vessel disease, cerebrovascular 01/04/2021   Gait abnormality 06/27/2020   Diverticulitis of intestine with perforation 07/04/2012   Osteopenia 07/04/2012   Umbilical hernia 07/04/2012    PCP: Sheryle Carwin, MD  REFERRING PROVIDER: Sheryle Carwin, MD  REFERRING DIAG:  gait disorder, imbalance    THERAPY DIAG:  No diagnosis found.  Rationale for Evaluation and Treatment: Rehabilitation  ONSET DATE: Chronic, years  SUBJECTIVE:   SUBJECTIVE STATEMENT: Feeling good today, no reports of pain or recent falls.    Exercises going well at home.    Eval:  Patient reports she is here for her balance issues. Reports she has fallen once in last 6 months but she trips a lot and has some near misses. Reports she has some leg pain once standing in static position, LLE>RLE. Walking is fine as long as its not too far. She walks fast so she wont have to be on legs for long amount of time. Reports she developed scoliosis over time. Reports balance has been an ongoing issue but reports It has gotten worse recently  PERTINENT HISTORY: 2 previous spine surgeries: one in cervical and low back, 1980s  osteopenia  PAIN:  Are you having pain? No  PRECAUTIONS: None  RED FLAGS: None   WEIGHT BEARING RESTRICTIONS: No  FALLS:  Has patient fallen in last 6 months? Yes. Number of falls last fall June 19th, most falls due to tripping secondary to not picking up her feet  LIVING ENVIRONMENT: Lives with: lives alone Lives in: House/apartment Stairs: Yes: Internal: 12-13 steps; on left going up Has following equipment at home: Single point cane, does not use  OCCUPATION: Retired Runner, Broadcasting/film/video.    PLOF: Independent  PATIENT GOALS: I wish I could be more comfortable standing, and standing  straighter  NEXT MD VISIT: Not set.   OBJECTIVE:  Note: Objective measures were completed at Evaluation unless otherwise noted.  DIAGNOSTIC FINDINGS: N/A  PATIENT SURVEYS:  ABC scale: The Activities-Specific Balance Confidence (ABC) Scale 0% 10 20 30  40 50 60 70 80 90 100% No confidence<->completely confident  "How confident are you that you will not lose your balance or become unsteady when you . . .   Total ABC score: evaluation: 920 / 1600 = 57.5 % 11/11/23:  980 / 1600 = 61.3 %  COGNITION: Overall cognitive status: Within functional limits for tasks assessed     SENSATION: Light touch: Impaired  Dec sensation on L L1-2 dermatomes   POSTURE: rounded shoulders, forward head, flexed trunk , and laterally flexed to R   PALPATION:   LOWER EXTREMITY ROM:  Active ROM Right eval Left eval  Hip flexion    Hip extension    Hip abduction    Hip adduction    Hip internal rotation    Hip external rotation    Knee flexion    Knee extension    Ankle dorsiflexion    Ankle plantarflexion    Ankle inversion    Ankle eversion     (Blank rows = not tested)  LOWER EXTREMITY MMT:  MMT Right eval Left eval  Hip flexion 4 4-  Hip extension 4 4-  Hip abduction 4 4-  Hip adduction    Hip internal rotation    Hip external rotation    Knee flexion    Knee extension    Ankle dorsiflexion 5 5  Ankle plantarflexion    Ankle inversion    Ankle eversion     (Blank rows = not tested)  LOWER EXTREMITY SPECIAL TESTS:    FUNCTIONAL TESTS:  5 times sit to stand: 11 first trial, 8 second trial  Timed up and go (TUG): 9 no AD   2 minute walk test: 10/13/23: 483 ft no AD   10/13/23: Four Stage Balance:  Side by side: 30, BLE very shaky due to fatigue, 10/13/23: 11.06 prior need for UE support Semi tandem: 9 with RLE leading     7.53 With Lt LE leading Tandem: 25 with RLE leading, very shaky due to fatigue    3 Lt LE leading SL:  Lt 3   Rt 4  GAIT: Distance  walked: 75 ft in session  Assistive device utilized: None Level of assistance: Complete Independence Comments: slightly inc gait velocity, lateral lean to R due to scoliosis, dec stance time in LLE  TREATMENT DATE:  11/18/23: Standing:  all completed with CGA 10 [place]one rotation cueing to slow down and stand tall, 2 RT inside // bars with frequent HHA but encouragement not to use UE 10 STS with cueing for hip thrust for gluteal engangement and posture 2nd set with NBOS 10x Sidestep in // bars 5RT with short standing rest at end of each lap Retro gait inside bars no UE assist 3RT   6 lateral step up and overs with bil UE assist 5RT  11/13/23: Standing: -10 Cone rotation cueing to slow down and stand tall -10 STS with cueing for hip thrust for gluteal engangement and posture 2nd set with NBOS 10x -Sidestep front of mat with RTB 5RT required seated rest break following each set.   Supine: -Bridge with feet on green ball 2x 10 -Bridge with feet on green ball with hamstring curls Sidelying:  -abduction 15 -Postural strengthening with RTB Rows      RTB shoulder extension 10x -Vector stance 3x 5 with BUE assistance (attempted 1 HHA)  11/11/23  progress note 5 times sit to stand: 8.7 no UE (was  11 first trial, 8 second trial)  Timed up and go (TUG): 7.46 (was 9 no AD)   2 minute walk test: 10/13/23: 452 feet with larger stride, improved posturing, safer gait no AD (was 483 ft no AD)  Four Stage Balance:  Feet together 23 very shaky due to fatigue (10/13/23: 11.06 prior need for UE support)  tandem: 7.11 (was 9) with RLE leading      11.8 with forward leaning (was 7.53) With Lt LE leading Single leg stance:  Lt: 6 Rt:8  (was Lt 3  Rt 4) Standing:  vectors 5X3 each side with 1 UE assist  ABC confidence:  980 / 1600 = 61.3 % (was 920 / 1600 = 57.5  %) DGI 1. Gait level surface (3) Normal: Walks 20', no assistive devices, good sped, no evidence for imbalance, normal gait pattern 2. Change in gait speed (3) Normal: Able to smoothly change walking speed without loss of balance or gait deviation. Shows a significant difference in walking speeds between normal, fast and slow speeds. 3. Gait with horizontal head turns (3) Normal: Performs head turns smoothly with no change in gait. 4. Gait with vertical head turns (3) Normal: Performs head turns smoothly with no change in gait. 5. Gait and pivot turn (3) Normal: Pivot turns safely within 3 seconds and stops quickly with no loss of balance. 6. Step over obstacle (3) Normal: Is able to step over the box without changing gait speed, no evidence of imbalance. 7. Step around obstacles (3) Normal: Is able to walk around cones safely without changing gait speed; no evidence of imbalance. 8. Stairs (2) Mild Impairment: Alternating feet, must use rail.  TOTAL SCORE: 23 / 24   11/05/23:  [place] UE/LE L4 resistance x 5'  Standing:  Forward lunges onto BOSU dome up 2X10 1-2 UE assist Toe tapping 6in step 2x 10  1 UE assist-2 when fatigued, slow and controlled Forward step ups 6 2X10 each with bil UE assist Lateral step downs, eccentric lowering Ambulation working in increasing stride, upright posturing   10/30/23:  [place] UE/LE L3 resistance x 5'  Metronome at 70 bpm 1 RT around facility (226 feet) Forward lunges onto BOSU dome up 2X10 1-2 UE assist Toe tapping 6in step 2x 10  1 UE assist, slow and controlled  10/28/23 EXERCISE LOG  Exercise Repetitions and Resistance Comments  Nustep  L4 x 6 minutes   Sit to stand  10 reps w/ firm surface  20 reps w/ feet on foam pad    Static stance on airex  2 minutes  Wide BOS w/ frequent UE support from parallel bars   Standing donkey kicks  2 minutes  Alternating LE; BUE support from  parallel bars   LAQ 4# x 20 reps each   Side stepping  2 x 1 minute Fingertip support from parallel bars  Standing heel raise  25 reps  BUE support from parallel bars   Steps  3 x 4 6 steps  2 railing progressing to 1 railing   Blank cell = exercise not performed today   10/15/23:  [place] UE/LE L3 resistance x 5' averages speed 69 SPM Toe tapping 6in step 2x 10 2 HHA then 1 with min A Discussed walking with AD, pt wanted to try walking stick 297ft gait training with cueing for sequence and speed, feel fall hazard than helpful 15x controlled STS slow no HHA  Sidestep 3RT front of counter RTB around thigh Hip flexor stretch 2x 30 on 12in step  10/13/23: Reviewed goals Educated importance of HEP Compliance 413ft no AD 4 stage balance assessment (see above)  DGI 1. Gait level surface (1) Moderate Impairment: Walks 20', slow speed, abnormal gait pattern, evidence for imbalance. 2. Change in gait speed (1) Moderate Impairment: Makes only minor adjustments to walking speed, or accomplishes a change in speed with significant gait deviations, or changes speed but has significant gait deviations, or changes speed but loses balance but is able to recover and continue walking. 3. Gait with horizontal head turns (2) Mild Impairment: Performs head turns smoothly with slight change in gait velocity, i.e., minor disruption to smooth gait path or uses walking aid. 4. Gait with vertical head turns 1) Moderate Impairment: Performs head turns with moderate change in gait velocity, slows down, staggers but recovers, can continue to walk. 5. Gait and pivot turn (1) Moderate Impairment: Turns slowly, requires verbal cueing, requires several small steps to catch balance following turn and stop. 6. Step over obstacle (1) Moderate Impairment: Is able to step over box but must stop, then step over. May require verbal cueing. 7. Step around obstacles (2) Mild Impairment: Is able to step  around both cones, but must slow down and adjust steps to clear cones. 8. Stairs (2) Mild Impairment: Alternating feet, must use rail.  TOTAL SCORE: 11 / 24  Sidestep 1RT step and pause x 12 ft cueing for posture, required HHA against wall  10/10/23: PT Eval and HEP   PATIENT EDUCATION:  Education details: progression Person educated: Patient Education method: Explanation Education comprehension: verbalized understanding  HOME EXERCISE PROGRAM: Access Code: [email or web address] URL: [email or web address] Date: 10/10/2023 Prepared by: Rosaria Powell-Butler  Exercises - Supine Bridge  - 2 x daily - 7 x weekly - 2 sets - 10 reps - 5 hold - Sidelying Hip Abduction  - 2 x daily - 7 x weekly - 2 sets - 10 reps - Sit to Stand  - 2 x daily - 7 x weekly - 3 sets - 10 reps - Supine Transversus Abdominis Bracing - Hands on Stomach  - 2 x daily - 7 x weekly - 2 sets - 10 reps - 10 hold - Standing Tandem Balance with Counter Support  - 2 x daily - 7 x weekly - 3 sets -  30 hold  11/13/23: - Static standing front of counter, awareness of posture and removal of UE support as able.  ASSESSMENT:  CLINICAL IMPRESSION: Focus remains on static balance challenges today, specifically maintaining upright posturing and slow, coordinated movements.  Pt almost goes into panic mode with increased mm tremors, increased forward trunk flexion and quick grabbing/reaching reactions with activities.  Constant cues needed to complete slower and improving posture. Pt requires frequent rest breaks during treatment due to limited activity tolerance.  Difficulty maintaining full 3 holds with cues to increase these, especially into extension. Gait belt used for safety during entire treatment.      Eval:  Patient is a 78 y.o. female who was seen today for physical therapy evaluation and treatment for  gait disorder, imbalance  . On this date, patient demonstrates impaired self perception of balance via ABC scale,  decreased LE strength (L>R), poor posture, dec endurance, and impaired balance, all of which may be contributing to patients decreased activity tolerance, impaired gait, and fall risk. Patient will benefit from continued skilled physical therapy in order to address these deficits/impairments to improve overall function and quality of life.    OBJECTIVE IMPAIRMENTS: Abnormal gait, decreased activity tolerance, decreased balance, decreased coordination, decreased endurance, decreased mobility, difficulty walking, decreased ROM, decreased strength, impaired flexibility, improper body mechanics, postural dysfunction, and pain.   ACTIVITY LIMITATIONS: carrying, lifting, bending, sitting, standing, squatting, stairs, transfers, and reach over head  PARTICIPATION LIMITATIONS: meal prep, cleaning, laundry, community activity, and yard work  PERSONAL FACTORS: Time since onset of injury/illness/exacerbation are also affecting patient's functional outcome.   REHAB POTENTIAL: Good  CLINICAL DECISION MAKING: Stable/uncomplicated  EVALUATION COMPLEXITY: Low   GOALS: Goals reviewed with patient? Yes  SHORT TERM GOALS: Target date: 10/31/23 Patient will be independent with performance of HEP to demonstrate adequate self management of symptoms.  Baseline:  Goal status: MET  2.   Patient will report at least a 25% improvement with function and/or pain reduction overall since beginning PT. Baseline:  Goal status: IN PROGRESS (10%)   LONG TERM GOALS: Target date: 11/21/23 Patient will improve ABC Scale score by 10% to demonstrate improved perceived function while meeting MCID.  Baseline: Goal status: IN PROGRESS (4% improvement)  2.  Patient will be able to statically stand at least 2 minutes without UE support to demonstrate improved standing tolerance needed for community ambulation.  Baseline:  Goal status: IN PROGRESS (less than 30 seconds)  3.  Patient will score at least a  4/5 on  LLE MMT  to demonstrate increased LE strength and/or power needed to improve ambulation/gait mechanics.  Baseline:  Goal status: IN PROGRESS   4.  Patient will increase 2 minute walk test gait distance to 30 ft with least restrictive assistive device to demonstrate improved endurance and functional mobility needed for ambulating household and community distances.  Baseline: Goal status: IN PROGRESS  5. Patient will be able to maintain tandem balance bilaterally for at least 10 seconds without UE support to demonstrate improved balance needed to demonstrate decreased fall risk.  Baseline: Goal status:PARTLY MET (have met with Lt leading but not Rt)   PLAN:  PT FREQUENCY: 2x/week  PT DURATION: 6 weeks  PLANNED INTERVENTIONS: [id number]- PT Re-evaluation, 97110-Therapeutic exercises, 97530- Therapeutic activity, 97112- Neuromuscular re-education, 97535- Self Care, [id number]- Manual therapy, [id number]- Gait training, [phone]- Electrical stimulation (manual), [id number]- Traction (mechanical), [phone] (1-2 muscles), [id number] (3+ muscles)- Dry Needling, Patient/Family education, Balance training, Stair training, Taping, Joint mobilization, Spinal mobilization, Cryotherapy,  and Moist heat  PLAN FOR NEXT SESSION: continue to progress LE and Postural strength, static and dynamic balance, activity tolerance.   Resume bird dogs in quadruped    Yeiren Whitecotton KATHEE Fuse, PTA/CLT [facility] Ph: [phone]  10:56 AM, 11/18/23

## 2023-11-18 NOTE — Addendum Note (Signed)
 Addended byBETHA QUIVERS, GREIG S on: 11/18/2023 08:08 AM   Modules accepted: Orders

## 2023-11-20 ENCOUNTER — Encounter (HOSPITAL_COMMUNITY): Payer: Self-pay

## 2023-11-20 ENCOUNTER — Ambulatory Visit (HOSPITAL_COMMUNITY)

## 2023-11-20 DIAGNOSIS — M6281 Muscle weakness (generalized): Secondary | ICD-10-CM | POA: Diagnosis not present

## 2023-11-20 DIAGNOSIS — Z7409 Other reduced mobility: Secondary | ICD-10-CM

## 2023-11-20 NOTE — Therapy (Signed)
 OUTPATIENT PHYSICAL THERAPY LOWER EXTREMITY TREATMENT  Patient Name: Emma Henderson MRN: [id number] DOB:[birth year], 78 y.o., female Today's Date: 11/20/2023  END OF SESSION:  PT End of Session - 11/20/23 1034     Visit Number 10    Number of Visits 13    Date for Recertification  12/16/23    Authorization Type [facility]    Authorization Time Period no auth    Progress Note Due on Visit 13   Progress note complete visit #7   PT Start Time 1035    PT Stop Time 1114    PT Time Calculation (min) 39 min    Equipment Utilized During Treatment Gait belt    Activity Tolerance Patient tolerated treatment well;Patient limited by fatigue    Behavior During Therapy [facility] for tasks assessed/performed               Past Medical History:  Diagnosis Date   Arthritis    hands (11/04/2012)   Cataract    Diverticulosis 2006   Gait abnormality 06/27/2020   Headache(784.0)    probably monthly 11/04/2012)   Osteopenia 05/2017   T score -1.8 FRAX 11.6% / 2.3%   Perforated diverticulitis 07/04/2012   Umbilical hernia 07/04/2012   Unsteady gait    Urinary incontinence    Past Surgical History:  Procedure Laterality Date   CATARACT EXTRACTION W/PHACO Left 12/11/2021   Procedure: CATARACT EXTRACTION PHACO AND INTRAOCULAR LENS PLACEMENT (IOC);  Surgeon: Juli Blunt, MD;  Location: [facility];  Service: Ophthalmology;  Laterality: Left;  CDE: 12.21   CATARACT EXTRACTION W/PHACO Right 12/28/2021   Procedure: CATARACT EXTRACTION PHACO AND INTRAOCULAR LENS PLACEMENT (IOC);  Surgeon: Juli Blunt, MD;  Location: [facility];  Service: Ophthalmology;  Laterality: Right;  CDE: 13.13   CERVICAL SPINE SURGERY  ~ 1997   COLON SURGERY     HERNIA REPAIR  11/04/2012   umbilical   LAPAROSCOPIC PARTIAL COLECTOMY N/A 11/04/2012   Procedure: LAPAROSCOPIC SIGMOID COLECTOMY;  Surgeon: Vicenta DELENA Poli, MD;  Location: [facility];  Service: General;  Laterality: N/A;   LAPAROSCOPIC SIGMOID COLECTOMY   11/04/2012   ABSCESS DIVERTICULAR      LUMBAR SPINE SURGERY  ~ 1987   PROCTOPLASTY N/A 11/04/2012   Procedure: RIGID PROCTOSCOPY;  Surgeon: Vicenta DELENA Poli, MD;  Location: [facility];  Service: General;  Laterality: N/A;   Patient Active Problem List   Diagnosis Date Noted   Small vessel disease, cerebrovascular 01/04/2021   Gait abnormality 06/27/2020   Diverticulitis of intestine with perforation 07/04/2012   Osteopenia 07/04/2012   Umbilical hernia 07/04/2012    PCP: Sheryle Carwin, MD  REFERRING PROVIDER: Sheryle Carwin, MD  REFERRING DIAG:  gait disorder, imbalance    THERAPY DIAG:  Muscle weakness (generalized)  Impaired functional mobility, balance, gait, and endurance  Rationale for Evaluation and Treatment: Rehabilitation  ONSET DATE: Chronic, years  SUBJECTIVE:   SUBJECTIVE STATEMENT: Has been told she is standing taller while walking.  Continues to have difficulty standing in one place.    Eval:  Patient reports she is here for her balance issues. Reports she has fallen once in last 6 months but she trips a lot and has some near misses. Reports she has some leg pain once standing in static position, LLE>RLE. Walking is fine as long as its not too far. She walks fast so she wont have to be on legs for long amount of time. Reports she developed scoliosis over time. Reports balance has been an  ongoing issue but reports It has gotten worse recently  PERTINENT HISTORY: 2 previous spine surgeries: one in cervical and low back, 1980s osteopenia  PAIN:  Are you having pain? No  PRECAUTIONS: None  RED FLAGS: None   WEIGHT BEARING RESTRICTIONS: No  FALLS:  Has patient fallen in last 6 months? Yes. Number of falls last fall June 19th, most falls due to tripping secondary to not picking up her feet  LIVING ENVIRONMENT: Lives with: lives alone Lives in: House/apartment Stairs: Yes: Internal: 12-13 steps; on left going up Has following equipment at home: Single point  cane, does not use  OCCUPATION: Retired Runner, Broadcasting/film/video.    PLOF: Independent  PATIENT GOALS: I wish I could be more comfortable standing, and standing straighter  NEXT MD VISIT: Not set.   OBJECTIVE:  Note: Objective measures were completed at Evaluation unless otherwise noted.  DIAGNOSTIC FINDINGS: N/A  PATIENT SURVEYS:  ABC scale: The Activities-Specific Balance Confidence (ABC) Scale 0% 10 20 30  40 50 60 70 80 90 100% No confidence<->completely confident  "How confident are you that you will not lose your balance or become unsteady when you . . .   Total ABC score: evaluation: 920 / 1600 = 57.5 % 11/11/23:  980 / 1600 = 61.3 %  COGNITION: Overall cognitive status: Within functional limits for tasks assessed     SENSATION: Light touch: Impaired  Dec sensation on L L1-2 dermatomes   POSTURE: rounded shoulders, forward head, flexed trunk , and laterally flexed to R   PALPATION:   LOWER EXTREMITY ROM:  Active ROM Right eval Left eval  Hip flexion    Hip extension    Hip abduction    Hip adduction    Hip internal rotation    Hip external rotation    Knee flexion    Knee extension    Ankle dorsiflexion    Ankle plantarflexion    Ankle inversion    Ankle eversion     (Blank rows = not tested)  LOWER EXTREMITY MMT:  MMT Right eval Left eval  Hip flexion 4 4-  Hip extension 4 4-  Hip abduction 4 4-  Hip adduction    Hip internal rotation    Hip external rotation    Knee flexion    Knee extension    Ankle dorsiflexion 5 5  Ankle plantarflexion    Ankle inversion    Ankle eversion     (Blank rows = not tested)  LOWER EXTREMITY SPECIAL TESTS:    FUNCTIONAL TESTS:  5 times sit to stand: 11 first trial, 8 second trial  Timed up and go (TUG): 9 no AD   2 minute walk test: 10/13/23: 483 ft no AD   10/13/23: Four Stage Balance:  Side by side: 30, BLE very shaky due to fatigue, 10/13/23: 11.06 prior need for UE support Semi tandem: 9 with RLE  leading     7.53 With Lt LE leading Tandem: 25 with RLE leading, very shaky due to fatigue    3 Lt LE leading SL:  Lt 3   Rt 4  GAIT: Distance walked: 75 ft in session  Assistive device utilized: None Level of assistance: Complete Independence Comments: slightly inc gait velocity, lateral lean to R due to scoliosis, dec stance time in LLE  TREATMENT DATE:  11/20/23:  WrayBETHA Chang dog 2 set 5x 5 STS with cueing for gluteal engangament and posture 2x 10 Gait slow cadence with cueing for posture and arm swing 2x 72ft  Seated rest break with lateral thoracic sidebend 5RT  RTB shoulder extension 10 x 2 sets RTB rows 6in step up and over with UE support  Toe tapping 6in 10x  10 cone rotation with cueing for posture, slow down and improve reaching outside BOS  11/18/23: Standing:  all completed with CGA 10 Cone rotation cueing to slow down and stand tall, 2 RT inside // bars with frequent HHA but encouragement not to use UE 10 STS with cueing for hip thrust for gluteal engangement and posture 2nd set with NBOS 10x Sidestep in // bars 5RT with short standing rest at end of each lap Retro gait inside bars no UE assist 3RT   6 lateral step up and overs with bil UE assist 5RT  11/13/23: Standing: -10 Cone rotation cueing to slow down and stand tall -10 STS with cueing for hip thrust for gluteal engangement and posture 2nd set with NBOS 10x -Sidestep front of mat with RTB 5RT required seated rest break following each set.   Supine: -Bridge with feet on green ball 2x 10 -Bridge with feet on green ball with hamstring curls Sidelying:  -abduction 15 -Postural strengthening with RTB Rows      RTB shoulder extension 10x -Vector stance 3x 5 with BUE assistance (attempted 1 HHA)  11/11/23  progress note 5 times sit to stand: 8.7 no UE (was  11  first trial, 8 second trial)  Timed up and go (TUG): 7.46 (was 9 no AD)   2 minute walk test: 10/13/23: 452 feet with larger stride, improved posturing, safer gait no AD (was 483 ft no AD)  Four Stage Balance:  Feet together 23 very shaky due to fatigue (10/13/23: 11.06 prior need for UE support)  tandem: 7.11 (was 9) with RLE leading      11.8 with forward leaning (was 7.53) With Lt LE leading Single leg stance:  Lt: 6 Rt:8  (was Lt 3  Rt 4) Standing:  vectors 5X3 each side with 1 UE assist  ABC confidence:  980 / 1600 = 61.3 % (was 920 / 1600 = 57.5 %) DGI 1. Gait level surface (3) Normal: Walks 20', no assistive devices, good sped, no evidence for imbalance, normal gait pattern 2. Change in gait speed (3) Normal: Able to smoothly change walking speed without loss of balance or gait deviation. Shows a significant difference in walking speeds between normal, fast and slow speeds. 3. Gait with horizontal head turns (3) Normal: Performs head turns smoothly with no change in gait. 4. Gait with vertical head turns (3) Normal: Performs head turns smoothly with no change in gait. 5. Gait and pivot turn (3) Normal: Pivot turns safely within 3 seconds and stops quickly with no loss of balance. 6. Step over obstacle (3) Normal: Is able to step over the box without changing gait speed, no evidence of imbalance. 7. Step around obstacles (3) Normal: Is able to walk around cones safely without changing gait speed; no evidence of imbalance. 8. Stairs (2) Mild Impairment: Alternating feet, must use rail.  TOTAL SCORE: 23 / 24   PATIENT EDUCATION:  Education details: progression Person educated: Patient Education method: Explanation Education comprehension: verbalized understanding  HOME EXERCISE PROGRAM: Access Code: [email or web address] URL: [email or web address] Date: 10/10/2023 Prepared by: Rosaria Settler  Exercises -  Supine Bridge  - 2 x daily - 7 x weekly - 2 sets -  10 reps - 5 hold - Sidelying Hip Abduction  - 2 x daily - 7 x weekly - 2 sets - 10 reps - Sit to Stand  - 2 x daily - 7 x weekly - 3 sets - 10 reps - Supine Transversus Abdominis Bracing - Hands on Stomach  - 2 x daily - 7 x weekly - 2 sets - 10 reps - 10 hold - Standing Tandem Balance with Counter Support  - 2 x daily - 7 x weekly - 3 sets - 30 hold  11/13/23: - Static standing front of counter, awareness of posture and removal of UE support as able.  11/20/23: GLENWOOD Chang Dog  - 2 x daily - 7 x weekly - 3 sets - 5 reps - 5 hold  ASSESSMENT:  CLINICAL IMPRESSION: Continued session focus with static balance and proximal strengthening.  Pt presents with low activity tolerance with standing without UE support or to sit.  Cueing required for posture through session.  Pt almost goes into panic mode with visible LE tremors, increased forward trunk flexion and reaching for support and requests to have seat.  Constant cueing required to slow gait cadence, increase stride length and improve awareness of posture.  Added bird dog to HEP for core and proximal strengthening, limited by fatigue with activity following 5 reps.  No reports of pain through session, was limited by fatigue.    Eval:  Patient is a 78 y.o. female who was seen today for physical therapy evaluation and treatment for  gait disorder, imbalance  . On this date, patient demonstrates impaired self perception of balance via ABC scale, decreased LE strength (L>R), poor posture, dec endurance, and impaired balance, all of which may be contributing to patients decreased activity tolerance, impaired gait, and fall risk. Patient will benefit from continued skilled physical therapy in order to address these deficits/impairments to improve overall function and quality of life.    OBJECTIVE IMPAIRMENTS: Abnormal gait, decreased activity tolerance, decreased balance, decreased coordination, decreased endurance, decreased mobility, difficulty walking,  decreased ROM, decreased strength, impaired flexibility, improper body mechanics, postural dysfunction, and pain.   ACTIVITY LIMITATIONS: carrying, lifting, bending, sitting, standing, squatting, stairs, transfers, and reach over head  PARTICIPATION LIMITATIONS: meal prep, cleaning, laundry, community activity, and yard work  PERSONAL FACTORS: Time since onset of injury/illness/exacerbation are also affecting patient's functional outcome.   REHAB POTENTIAL: Good  CLINICAL DECISION MAKING: Stable/uncomplicated  EVALUATION COMPLEXITY: Low   GOALS: Goals reviewed with patient? Yes  SHORT TERM GOALS: Target date: 10/31/23 Patient will be independent with performance of HEP to demonstrate adequate self management of symptoms.  Baseline:  Goal status: MET  2.   Patient will report at least a 25% improvement with function and/or pain reduction overall since beginning PT. Baseline:  Goal status: IN PROGRESS (10%)   LONG TERM GOALS: Target date: 11/21/23 Patient will improve ABC Scale score by 10% to demonstrate improved perceived function while meeting [facility].  Baseline: Goal status: IN PROGRESS (4% improvement)  2.  Patient will be able to statically stand at least 2 minutes without UE support to demonstrate improved standing tolerance needed for community ambulation.  Baseline:  Goal status: IN PROGRESS (less than 30 seconds)  3.  Patient will score at least a  4/5 on  LLE MMT to demonstrate increased LE strength and/or power needed to improve ambulation/gait mechanics.  Baseline:  Goal status:  IN PROGRESS   4.  Patient will increase 2 minute walk test gait distance to 30 ft with least restrictive assistive device to demonstrate improved endurance and functional mobility needed for ambulating household and community distances.  Baseline: Goal status: IN PROGRESS  5. Patient will be able to maintain tandem balance bilaterally for at least 10 seconds without UE support to  demonstrate improved balance needed to demonstrate decreased fall risk.  Baseline: Goal status:PARTLY MET (have met with Lt leading but not Rt)   PLAN:  PT FREQUENCY: 2x/week  PT DURATION: 6 weeks  PLANNED INTERVENTIONS: 97164- PT Re-evaluation, 97110-Therapeutic exercises, 97530- Therapeutic activity, [id number]- Neuromuscular re-education, 97535- Self Care, [id number]- Manual therapy, [id number]- Gait training, [phone]- Electrical stimulation (manual), [id number]- Traction (mechanical), 20560 (1-2 muscles), 20561 (3+ muscles)- Dry Needling, Patient/Family education, Balance training, Stair training, Taping, Joint mobilization, Spinal mobilization, Cryotherapy, and Moist heat  PLAN FOR NEXT SESSION: continue to progress LE and Postural strength, static and dynamic balance, activity tolerance.   F/U with new HEP compliance, bird dogs in quadruped   [place], LPTA/CLT; CBIS [phone]  6:30 PM, 11/20/23

## 2023-11-26 ENCOUNTER — Ambulatory Visit (HOSPITAL_COMMUNITY): Attending: Internal Medicine | Admitting: Physical Therapy

## 2023-11-26 DIAGNOSIS — Z7409 Other reduced mobility: Secondary | ICD-10-CM | POA: Diagnosis present

## 2023-11-26 DIAGNOSIS — M6281 Muscle weakness (generalized): Secondary | ICD-10-CM | POA: Insufficient documentation

## 2023-11-26 NOTE — Therapy (Signed)
 OUTPATIENT PHYSICAL THERAPY LOWER EXTREMITY TREATMENT  Patient Name: Emma Henderson MRN: [id number] DOB:[birth year], 78 y.o., female Today's Date: 11/26/2023  END OF SESSION:  PT End of Session - 11/26/23 1425     Visit Number 11    Number of Visits 13    Date for Recertification  12/16/23    Authorization Type [facility]    Authorization Time Period no auth    Progress Note Due on Visit 13   Progress note complete visit #7   PT Start Time 1120    PT Stop Time 1200    PT Time Calculation (min) 40 min    Equipment Utilized During Treatment Gait belt    Activity Tolerance Patient tolerated treatment well;Patient limited by fatigue    Behavior During Therapy [facility] for tasks assessed/performed                Past Medical History:  Diagnosis Date   Arthritis    hands (11/04/2012)   Cataract    Diverticulosis 2006   Gait abnormality 06/27/2020   Headache(784.0)    probably monthly 11/04/2012)   Osteopenia 05/2017   T score -1.8 FRAX 11.6% / 2.3%   Perforated diverticulitis 07/04/2012   Umbilical hernia 07/04/2012   Unsteady gait    Urinary incontinence    Past Surgical History:  Procedure Laterality Date   CATARACT EXTRACTION W/PHACO Left 12/11/2021   Procedure: CATARACT EXTRACTION PHACO AND INTRAOCULAR LENS PLACEMENT (IOC);  Surgeon: Juli Blunt, MD;  Location: [facility];  Service: Ophthalmology;  Laterality: Left;  CDE: 12.21   CATARACT EXTRACTION W/PHACO Right 12/28/2021   Procedure: CATARACT EXTRACTION PHACO AND INTRAOCULAR LENS PLACEMENT (IOC);  Surgeon: Juli Blunt, MD;  Location: [facility];  Service: Ophthalmology;  Laterality: Right;  CDE: 13.13   CERVICAL SPINE SURGERY  ~ 1997   COLON SURGERY     HERNIA REPAIR  11/04/2012   umbilical   LAPAROSCOPIC PARTIAL COLECTOMY N/A 11/04/2012   Procedure: LAPAROSCOPIC SIGMOID COLECTOMY;  Surgeon: Vicenta DELENA Poli, MD;  Location: [facility];  Service: General;  Laterality: N/A;   LAPAROSCOPIC SIGMOID COLECTOMY   11/04/2012   ABSCESS DIVERTICULAR      LUMBAR SPINE SURGERY  ~ 1987   PROCTOPLASTY N/A 11/04/2012   Procedure: RIGID PROCTOSCOPY;  Surgeon: Vicenta DELENA Poli, MD;  Location: [facility];  Service: General;  Laterality: N/A;   Patient Active Problem List   Diagnosis Date Noted   Small vessel disease, cerebrovascular 01/04/2021   Gait abnormality 06/27/2020   Diverticulitis of intestine with perforation 07/04/2012   Osteopenia 07/04/2012   Umbilical hernia 07/04/2012    PCP: Sheryle Carwin, MD  REFERRING PROVIDER: Sheryle Carwin, MD  REFERRING DIAG:  gait disorder, imbalance    THERAPY DIAG:  Muscle weakness (generalized)  Impaired functional mobility, balance, gait, and endurance  Rationale for Evaluation and Treatment: Rehabilitation  ONSET DATE: Chronic, years  SUBJECTIVE:   SUBJECTIVE STATEMENT: Pt states she is busy playing bridge at least 4X week.  Reports compliance with working on balance.     Eval:  Patient reports she is here for her balance issues. Reports she has fallen once in last 6 months but she trips a lot and has some near misses. Reports she has some leg pain once standing in static position, LLE>RLE. Walking is fine as long as its not too far. She walks fast so she wont have to be on legs for long amount of time. Reports she developed scoliosis over time. Reports balance has  been an ongoing issue but reports It has gotten worse recently  PERTINENT HISTORY: 2 previous spine surgeries: one in cervical and low back, 1980s osteopenia  PAIN:  Are you having pain? No  PRECAUTIONS: None  RED FLAGS: None   WEIGHT BEARING RESTRICTIONS: No  FALLS:  Has patient fallen in last 6 months? Yes. Number of falls last fall June 19th, most falls due to tripping secondary to not picking up her feet  LIVING ENVIRONMENT: Lives with: lives alone Lives in: House/apartment Stairs: Yes: Internal: 12-13 steps; on left going up Has following equipment at home: Single point cane,  does not use  OCCUPATION: Retired Runner, Broadcasting/film/video.    PLOF: Independent  PATIENT GOALS: I wish I could be more comfortable standing, and standing straighter  NEXT MD VISIT: Not set.   OBJECTIVE:  Note: Objective measures were completed at Evaluation unless otherwise noted.  DIAGNOSTIC FINDINGS: N/A  PATIENT SURVEYS:  ABC scale: The Activities-Specific Balance Confidence (ABC) Scale 0% 10 20 30  40 50 60 70 80 90 100% No confidence<->completely confident  "How confident are you that you will not lose your balance or become unsteady when you . . .   Total ABC score: evaluation: 920 / 1600 = 57.5 % 11/11/23:  980 / 1600 = 61.3 %  COGNITION: Overall cognitive status: Within functional limits for tasks assessed     SENSATION: Light touch: Impaired  Dec sensation on L L1-2 dermatomes   POSTURE: rounded shoulders, forward head, flexed trunk , and laterally flexed to R   PALPATION:   LLOWER EXTREMITY MMT:  MMT Right eval Left eval  Hip flexion 4 4-  Hip extension 4 4-  Hip abduction 4 4-  Hip adduction    Hip internal rotation    Hip external rotation    Knee flexion    Knee extension    Ankle dorsiflexion 5 5  Ankle plantarflexion    Ankle inversion    Ankle eversion     (Blank rows = not tested)  LOWER EXTREMITY SPECIAL TESTS:    FUNCTIONAL TESTS:  5 times sit to stand: 11 first trial, 8 second trial  Timed up and go (TUG): 9 no AD   2 minute walk test: 10/13/23: 483 ft no AD   10/13/23: Four Stage Balance:  Side by side: 30, BLE very shaky due to fatigue, 10/13/23: 11.06 prior need for UE support Semi tandem: 9 with RLE leading     7.53 With Lt LE leading Tandem: 25 with RLE leading, very shaky due to fatigue    3 Lt LE leading SL: Lt 3 Rt 4   11/11/23:  5 times sit to stand: 8.7 no UE (was  11 first trial, 8 second trial)  Timed up and go (TUG): 7.46 (was 9 no AD)   2 minute walk test: 10/13/23: 452 feet with larger stride, improved posturing,  safer gait no AD (was 483 ft no AD)  Four Stage Balance:  Feet together 23 very shaky due to fatigue (10/13/23: 11.06 prior need for UE support)  tandem: 7.11 (was 9) with RLE leading      11.8 with forward leaning (was 7.53) With Lt LE leading Single leg stance:  Lt: 6 Rt:8  (was Lt 3  Rt 4)    GAIT: Distance walked: 75 ft in session  Assistive device utilized: None Level of assistance: Complete Independence Comments: slightly inc gait velocity, lateral lean to R due to scoliosis, dec stance time in LLE  TREATMENT DATE:  11/23/23 Standing: cone rotation (8) transferring Lt-Rt, Rt-Lt 2X normal stance with SBA, no UE as able  Rt leading max 10 X 3 trials, Lt leading max 8 X 3 trials  RTB shoulder extension 2X10 with CGA  RTB rows 2X10 with CGA Sit to stands, slow descent with 5# KB  11/20/23:  Quadruped: Bird dog 2 set 5x 5 STS with cueing for gluteal engangament and posture 2x 10 Gait slow cadence with cueing for posture and arm swing 2x 74ft  Seated rest break with lateral thoracic sidebend 5RT  RTB shoulder extension 10 x 2 sets RTB rows 6in step up and over with UE support  Toe tapping 6in 10x  10 cone rotation with cueing for posture, slow down and improve reaching outside BOS  11/18/23: Standing:  all completed with CGA 10 Cone rotation cueing to slow down and stand tall, 2 RT inside // bars with frequent HHA but encouragement not to use UE 10 STS with cueing for hip thrust for gluteal engangement and posture 2nd set with NBOS 10x Sidestep in // bars 5RT with short standing rest at end of each lap Retro gait inside bars no UE assist 3RT   6 lateral step up and overs with bil UE assist 5RT  11/13/23: Standing: -10 Cone rotation cueing to slow down and stand tall -10 STS with cueing for hip thrust for gluteal engangement and  posture 2nd set with NBOS 10x -Sidestep front of mat with RTB 5RT required seated rest break following each set.   Supine: -Bridge with feet on green ball 2x 10 -Bridge with feet on green ball with hamstring curls Sidelying:  -abduction 15 -Postural strengthening with RTB Rows      RTB shoulder extension 10x -Vector stance 3x 5 with BUE assistance (attempted 1 HHA)  11/11/23  progress note 5 times sit to stand: 8.7 no UE (was  11 first trial, 8 second trial)  Timed up and go (TUG): 7.46 (was 9 no AD)   2 minute walk test: 10/13/23: 452 feet with larger stride, improved posturing, safer gait no AD (was 483 ft no AD)  Four Stage Balance:  Feet together 23 very shaky due to fatigue (10/13/23: 11.06 prior need for UE support)  tandem: 7.11 (was 9) with RLE leading      11.8 with forward leaning (was 7.53) With Lt LE leading Single leg stance:  Lt: 6 Rt:8  (was Lt 3  Rt 4) Standing:  vectors 5X3 each side with 1 UE assist  ABC confidence:  980 / 1600 = 61.3 % (was 920 / 1600 = 57.5 %) DGI 1. Gait level surface (3) Normal: Walks 20', no assistive devices, good sped, no evidence for imbalance, normal gait pattern 2. Change in gait speed (3) Normal: Able to smoothly change walking speed without loss of balance or gait deviation. Shows a significant difference in walking speeds between normal, fast and slow speeds. 3. Gait with horizontal head turns (3) Normal: Performs head turns smoothly with no change in gait. 4. Gait with vertical head turns (3) Normal: Performs head turns smoothly with no change in gait. 5. Gait and pivot turn (3) Normal: Pivot turns safely within 3 seconds and stops quickly with no loss of balance. 6. Step over obstacle (3) Normal: Is able to step over the box without changing gait speed, no evidence of imbalance. 7. Step around obstacles (3) Normal: Is able to walk around cones safely without changing gait speed; no evidence  of imbalance. 8. Stairs (2)  Mild Impairment: Alternating feet, must use rail.  TOTAL SCORE: 23 / 24   PATIENT EDUCATION:  Education details: progression Person educated: Patient Education method: Explanation Education comprehension: verbalized understanding  HOME EXERCISE PROGRAM: Access Code: [email or web address] URL: [email or web address] Date: 10/10/2023 Prepared by: Rosaria Powell-Butler  Exercises - Supine Bridge  - 2 x daily - 7 x weekly - 2 sets - 10 reps - 5 hold - Sidelying Hip Abduction  - 2 x daily - 7 x weekly - 2 sets - 10 reps - Sit to Stand  - 2 x daily - 7 x weekly - 3 sets - 10 reps - Supine Transversus Abdominis Bracing - Hands on Stomach  - 2 x daily - 7 x weekly - 2 sets - 10 reps - 10 hold - Standing Tandem Balance with Counter Support  - 2 x daily - 7 x weekly - 3 sets - 30 hold  11/13/23: - Static standing front of counter, awareness of posture and removal of UE support as able.  11/20/23: GLENWOOD Chang Dog  - 2 x daily - 7 x weekly - 3 sets - 5 reps - 5 hold  ASSESSMENT:  CLINICAL IMPRESSION: Continued focus on static balance challenges and proximal strengthening.  Large fear component to instability requiring constant verbalizations to increase confidence and reduce impulsivity to grab/reach outside BOS for objects.  Pt requires rest breaks between each set of activity as noted fatigue/trembling in LE's.  Cueing required for posture through session as tends to go into progressive forward flexion with all activities.  Pt does have improved gait quality with less verbal cues required for slow/controlled cadence. Entire session completed with CGA from therapist with no reports of pain through session.    Eval:  Patient is a 78 y.o. female who was seen today for physical therapy evaluation and treatment for  gait disorder, imbalance  . On this date, patient demonstrates impaired self perception of balance via ABC scale, decreased LE strength (L>R), poor posture, dec endurance, and impaired  balance, all of which may be contributing to patients decreased activity tolerance, impaired gait, and fall risk. Patient will benefit from continued skilled physical therapy in order to address these deficits/impairments to improve overall function and quality of life.    OBJECTIVE IMPAIRMENTS: Abnormal gait, decreased activity tolerance, decreased balance, decreased coordination, decreased endurance, decreased mobility, difficulty walking, decreased ROM, decreased strength, impaired flexibility, improper body mechanics, postural dysfunction, and pain.   ACTIVITY LIMITATIONS: carrying, lifting, bending, sitting, standing, squatting, stairs, transfers, and reach over head  PARTICIPATION LIMITATIONS: meal prep, cleaning, laundry, community activity, and yard work  PERSONAL FACTORS: Time since onset of injury/illness/exacerbation are also affecting patient's functional outcome.   REHAB POTENTIAL: Good  CLINICAL DECISION MAKING: Stable/uncomplicated  EVALUATION COMPLEXITY: Low   GOALS: Goals reviewed with patient? Yes  SHORT TERM GOALS: Target date: 10/31/23 Patient will be independent with performance of HEP to demonstrate adequate self management of symptoms.  Baseline:  Goal status: MET  2.   Patient will report at least a 25% improvement with function and/or pain reduction overall since beginning PT. Baseline:  Goal status: IN PROGRESS (10%)   LONG TERM GOALS: Target date: 11/21/23 Patient will improve ABC Scale score by 10% to demonstrate improved perceived function while meeting [facility]ID.  Baseline: Goal status: IN PROGRESS (4% improvement)  2.  Patient will be able to statically stand at least 2 minutes without UE support to demonstrate improved standing tolerance needed for  community ambulation.  Baseline:  Goal status: IN PROGRESS (less than 30 seconds)  3.  Patient will score at least a  4/5 on  LLE MMT to demonstrate increased LE strength and/or power needed to improve  ambulation/gait mechanics.  Baseline:  Goal status: IN PROGRESS   4.  Patient will increase 2 minute walk test gait distance to 30 ft with least restrictive assistive device to demonstrate improved endurance and functional mobility needed for ambulating household and community distances.  Baseline: Goal status: IN PROGRESS  5. Patient will be able to maintain tandem balance bilaterally for at least 10 seconds without UE support to demonstrate improved balance needed to demonstrate decreased fall risk.  Baseline: Goal status:PARTLY MET (have met with Lt leading but not Rt)   PLAN:  PT FREQUENCY: 2x/week  PT DURATION: 6 weeks  PLANNED INTERVENTIONS: 97164- PT Re-evaluation, 97110-Therapeutic exercises, 97530- Therapeutic activity, [id number]- Neuromuscular re-education, 97535- Self Care, [id number]- Manual therapy, [id number]- Gait training, [phone]- Electrical stimulation (manual), [id number]- Traction (mechanical), 20560 (1-2 muscles), 20561 (3+ muscles)- Dry Needling, Patient/Family education, Balance training, Stair training, Taping, Joint mobilization, Spinal mobilization, Cryotherapy, and Moist heat  PLAN FOR NEXT SESSION: continue to progress LE and Postural strength, static and dynamic balance, activity tolerance.   F/U with new HEP compliance, bird dogs in quadruped   Greig KATHEE Fuse, PTA/CLT [facility] Ph: [phone]  2:25 PM, 11/26/23

## 2023-11-28 ENCOUNTER — Ambulatory Visit (HOSPITAL_COMMUNITY)

## 2023-11-28 ENCOUNTER — Encounter (HOSPITAL_COMMUNITY): Payer: Self-pay

## 2023-11-28 DIAGNOSIS — Z7409 Other reduced mobility: Secondary | ICD-10-CM

## 2023-11-28 DIAGNOSIS — M6281 Muscle weakness (generalized): Secondary | ICD-10-CM

## 2023-11-28 NOTE — Therapy (Signed)
 OUTPATIENT PHYSICAL THERAPY LOWER EXTREMITY TREATMENT  Patient Name: Emma Henderson MRN: [id number] DOB:30-Apr-1945, 78 y.o., female Today's Date: 11/28/2023  END OF SESSION:  PT End of Session - 11/28/23 1115     Visit Number 12    Number of Visits 13    Date for Recertification  12/16/23    Authorization Type [facility]    Authorization Time Period no auth    Progress Note Due on Visit 13   Progress note complete visit #7   PT Start Time 1118    PT Stop Time 1156    PT Time Calculation (min) 38 min    Equipment Utilized During Treatment Gait belt    Activity Tolerance Patient tolerated treatment well;Patient limited by fatigue    Behavior During Therapy WFL for tasks assessed/performed                Past Medical History:  Diagnosis Date   Arthritis    hands (11/04/2012)   Cataract    Diverticulosis 2006   Gait abnormality 06/27/2020   Headache(784.0)    probably monthly 11/04/2012)   Osteopenia 05/2017   T score -1.8 FRAX 11.6% / 2.3%   Perforated diverticulitis 07/04/2012   Umbilical hernia 07/04/2012   Unsteady gait    Urinary incontinence    Past Surgical History:  Procedure Laterality Date   CATARACT EXTRACTION W/PHACO Left 12/11/2021   Procedure: CATARACT EXTRACTION PHACO AND INTRAOCULAR LENS PLACEMENT (IOC);  Surgeon: Juli Blunt, MD;  Location: [facility];  Service: Ophthalmology;  Laterality: Left;  CDE: 12.21   CATARACT EXTRACTION W/PHACO Right 12/28/2021   Procedure: CATARACT EXTRACTION PHACO AND INTRAOCULAR LENS PLACEMENT (IOC);  Surgeon: Juli Blunt, MD;  Location: [facility];  Service: Ophthalmology;  Laterality: Right;  CDE: 13.13   CERVICAL SPINE SURGERY  ~ 1997   COLON SURGERY     HERNIA REPAIR  11/04/2012   umbilical   LAPAROSCOPIC PARTIAL COLECTOMY N/A 11/04/2012   Procedure: LAPAROSCOPIC SIGMOID COLECTOMY;  Surgeon: Vicenta DELENA Poli, MD;  Location: [facility];  Service: General;  Laterality: N/A;   LAPAROSCOPIC SIGMOID COLECTOMY   11/04/2012   ABSCESS DIVERTICULAR      LUMBAR SPINE SURGERY  ~ 1987   PROCTOPLASTY N/A 11/04/2012   Procedure: RIGID PROCTOSCOPY;  Surgeon: Vicenta DELENA Poli, MD;  Location: [facility];  Service: General;  Laterality: N/A;   Patient Active Problem List   Diagnosis Date Noted   Small vessel disease, cerebrovascular 01/04/2021   Gait abnormality 06/27/2020   Diverticulitis of intestine with perforation 07/04/2012   Osteopenia 07/04/2012   Umbilical hernia 07/04/2012    PCP: Sheryle Carwin, MD  REFERRING PROVIDER: Sheryle Carwin, MD  REFERRING DIAG:  gait disorder, imbalance    THERAPY DIAG:  Muscle weakness (generalized)  Impaired functional mobility, balance, gait, and endurance  Rationale for Evaluation and Treatment: Rehabilitation  ONSET DATE: Chronic, years  SUBJECTIVE:   SUBJECTIVE STATEMENT: Pt stated she feels good today, feels she can stand better.  Balance is still an issue.  Walked a mile and half this morning with dog.    Eval:  Patient reports she is here for her balance issues. Reports she has fallen once in last 6 months but she trips a lot and has some near misses. Reports she has some leg pain once standing in static position, LLE>RLE. Walking is fine as long as its not too far. She walks fast so she wont have to be on legs for long amount of time. Reports  she developed scoliosis over time. Reports balance has been an ongoing issue but reports It has gotten worse recently  PERTINENT HISTORY: 2 previous spine surgeries: one in cervical and low back, 1980s osteopenia  PAIN:  Are you having pain? No  PRECAUTIONS: None  RED FLAGS: None   WEIGHT BEARING RESTRICTIONS: No  FALLS:  Has patient fallen in last 6 months? Yes. Number of falls last fall June 19th, most falls due to tripping secondary to not picking up her feet  LIVING ENVIRONMENT: Lives with: lives alone Lives in: House/apartment Stairs: Yes: Internal: 12-13 steps; on left going up Has following  equipment at home: Single point cane, does not use  OCCUPATION: Retired Runner, Broadcasting/film/video.    PLOF: Independent  PATIENT GOALS: I wish I could be more comfortable standing, and standing straighter  NEXT MD VISIT: Not set.   OBJECTIVE:  Note: Objective measures were completed at Evaluation unless otherwise noted.  DIAGNOSTIC FINDINGS: N/A  PATIENT SURVEYS:  ABC scale: The Activities-Specific Balance Confidence (ABC) Scale 0% 10 20 30  40 50 60 70 80 90 100% No confidence<->completely confident  "How confident are you that you will not lose your balance or become unsteady when you . . .   Total ABC score: evaluation: 920 / 1600 = 57.5 % 11/11/23:  980 / 1600 = 61.3 %  COGNITION: Overall cognitive status: Within functional limits for tasks assessed     SENSATION: Light touch: Impaired  Dec sensation on L L1-2 dermatomes   POSTURE: rounded shoulders, forward head, flexed trunk , and laterally flexed to R   PALPATION:   LLOWER EXTREMITY MMT:  MMT Right eval Left eval  Hip flexion 4 4-  Hip extension 4 4-  Hip abduction 4 4-  Hip adduction    Hip internal rotation    Hip external rotation    Knee flexion    Knee extension    Ankle dorsiflexion 5 5  Ankle plantarflexion    Ankle inversion    Ankle eversion     (Blank rows = not tested)  LOWER EXTREMITY SPECIAL TESTS:    FUNCTIONAL TESTS:  5 times sit to stand: 11 first trial, 8 second trial  Timed up and go (TUG): 9 no AD   2 minute walk test: 10/13/23: 483 ft no AD   10/13/23: Four Stage Balance:  Side by side: 30, BLE very shaky due to fatigue, 10/13/23: 11.06 prior need for UE support Semi tandem: 9 with RLE leading     7.53 With Lt LE leading Tandem: 25 with RLE leading, very shaky due to fatigue    3 Lt LE leading SL: Lt 3 Rt 4   11/11/23:  5 times sit to stand: 8.7 no UE (was  11 first trial, 8 second trial)  Timed up and go (TUG): 7.46 (was 9 no AD)   2 minute walk test: 10/13/23: 452 feet  with larger stride, improved posturing, safer gait no AD (was 483 ft no AD)  Four Stage Balance:  Feet together 23 very shaky due to fatigue (10/13/23: 11.06 prior need for UE support)  tandem: 7.11 (was 9) with RLE leading      11.8 with forward leaning (was 7.53) With Lt LE leading Single leg stance:  Lt: 6 Rt:8  (was Lt 3  Rt 4)    GAIT: Distance walked: 75 ft in session  Assistive device utilized: None Level of assistance: Complete Independence Comments: slightly inc gait velocity, lateral lean to R due to scoliosis,  dec stance time in LLE                                                                                                                                TREATMENT DATE:  11/28/23: PWR reach 5x  PWR Up with blue ball, eccentric control to heel raise 10x 3 PWR Twist 5-6 reps  Standing facing mirror with 3# bar UE flexion x 1 minute Sidestep 4RT front of mat  RTB shoulder extension 10x 3 RTB rows 10x3  Toe Tapping 6in step 1 HHA alternating 20x  Quadruped BirdDog 5x 5  11/23/23 Standing: cone rotation (8) transferring Lt-Rt, Rt-Lt 2X normal stance with SBA, no UE as able  Rt leading max 10 X 3 trials, Lt leading max 8 X 3 trials  RTB shoulder extension 2X10 with CGA  RTB rows 2X10 with CGA Sit to stands, slow descent with 5# KB  11/20/23:  Quadruped: Bird dog 2 set 5x 5 STS with cueing for gluteal engangament and posture 2x 10 Gait slow cadence with cueing for posture and arm swing 2x 31ft  Seated rest break with lateral thoracic sidebend 5RT  RTB shoulder extension 10 x 2 sets RTB rows 6in step up and over with UE support  Toe tapping 6in 10x  10 cone rotation with cueing for posture, slow down and improve reaching outside BOS  11/18/23: Standing:  all completed with CGA 10 Cone rotation cueing to slow down and stand tall, 2 RT inside // bars with frequent HHA but encouragement not to use UE 10 STS with cueing for hip thrust for gluteal  engangement and posture 2nd set with NBOS 10x Sidestep in // bars 5RT with short standing rest at end of each lap Retro gait inside bars no UE assist 3RT   6 lateral step up and overs with bil UE assist 5RT  11/13/23: Standing: -10 Cone rotation cueing to slow down and stand tall -10 STS with cueing for hip thrust for gluteal engangement and posture 2nd set with NBOS 10x -Sidestep front of mat with RTB 5RT required seated rest break following each set.   Supine: -Bridge with feet on green ball 2x 10 -Bridge with feet on green ball with hamstring curls Sidelying:  -abduction 15 -Postural strengthening with RTB Rows      RTB shoulder extension 10x -Vector stance 3x 5 with BUE assistance (attempted 1 HHA)  11/11/23  progress note 5 times sit to stand: 8.7 no UE (was  11 first trial, 8 second trial)  Timed up and go (TUG): 7.46 (was 9 no AD)   2 minute walk test: 10/13/23: 452 feet with larger stride, improved posturing, safer gait no AD (was 483 ft no AD)  Four Stage Balance:  Feet together 23 very shaky due to fatigue (10/13/23: 11.06 prior need for UE support)  tandem: 7.11 (was 9) with RLE leading      11.8 with forward leaning (was 7.53)  With Lt LE leading Single leg stance:  Lt: 6 Rt:8  (was Lt 3  Rt 4) Standing:  vectors 5X3 each side with 1 UE assist  ABC confidence:  980 / 1600 = 61.3 % (was 920 / 1600 = 57.5 %) DGI 1. Gait level surface (3) Normal: Walks 20', no assistive devices, good sped, no evidence for imbalance, normal gait pattern 2. Change in gait speed (3) Normal: Able to smoothly change walking speed without loss of balance or gait deviation. Shows a significant difference in walking speeds between normal, fast and slow speeds. 3. Gait with horizontal head turns (3) Normal: Performs head turns smoothly with no change in gait. 4. Gait with vertical head turns (3) Normal: Performs head turns smoothly with no change in gait. 5. Gait and pivot turn (3)  Normal: Pivot turns safely within 3 seconds and stops quickly with no loss of balance. 6. Step over obstacle (3) Normal: Is able to step over the box without changing gait speed, no evidence of imbalance. 7. Step around obstacles (3) Normal: Is able to walk around cones safely without changing gait speed; no evidence of imbalance. 8. Stairs (2) Mild Impairment: Alternating feet, must use rail.  TOTAL SCORE: 23 / 24   PATIENT EDUCATION:  Education details: progression Person educated: Patient Education method: Explanation Education comprehension: verbalized understanding  HOME EXERCISE PROGRAM: Access Code: [email or web address] URL: [email or web address] Date: 10/10/2023 Prepared by: Rosaria Powell-Butler  Exercises - Supine Bridge  - 2 x daily - 7 x weekly - 2 sets - 10 reps - 5 hold - Sidelying Hip Abduction  - 2 x daily - 7 x weekly - 2 sets - 10 reps - Sit to Stand  - 2 x daily - 7 x weekly - 3 sets - 10 reps - Supine Transversus Abdominis Bracing - Hands on Stomach  - 2 x daily - 7 x weekly - 2 sets - 10 reps - 10 hold - Standing Tandem Balance with Counter Support  - 2 x daily - 7 x weekly - 3 sets - 30 hold  11/13/23: - Static standing front of counter, awareness of posture and removal of UE support as able.  11/20/23: [employer] Chang Dog  - 2 x daily - 7 x weekly - 3 sets - 5 reps - 5 hold  ASSESSMENT:  CLINICAL IMPRESSION: Added PWR activities for proximal strengthening, improve balance and reaching outside BOS.  Pt continues to have difficulty with static standing, presents with large fear component reaching for objects and requesting to have a seat following up to or less than 1 minute  duration, visible musculature fatigue/trembling in LE and increased trunk flexion.  Cueing required through session to improve awareness of posture with verbal and tactile cueing.  Used mirror feedback to improve awareness of posture.  No reports of pain, was limited by fatigue.  Eval:   Patient is a 78 y.o. female who was seen today for physical therapy evaluation and treatment for  gait disorder, imbalance  . On this date, patient demonstrates impaired self perception of balance via ABC scale, decreased LE strength (L>R), poor posture, dec endurance, and impaired balance, all of which may be contributing to patients decreased activity tolerance, impaired gait, and fall risk. Patient will benefit from continued skilled physical therapy in order to address these deficits/impairments to improve overall function and quality of life.    OBJECTIVE IMPAIRMENTS: Abnormal gait, decreased activity tolerance, decreased balance, decreased coordination, decreased endurance, decreased mobility, difficulty walking, decreased ROM,  decreased strength, impaired flexibility, improper body mechanics, postural dysfunction, and pain.   ACTIVITY LIMITATIONS: carrying, lifting, bending, sitting, standing, squatting, stairs, transfers, and reach over head  PARTICIPATION LIMITATIONS: meal prep, cleaning, laundry, community activity, and yard work  PERSONAL FACTORS: Time since onset of injury/illness/exacerbation are also affecting patient's functional outcome.   REHAB POTENTIAL: Good  CLINICAL DECISION MAKING: Stable/uncomplicated  EVALUATION COMPLEXITY: Low   GOALS: Goals reviewed with patient? Yes  SHORT TERM GOALS: Target date: 10/31/23 Patient will be independent with performance of HEP to demonstrate adequate self management of symptoms.  Baseline:  Goal status: MET  2.   Patient will report at least a 25% improvement with function and/or pain reduction overall since beginning PT. Baseline:  Goal status: IN PROGRESS (10%)   LONG TERM GOALS: Target date: 11/21/23 Patient will improve ABC Scale score by 10% to demonstrate improved perceived function while meeting MCID.  Baseline: Goal status: IN PROGRESS (4% improvement)  2.  Patient will be able to statically stand at least 2  minutes without UE support to demonstrate improved standing tolerance needed for community ambulation.  Baseline:  Goal status: IN PROGRESS (less than 30 seconds)  3.  Patient will score at least a  4/5 on  LLE MMT to demonstrate increased LE strength and/or power needed to improve ambulation/gait mechanics.  Baseline:  Goal status: IN PROGRESS   4.  Patient will increase 2 minute walk test gait distance to 30 ft with least restrictive assistive device to demonstrate improved endurance and functional mobility needed for ambulating household and community distances.  Baseline: Goal status: IN PROGRESS  5. Patient will be able to maintain tandem balance bilaterally for at least 10 seconds without UE support to demonstrate improved balance needed to demonstrate decreased fall risk.  Baseline: Goal status:PARTLY MET (have met with Lt leading but not Rt)   PLAN:  PT FREQUENCY: 2x/week  PT DURATION: 6 weeks  PLANNED INTERVENTIONS: 97164- PT Re-evaluation, 97110-Therapeutic exercises, 97530- Therapeutic activity, [id number]- Neuromuscular re-education, 97535- Self Care, [id number]- Manual therapy, [id number]- Gait training, [phone]- Electrical stimulation (manual), [id number]- Traction (mechanical), 20560 (1-2 muscles), 20561 (3+ muscles)- Dry Needling, Patient/Family education, Balance training, Stair training, Taping, Joint mobilization, Spinal mobilization, Cryotherapy, and Moist heat  PLAN FOR NEXT SESSION: continue to progress LE and Postural strength, static and dynamic balance, activity tolerance.   F/U with new HEP compliance, bird dogs in quadruped   [place], LPTA/CLT; CBIS [phone]   1:44 PM, 11/28/23

## 2023-12-03 ENCOUNTER — Encounter (HOSPITAL_COMMUNITY)

## 2023-12-04 ENCOUNTER — Ambulatory Visit (HOSPITAL_COMMUNITY)

## 2023-12-04 ENCOUNTER — Encounter (HOSPITAL_COMMUNITY): Payer: Self-pay

## 2023-12-04 DIAGNOSIS — M6281 Muscle weakness (generalized): Secondary | ICD-10-CM | POA: Diagnosis not present

## 2023-12-04 DIAGNOSIS — Z7409 Other reduced mobility: Secondary | ICD-10-CM

## 2023-12-04 NOTE — Therapy (Signed)
 OUTPATIENT PHYSICAL THERAPY LOWER EXTREMITY TREATMENT PHYSICAL THERAPY DISCHARGE SUMMARY  Visits from Start of Care: 12  Current functional level related to goals / functional outcomes: MET   Remaining deficits: NONE   Education / Equipment: See below   Patient agrees to discharge. Patient goals were met. Patient is being discharged due to meeting the stated rehab goals.  Patient Name: Emma Henderson MRN: [id number] DOB:1946-01-05, 78 y.o., female Today's Date: 12/04/2023  END OF SESSION:  PT End of Session - 12/04/23 0813     Visit Number 13    Number of Visits 13    Date for Recertification  12/16/23    Authorization Type [facility]    Authorization Time Period no auth    Progress Note Due on Visit 13    PT Start Time 0814    PT Stop Time 0855    PT Time Calculation (min) 41 min    Equipment Utilized During Treatment Gait belt    Activity Tolerance Patient tolerated treatment well;Patient limited by fatigue    Behavior During Therapy [facility] for tasks assessed/performed                 Past Medical History:  Diagnosis Date   Arthritis    hands (11/04/2012)   Cataract    Diverticulosis 2006   Gait abnormality 06/27/2020   Headache(784.0)    probably monthly 11/04/2012)   Osteopenia 05/2017   T score -1.8 FRAX 11.6% / 2.3%   Perforated diverticulitis 07/04/2012   Umbilical hernia 07/04/2012   Unsteady gait    Urinary incontinence    Past Surgical History:  Procedure Laterality Date   CATARACT EXTRACTION W/PHACO Left 12/11/2021   Procedure: CATARACT EXTRACTION PHACO AND INTRAOCULAR LENS PLACEMENT (IOC);  Surgeon: Juli Blunt, MD;  Location: [facility];  Service: Ophthalmology;  Laterality: Left;  CDE: 12.21   CATARACT EXTRACTION W/PHACO Right 12/28/2021   Procedure: CATARACT EXTRACTION PHACO AND INTRAOCULAR LENS PLACEMENT (IOC);  Surgeon: Juli Blunt, MD;  Location: [facility];  Service: Ophthalmology;  Laterality: Right;  CDE: 13.13    CERVICAL SPINE SURGERY  ~ 1997   COLON SURGERY     HERNIA REPAIR  11/04/2012   umbilical   LAPAROSCOPIC PARTIAL COLECTOMY N/A 11/04/2012   Procedure: LAPAROSCOPIC SIGMOID COLECTOMY;  Surgeon: Vicenta DELENA Poli, MD;  Location: [facility];  Service: General;  Laterality: N/A;   LAPAROSCOPIC SIGMOID COLECTOMY  11/04/2012   ABSCESS DIVERTICULAR      LUMBAR SPINE SURGERY  ~ 1987   PROCTOPLASTY N/A 11/04/2012   Procedure: RIGID PROCTOSCOPY;  Surgeon: Vicenta DELENA Poli, MD;  Location: [facility] OR;  Service: General;  Laterality: N/A;   Patient Active Problem List   Diagnosis Date Noted   Small vessel disease, cerebrovascular 01/04/2021   Gait abnormality 06/27/2020   Diverticulitis of intestine with perforation 07/04/2012   Osteopenia 07/04/2012   Umbilical hernia 07/04/2012    PCP: Sheryle Carwin, MD  REFERRING PROVIDER: Sheryle Carwin, MD  REFERRING DIAG:  gait disorder, imbalance    THERAPY DIAG:  Muscle weakness (generalized)  Impaired functional mobility, balance, gait, and endurance  Rationale for Evaluation and Treatment: Rehabilitation  ONSET DATE: Chronic, years  SUBJECTIVE:   SUBJECTIVE STATEMENT: Pt states she still struggling with balance but feels it has improved. No falls, since June. Pt states she has been doing some of the HEP, feels about 30% better.  Eval:  Patient reports she is here for her balance issues. Reports she has fallen once in last  6 months but she trips a lot and has some near misses. Reports she has some leg pain once standing in static position, LLE>RLE. Walking is fine as long as its not too far. She walks fast so she wont have to be on legs for long amount of time. Reports she developed scoliosis over time. Reports balance has been an ongoing issue but reports It has gotten worse recently  PERTINENT HISTORY: 2 previous spine surgeries: one in cervical and low back, 1980s osteopenia  PAIN:  Are you having pain? No  PRECAUTIONS: None  RED  FLAGS: None   WEIGHT BEARING RESTRICTIONS: No  FALLS:  Has patient fallen in last 6 months? Yes. Number of falls last fall June 19th, most falls due to tripping secondary to not picking up her feet  LIVING ENVIRONMENT: Lives with: lives alone Lives in: House/apartment Stairs: Yes: Internal: 12-13 steps; on left going up Has following equipment at home: Single point cane, does not use  OCCUPATION: Retired Runner, Broadcasting/film/video.    PLOF: Independent  PATIENT GOALS: I wish I could be more comfortable standing, and standing straighter  NEXT MD VISIT: Not set.   OBJECTIVE:  Note: Objective measures were completed at Evaluation unless otherwise noted.  DIAGNOSTIC FINDINGS: N/A  PATIENT SURVEYS:  ABC scale: The Activities-Specific Balance Confidence (ABC) Scale 0% 10 20 30  40 50 60 70 80 90 100% No confidence<->completely confident  "How confident are you that you will not lose your balance or become unsteady when you . . .   Total ABC score: evaluation: 920 / 1600 = 57.5 % 11/11/23:  980 / 1600 = 61.3 %  Total ABC: 1240 / 1600 = 77.5 % COGNITION: Overall cognitive status: Within functional limits for tasks assessed     SENSATION: Light touch: Impaired  Dec sensation on L L1-2 dermatomes   POSTURE: rounded shoulders, forward head, flexed trunk , and laterally flexed to R   PALPATION:   LLOWER EXTREMITY MMT:  MMT Right eval Left eval Right 12/04/23 Left 12/04/23  Hip flexion 4 4- 4+ 4+  Hip extension 4 4- 4+ 4+  Hip abduction 4 4- 4+ 4+  Hip adduction      Hip internal rotation      Hip external rotation      Knee flexion      Knee extension      Ankle dorsiflexion 5 5 5 5   Ankle plantarflexion      Ankle inversion      Ankle eversion       (Blank rows = not tested)  LOWER EXTREMITY SPECIAL TESTS:    FUNCTIONAL TESTS:  5 times sit to stand: 11 first trial, 8 second trial  Timed up and go (TUG): 9 no AD   2 minute walk test: 10/13/23: 483 ft no AD    10/13/23: Four Stage Balance:  Side by side: 30, BLE very shaky due to fatigue, 10/13/23: 11.06 prior need for UE support Semi tandem: 9 with RLE leading     7.53 With Lt LE leading Tandem: 25 with RLE leading, very shaky due to fatigue    3 Lt LE leading SL: Lt 3 Rt 4   11/11/23:  5 times sit to stand: 8.7 no UE (was  11 first trial, 8 second trial)  Timed up and go (TUG): 7.46 (was 9 no AD)   2 minute walk test: 10/13/23: 452 feet with larger stride, improved posturing, safer gait no AD (was 483 ft no AD)  Four  Stage Balance:  Feet together 23 very shaky due to fatigue (10/13/23: 11.06 prior need for UE support)  tandem: 7.11 (was 9) with RLE leading      11.8 with forward leaning (was 7.53) With Lt LE leading Single leg stance:  Lt: 6 Rt:8  (was Lt 3  Rt 4)  12/04/23: : 527 feet, no assistive device   GAIT: Distance walked: 75 ft in session  Assistive device utilized: None Level of assistance: Complete Independence Comments: slightly inc gait velocity, lateral lean to R due to scoliosis, dec stance time in LLE                                                                                                                                TREATMENT DATE:  12/04/2023  Goals tracked, pt educated on findings and continued HEP compliance. Pt education on community resources for continued progression and role of PT. Pt also educated on referral process should she be in need of therapy services again.   11/28/23: PWR reach 5x  PWR Up with blue ball, eccentric control to heel raise 10x 3 PWR Twist 5-6 reps  Standing facing mirror with 3# bar UE flexion x 1 minute Sidestep 4RT front of mat  RTB shoulder extension 10x 3 RTB rows 10x3  Toe Tapping 6in step 1 HHA alternating 20x  Quadruped BirdDog 5x 5  11/23/23 Standing: cone rotation (8) transferring Lt-Rt, Rt-Lt 2X normal stance with SBA, no UE as able  Rt leading max 10 X 3 trials, Lt leading max  8 X 3 trials  RTB shoulder extension 2X10 with CGA  RTB rows 2X10 with CGA Sit to stands, slow descent with 5# KB   PATIENT EDUCATION:  Education details: progression Person educated: Patient Education method: Explanation Education comprehension: verbalized understanding  HOME EXERCISE PROGRAM: Access Code: [email or web address] URL: [email or web address] Date: 12/04/2023 Prepared by: Lang Ada  Exercises - Supine Bridge  - 2 x daily - 7 x weekly - 3 sets - 10 reps - 5 hold - Sit to Stand  - 2 x daily - 7 x weekly - 3 sets - 10 reps - Supine Transversus Abdominis Bracing - Hands on Stomach  - 2 x daily - 7 x weekly - 2 sets - 10 reps - 10 hold - Standing Tandem Balance with Counter Support  - 2 x daily - 7 x weekly - 3 sets - 30 hold - Bird Dog  - 2 x daily - 7 x weekly - 3 sets - 5 reps - 5 hold - Hip Abduction with Resistance Loop  - 1 x daily - 7 x weekly - 3 sets - 10 reps - Hip Extension with Resistance Loop  - 1 x daily - 7 x weekly - 3 sets - 10 reps - Standing Hip Flexion with Resistance Loop  - 1 x daily - 7 x weekly - 3 sets - 10 reps - Side Stepping with Resistance  at Ankles  - 1 x daily - 7 x weekly - 3 sets - 10 reps  ASSESSMENT:  CLINICAL IMPRESSION: Patient continues to demonstrate increased LE strength, improved gait quality and balance. Patient also demonstrates decreased endurance with aerobic based exercise during today's session with . Patient able to continue dynamic balance and core activation exercises today with good performance with verbal cueing, updated HEP and therabands given. Patient to be discharged this visit due to meeting all rehab goals.    Eval:  Patient is a 78 y.o. female who was seen today for physical therapy evaluation and treatment for  gait disorder, imbalance  . On this date, patient demonstrates impaired self perception of balance via ABC scale, decreased LE strength (L>R), poor posture, dec endurance, and impaired balance,  all of which may be contributing to patients decreased activity tolerance, impaired gait, and fall risk. Patient will benefit from continued skilled physical therapy in order to address these deficits/impairments to improve overall function and quality of life.    OBJECTIVE IMPAIRMENTS: Abnormal gait, decreased activity tolerance, decreased balance, decreased coordination, decreased endurance, decreased mobility, difficulty walking, decreased ROM, decreased strength, impaired flexibility, improper body mechanics, postural dysfunction, and pain.   ACTIVITY LIMITATIONS: carrying, lifting, bending, sitting, standing, squatting, stairs, transfers, and reach over head  PARTICIPATION LIMITATIONS: meal prep, cleaning, laundry, community activity, and yard work  PERSONAL FACTORS: Time since onset of injury/illness/exacerbation are also affecting patient's functional outcome.   REHAB POTENTIAL: Good  CLINICAL DECISION MAKING: Stable/uncomplicated  EVALUATION COMPLEXITY: Low   GOALS: Goals reviewed with patient? Yes  SHORT TERM GOALS: Target date: 10/31/23 Patient will be independent with performance of HEP to demonstrate adequate self management of symptoms.  Baseline:  Goal status: MET  2.   Patient will report at least a 25% improvement with function and/or pain reduction overall since beginning PT. Baseline:  Goal status: MET (10%)   LONG TERM GOALS: Target date: 11/21/23 Patient will improve ABC Scale score by 10% to demonstrate improved perceived function while meeting MCID.  Baseline: Goal status: MET  2.  Patient will be able to statically stand at least 2 minutes without UE support to demonstrate improved standing tolerance needed for community ambulation.  Baseline:  Goal status: MET (less than 30 seconds)  3.  Patient will score at least a  4/5 on  LLE MMT to demonstrate increased LE strength and/or power needed to improve ambulation/gait mechanics.  Baseline:  Goal status:  MET   4.  Patient will increase 2 minute walk test gait distance to 30 ft with least restrictive assistive device to demonstrate improved endurance and functional mobility needed for ambulating household and community distances.  Baseline: Goal status: MET  5. Patient will be able to maintain tandem balance bilaterally for at least 10 seconds without UE support to demonstrate improved balance needed to demonstrate decreased fall risk.  Baseline: Goal status: MET    PLAN:  PT FREQUENCY: 2x/week  PT DURATION: 6 weeks  PLANNED INTERVENTIONS: 97164- PT Re-evaluation, 97110-Therapeutic exercises, 97530- Therapeutic activity, 97112- Neuromuscular re-education, 97535- Self Care, [id number]- Manual therapy, [phone]- Gait training, [phone]- Electrical stimulation (manual), [id number]- Traction (mechanical), [phone] (1-2 muscles), [id number] (3+ muscles)- Dry Needling, Patient/Family education, Balance training, Stair training, Taping, Joint mobilization, Spinal mobilization, Cryotherapy, and Moist heat  PLAN FOR NEXT SESSION: discharged   Lang Ada, PT, DPT [facility] Office: [phone] 4:42 PM, 12/04/23

## 2023-12-09 ENCOUNTER — Ambulatory Visit (HOSPITAL_COMMUNITY)

## 2023-12-16 ENCOUNTER — Ambulatory Visit (HOSPITAL_COMMUNITY)

## 2023-12-24 ENCOUNTER — Ambulatory Visit (HOSPITAL_COMMUNITY): Admitting: Physical Therapy

## 2023-12-26 ENCOUNTER — Ambulatory Visit (HOSPITAL_COMMUNITY)

## 2023-12-29 ENCOUNTER — Ambulatory Visit (HOSPITAL_COMMUNITY): Admitting: Physical Therapy

## 2024-01-02 ENCOUNTER — Ambulatory Visit (HOSPITAL_COMMUNITY)

## 2024-01-05 ENCOUNTER — Ambulatory Visit (HOSPITAL_COMMUNITY): Admitting: Physical Therapy

## 2024-01-08 ENCOUNTER — Ambulatory Visit (HOSPITAL_COMMUNITY)

## 2024-02-11 NOTE — Therapy (Signed)
 " OUTPATIENT PHYSICAL THERAPY LOWER EXTREMITY EVALUATION   Patient Name: Emma Henderson MRN: [id number] DOB:04/07/1945, 79 y.o., female Today's Date: 02/12/2024  END OF SESSION:  PT End of Session - 02/12/24 1618     Visit Number 1    Date for Recertification  03/25/24    Authorization Type [facility]    Authorization Time Period no auth    Progress Note Due on Visit 10    PT Start Time 0945    PT Stop Time 1028    PT Time Calculation (min) 43 min    Equipment Utilized During Treatment Gait belt    Activity Tolerance Patient tolerated treatment well;Patient limited by fatigue    Behavior During Therapy [facility] for tasks assessed/performed;Anxious          Past Medical History:  Diagnosis Date   Arthritis    hands (11/04/2012)   Cataract    Diverticulosis 2006   Gait abnormality 06/27/2020   Headache(784.0)    probably monthly 11/04/2012)   Osteopenia 05/2017   T score -1.8 FRAX 11.6% / 2.3%   Perforated diverticulitis 07/04/2012   Umbilical hernia 07/04/2012   Unsteady gait    Urinary incontinence    Past Surgical History:  Procedure Laterality Date   CATARACT EXTRACTION W/PHACO Left 12/11/2021   Procedure: CATARACT EXTRACTION PHACO AND INTRAOCULAR LENS PLACEMENT (IOC);  Surgeon: Juli Blunt, MD;  Location: [facility];  Service: Ophthalmology;  Laterality: Left;  CDE: 12.21   CATARACT EXTRACTION W/PHACO Right 12/28/2021   Procedure: CATARACT EXTRACTION PHACO AND INTRAOCULAR LENS PLACEMENT (IOC);  Surgeon: Juli Blunt, MD;  Location: [facility];  Service: Ophthalmology;  Laterality: Right;  CDE: 13.13   CERVICAL SPINE SURGERY  ~ 1997   COLON SURGERY     HERNIA REPAIR  11/04/2012   umbilical   LAPAROSCOPIC PARTIAL COLECTOMY N/A 11/04/2012   Procedure: LAPAROSCOPIC SIGMOID COLECTOMY;  Surgeon: Vicenta DELENA Poli, MD;  Location: [facility];  Service: General;  Laterality: N/A;   LAPAROSCOPIC SIGMOID COLECTOMY  11/04/2012   ABSCESS DIVERTICULAR      LUMBAR SPINE  SURGERY  ~ 1987   PROCTOPLASTY N/A 11/04/2012   Procedure: RIGID PROCTOSCOPY;  Surgeon: Vicenta DELENA Poli, MD;  Location: [facility];  Service: General;  Laterality: N/A;   Patient Active Problem List   Diagnosis Date Noted   Small vessel disease, cerebrovascular 01/04/2021   Gait abnormality 06/27/2020   Diverticulitis of intestine with perforation 07/04/2012   Osteopenia 07/04/2012   Umbilical hernia 07/04/2012    PCP: Sheryle Carwin, MD  REFERRING PROVIDER: Sheryle Carwin, MD  REFERRING DIAG: gait disorder, imbalance  THERAPY DIAG:  Gait instability  Muscle weakness (generalized)  Impaired functional mobility, balance, gait, and endurance  Rationale for Evaluation and Treatment: Rehabilitation  ONSET DATE: about a month ago  SUBJECTIVE:   SUBJECTIVE STATEMENT: Eval: Pt states just standing and unsteadiness on feet  has persisted over the past two months, states she is a little worse since she stopped therapy about 2 months ago. Pt states she has been really careful and has not had any falls since last episode of care. Pt states she is about to move into a handicapped apartment, only one little step to get to porch. Pt states she bought a cane but did not like it very much. Pt states she feels more unbalanced with tight spaces in the home, feels fine outside. Pt states her confidence has slipped since therapy ended last time.  PERTINENT HISTORY: 2 back surgeries Colon  surgery  PAIN:  Are you having pain? No  PRECAUTIONS: Fall  RED FLAGS: None   WEIGHT BEARING RESTRICTIONS: No  FALLS:  Has patient fallen in last 6 months? No  LIVING ENVIRONMENT: Lives with: lives alone Lives in: House/apartment Stairs: 1 tiny step to porch, 2 steps to get inside can hold onto the door Has following equipment at home: Single point cane  OCCUPATION: retired  PLOF: Independent and Independent with basic ADLs  PATIENT GOALS: improve balance, stand without feeling uncomfortable, would  like to be able to carry plates and stuff while walking  NEXT MD VISIT: Neurology 03/18/24  OBJECTIVE:  Note: Objective measures were completed at Evaluation unless otherwise noted.  DIAGNOSTIC FINDINGS: CLINICAL DATA:  Gait disorder.  Huntington disease suspected.   EXAM: MRI HEAD WITHOUT CONTRAST   TECHNIQUE: Multiplanar, multiecho pulse sequences of the brain and surrounding structures were obtained without intravenous contrast.   COMPARISON:  MRI of the brain November 04, 2011.   FINDINGS: Brain: No acute infarction, hemorrhage, hydrocephalus, extra-axial collection or mass lesion. Scattered and confluent foci of T2 hyperintensity are seen within the white matter of cerebral hemispheres, nonspecific. No significant volume loss of the caudate nuclei.   Vascular: Normal flow voids.   Skull and upper cervical spine: Normal marrow signal.   Sinuses/Orbits: Negative.   Other: Mild bilateral mastoid effusion.   IMPRESSION: 1. No acute intracranial abnormality. 2. Scattered and confluent foci of T2 hyperintensity within the white matter of the cerebral hemispheres, nonspecific but may represent moderate microvascular ischemic changes. 3. Mild bilateral mastoid effusion.  PATIENT SURVEYS:  ABC scale:  800 / 1600 = 50.0 %   COGNITION: Overall cognitive status: pt states she has trouble remembering words sometimes     SENSATION: Pt states feet hurt some, thinks she might have some neuropathy in toes.  LOWER EXTREMITY ROM:  Active ROM Right eval Left eval  Hip flexion    Hip extension    Hip abduction    Hip adduction    Hip internal rotation    Hip external rotation    Knee flexion    Knee extension    Ankle dorsiflexion    Ankle plantarflexion    Ankle inversion    Ankle eversion     (Blank rows = not tested)  LOWER EXTREMITY MMT:  MMT Right eval Left eval  Hip flexion 4 4  Hip extension 4 4  Hip abduction 4- 4  Hip adduction 3 3  Hip internal  rotation    Hip external rotation    Knee flexion 4+ 4  Knee extension 4+ 4  Ankle dorsiflexion 4+ 4  Ankle plantarflexion    Ankle inversion    Ankle eversion     (Blank rows = not tested)    FUNCTIONAL TESTS:   5TSTS: 9.08 seconds SLS 02/12/2024: R: 1.44 seconds L: 3.24 seconds  FGA:   1. GAIT LEVEL SURFACE: Moderate impairment -- gait level surface (1) (1 points) 2. CHANGE IN GAIT SPEED: Mild impairment -- change in gait speed (2) (2 points) 3. GAIT WITH HORIZONTAL HEAD TURNS: Severe impairment -- gait with horizontal head turns (0) (0 points) 4. GAIT WITH VERTICAL HEAD TURNS: Moderate impairment -- gait with vertical head turns (1) (1 points) 5. GAIT AND PIVOT TURN: Mild impairment -- gait and pivot turn (2) (2 points) 6. STEP OVER OBSTACLE: Moderate impairment -- step over obstacle (1) (1 points) 7. GAIT WITH NARROW BASE OF SUPPORT: Severe impairment -- gait with narrow  base of support (0) (0 points) 8. GAIT WITH EYES CLOSED: Severe impairment -- gait with eyes closed (0) (0 points) 9. AMBULATING BACKWARDS: Normal -- ambulating backwards (3) (3 points) 10. STEPS: Mild impairment -- up and down steps (2) (2 points) Functional Gait Assessment: 12/30=40.0 percent.   GAIT: Distance walked: 100 feet Assistive device utilized: None Level of assistance: Complete Independence Comments: Pt is especially unsteady on her feet especially with more advanced movements during ambulation.                                                                                                                                 TREATMENT DATE:  02/11/2024   Evaluation: -ROM measured, Strength assessed, HEP prescribed, pt educated on prognosis, findings, and importance of HEP compliance if given.         PATIENT EDUCATION:  Education details: Pt was educated on findings of PT evaluation, prognosis, frequency of therapy visits and rationale, attendance policy, and HEP if  given.   Person educated: Patient Education method: Explanation, Verbal cues, and Handouts Education comprehension: verbalized understanding, verbal cues required, and needs further education  HOME EXERCISE PROGRAM: Access Code: [email or web address] URL: [email or web address] Date: 02/12/2024 Prepared by: Lang Ada  Exercises - Tandem Stance with Support  - 1 x daily - 7 x weekly - 1 sets - 3 reps - 30 hold - Standing Marching  - 1 x daily - 7 x weekly - 3 sets - 10 reps - Single Leg Heel Raise with Counter Support  - 1 x daily - 7 x weekly - 3 sets - 10 reps  ASSESSMENT:  CLINICAL IMPRESSION: Patient is a 79 y.o. female who was seen today for physical therapy evaluation and treatment for gait disorder, imbalance.   Patient demonstrates significant balance deficits with advanced gait challenges, decreased LE strength, and fear of falling. Pt able to walk forward into clinic with no AD but consistently reaches for UE support during FGA testing, probable fatiguing as cause. Patient also demonstrates difficulty with ambulation during today's session with slight ataxic pattern, decreased stride length although velocity still WFL. Patient also demonstrates limited tolerance to SLS bilaterally. Patient requires education on prognosis and possible neurological pathology possible, importance of HEP compliance and overall POC. Patient would benefit from skilled physical therapy for decreased fall risk, increased endurance with ambulation, increased LE strength/ROM, and improved balance for improved gait quality, return to higher level of function with ADLs, and progress towards therapy goals.  OBJECTIVE IMPAIRMENTS: Abnormal gait, decreased activity tolerance, decreased balance, decreased endurance, decreased knowledge of condition, decreased knowledge of use of DME, decreased mobility, difficulty walking, decreased ROM, and decreased strength.   ACTIVITY LIMITATIONS: carrying, lifting, bending,  standing, squatting, stairs, and transfers  PARTICIPATION LIMITATIONS: meal prep, cleaning, laundry, shopping, community activity, and yard work  PERSONAL FACTORS: Age, Fitness, Past/current experiences, Time since onset of injury/illness/exacerbation, and 1 comorbidity: 2 back surgeries are also affecting patient's functional  outcome.   REHAB POTENTIAL: Fair chronic in nature  CLINICAL DECISION MAKING: Stable/uncomplicated  EVALUATION COMPLEXITY: Low   GOALS: Goals reviewed with patient? No  SHORT TERM GOALS: Target date: 03/04/24  Patient will demonstrate evidence of independence with individualized HEP and will report compliance for at least 3 days per week for optimized progression towards remaining therapy goals. Baseline:  Goal status: INITIAL  2.  Patient will report no falls for first 3 weeks of therapy for demonstration of safety and decreased risk of falls for improved quality of life. Baseline:  Goal status: INITIAL     LONG TERM GOALS: Target date: 03/25/24  1.  Pt will improve ABC score by at least 15% in order to demonstrate improved pain with functional goals and outcomes. Baseline: see objective.  Goal status: INITIAL   2.  Pt will demonstrate at least 4+/5 MMT for bilateral lower extremities for increased strength during ADL and community ambulation. Baseline: see objective Goal status: INITIAL  3.  Pt will improve SLS by at least 7 seconds bilaterally in order to improve balance during functional activities. Baseline: see objective Goal status: INITIAL     PLAN:  PT FREQUENCY: 2x/week  PT DURATION: 6 weeks  PLANNED INTERVENTIONS: 97110-Therapeutic exercises, 97530- Therapeutic activity, [id number]- Neuromuscular re-education, 97535- Self Care, [id number]- Manual therapy, [phone]- Gait training, Patient/Family education, Balance training, Stair training, Vestibular training, and DME instructions  PLAN FOR NEXT SESSION: progress gait training in tight area,  dynamic SLS balance and LE strengthening, review goals and HEP, revise to pt tolerance and safety.   Lang Ada, PT, DPT [facility] Office: [phone] 4:20 PM, 02/12/24   "

## 2024-02-12 ENCOUNTER — Ambulatory Visit (HOSPITAL_COMMUNITY): Attending: Internal Medicine

## 2024-02-12 ENCOUNTER — Encounter (HOSPITAL_COMMUNITY): Payer: Self-pay

## 2024-02-12 ENCOUNTER — Other Ambulatory Visit: Payer: Self-pay

## 2024-02-12 DIAGNOSIS — Z7409 Other reduced mobility: Secondary | ICD-10-CM | POA: Insufficient documentation

## 2024-02-12 DIAGNOSIS — M6281 Muscle weakness (generalized): Secondary | ICD-10-CM | POA: Diagnosis present

## 2024-02-12 DIAGNOSIS — R2681 Unsteadiness on feet: Secondary | ICD-10-CM | POA: Insufficient documentation

## 2024-03-02 ENCOUNTER — Ambulatory Visit (HOSPITAL_COMMUNITY): Attending: Internal Medicine

## 2024-03-05 ENCOUNTER — Ambulatory Visit (HOSPITAL_COMMUNITY)

## 2024-03-09 ENCOUNTER — Ambulatory Visit (HOSPITAL_COMMUNITY)

## 2024-03-11 ENCOUNTER — Ambulatory Visit (HOSPITAL_COMMUNITY)

## 2024-03-16 ENCOUNTER — Ambulatory Visit (HOSPITAL_COMMUNITY)

## 2024-03-18 ENCOUNTER — Ambulatory Visit (HOSPITAL_COMMUNITY)

## 2024-03-23 ENCOUNTER — Ambulatory Visit (HOSPITAL_COMMUNITY)

## 2024-03-25 ENCOUNTER — Ambulatory Visit (HOSPITAL_COMMUNITY)

## 2024-03-30 ENCOUNTER — Ambulatory Visit (HOSPITAL_COMMUNITY)

## 2024-04-01 ENCOUNTER — Ambulatory Visit (HOSPITAL_COMMUNITY)

## 2024-04-06 ENCOUNTER — Ambulatory Visit (HOSPITAL_COMMUNITY)

## 2024-04-08 ENCOUNTER — Ambulatory Visit (HOSPITAL_COMMUNITY)
